# Patient Record
Sex: Male | Born: 1955
Health system: Southern US, Community
[De-identification: ages and names within clinical notes are randomized; demographics above are authoritative.]

## PROBLEM LIST (undated history)

## (undated) DIAGNOSIS — Z9889 Other specified postprocedural states: Secondary | ICD-10-CM

## (undated) DIAGNOSIS — I10 Essential (primary) hypertension: Secondary | ICD-10-CM

## (undated) DIAGNOSIS — E7800 Pure hypercholesterolemia, unspecified: Secondary | ICD-10-CM

## (undated) DIAGNOSIS — F329 Major depressive disorder, single episode, unspecified: Secondary | ICD-10-CM

## (undated) DIAGNOSIS — Z8701 Personal history of pneumonia (recurrent): Secondary | ICD-10-CM

## (undated) DIAGNOSIS — S3993XA Unspecified injury of pelvis, initial encounter: Secondary | ICD-10-CM

## (undated) DIAGNOSIS — Z8781 Personal history of (healed) traumatic fracture: Secondary | ICD-10-CM

## (undated) DIAGNOSIS — F32A Depression, unspecified: Secondary | ICD-10-CM

## (undated) DIAGNOSIS — G4730 Sleep apnea, unspecified: Secondary | ICD-10-CM

## (undated) HISTORY — PX: FRACTURE SURGERY: SHX138

## (undated) HISTORY — DX: Sleep apnea, unspecified: G47.30

## (undated) HISTORY — DX: Major depressive disorder, single episode, unspecified: F32.9

## (undated) HISTORY — DX: Unspecified injury of pelvis, initial encounter: S39.93XA

## (undated) HISTORY — DX: Other specified postprocedural states: Z98.890

## (undated) HISTORY — DX: Pure hypercholesterolemia, unspecified: E78.00

## (undated) HISTORY — DX: Personal history of pneumonia (recurrent): Z87.01

## (undated) HISTORY — DX: Depression, unspecified: F32.A

## (undated) HISTORY — DX: Personal history of (healed) traumatic fracture: Z87.81

## (undated) HISTORY — DX: Essential (primary) hypertension: I10

## (undated) HISTORY — PX: JOINT REPLACEMENT: SHX530

---
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
## 1976-12-07 HISTORY — PX: KNEE SURGERY: SHX244

## 1995-12-08 DIAGNOSIS — S3993XA Unspecified injury of pelvis, initial encounter: Secondary | ICD-10-CM

## 1995-12-08 HISTORY — DX: Unspecified injury of pelvis, initial encounter: S39.93XA

## 1996-12-07 DIAGNOSIS — Z8701 Personal history of pneumonia (recurrent): Secondary | ICD-10-CM

## 1996-12-07 HISTORY — DX: Personal history of pneumonia (recurrent): Z87.01

## 1998-12-07 HISTORY — PX: HAND SURGERY: SHX662

## 2005-12-07 HISTORY — PX: KNEE SURGERY: SHX244

## 2011-12-08 DIAGNOSIS — Z9889 Other specified postprocedural states: Secondary | ICD-10-CM

## 2011-12-08 HISTORY — DX: Other specified postprocedural states: Z98.890

## 2012-05-07 HISTORY — PX: COLONOSCOPY: SHX174

## 2015-10-09 ENCOUNTER — Ambulatory Visit (INDEPENDENT_AMBULATORY_CARE_PROVIDER_SITE_OTHER): Payer: BLUE CROSS/BLUE SHIELD | Admitting: Family Medicine

## 2015-10-09 VITALS — BP 122/78 | HR 79 | Temp 97.9°F | Resp 18 | Ht 78.0 in | Wt 353.0 lb

## 2015-10-09 DIAGNOSIS — S161XXA Strain of muscle, fascia and tendon at neck level, initial encounter: Secondary | ICD-10-CM | POA: Diagnosis not present

## 2015-10-09 DIAGNOSIS — R6889 Other general symptoms and signs: Secondary | ICD-10-CM | POA: Diagnosis not present

## 2015-10-09 DIAGNOSIS — L750 Bromhidrosis: Secondary | ICD-10-CM

## 2015-10-09 NOTE — Progress Notes (Signed)
Subjective:    Patient ID: Jason Ingram, male    DOB: 01-25-1956, 59 y.o.   MRN: [id number] This chart was scribed for Jason Staggers, MD by Jolene Provost, Medical Scribe. This patient was seen in Room 10 and the patient's care was started a 3:11 PM.  Chief Complaint  Patient presents with  . Personal Problem    wife has issues with bv and would like him to be checked her doctor suggested that he be seen    HPI HPI Comments: Jason Ingram is a 59 y.o. male who presents to [facility] complaining of possible STI exposure. He states his wife has been complaining of recurrent bacterial vaginosis with associated increased vaginal smell. She has also told him she has noticed a strong smell coming from his genitals. He uses axe body wash on a washcloth. He states he has been shaving his genitals to try to reduce smell. He also states that his wife has had some vaginal tearing when they have intercourse. He denies past hx of STIs. He denies current penile discharge, penile pain, burning with urination, abdominal pain, rashes or any other new sx.   He also states he has occasional urine leakage on the way to the bathroom. He denies any stool incontinence.   He also states he has been having some improving right neck pain for the last four days. He states he lifted a heavy chair onto his head four days ago and noticed the pain that evening.   There are no active problems to display for this patient.  Past Medical History  Diagnosis Date  . Depression   . Hypertension    Past Surgical History  Procedure Laterality Date  . Fracture surgery     No Known Allergies Prior to Admission medications   Medication Sig Start Date End Date Taking? Authorizing Provider  benzonatate (TESSALON) 200 MG capsule Take 200 mg by mouth daily.   Yes Historical Provider, MD  cloNIDine (CATAPRES) 0.3 MG tablet Take 0.3 mg by mouth daily.   Yes Historical Provider, MD  PARoxetine (PAXIL) 20 MG tablet Take 20 mg by mouth  daily.   Yes Historical Provider, MD  simvastatin (ZOCOR) 40 MG tablet Take 40 mg by mouth daily at 6 PM.   Yes Historical Provider, MD  tamsulosin (FLOMAX) 0.4 MG CAPS capsule Take 0.4 mg by mouth daily.   Yes Historical Provider, MD  testosterone (ANDROGEL) 50 MG/5GM (1%) GEL Place 5 g onto the skin daily.   Yes Historical Provider, MD  triamterene-hydrochlorothiazide (DYAZIDE) 37.5-25 MG capsule Take 1 capsule by mouth daily.   Yes Historical Provider, MD   Social History   Social History  . Marital Status: Married    Spouse Name: N/A  . Number of Children: N/A  . Years of Education: N/A   Occupational History  . Not on file.   Social History Main Topics  . Smoking status: Never Smoker   . Smokeless tobacco: Not on file  . Alcohol Use: 0.6 oz/week    1 Standard drinks or equivalent per week  . Drug Use: No  . Sexual Activity: Not on file   Other Topics Concern  . Not on file   Social History Narrative  . No narrative on file    Review of Systems  Constitutional: Negative for fever and chills.  Genitourinary: Negative for dysuria, urgency, frequency, discharge, penile swelling and penile pain.  Musculoskeletal: Positive for neck pain and neck stiffness.  Objective:   Physical Exam  Constitutional: He is oriented to person, place, and time. He appears well-developed and well-nourished. No distress.  HENT:  Head: Normocephalic and atraumatic.  Eyes: Pupils are equal, round, and reactive to light.  Neck: Neck supple.  Cardiovascular: Normal rate.   Pulmonary/Chest: Effort normal. No respiratory distress.  Genitourinary:  Shaved genitals, no external rash. No hyperpigmentation or rash in genital folds. There was a faint stool stain on underwear.  Musculoskeletal: Normal range of motion.  Neurological: He is alert and oriented to person, place, and time. Coordination normal.  Reflexes 2+ in biceps, triceps and brachioradialis. Strength 5/5 in bilateral hands.  Mild TTP along upper right trapezius. Slightly decreased rotation and lateral flexion in right neck.   Skin: Skin is warm and dry. He is not diaphoretic.  Psychiatric: He has a normal mood and affect. His behavior is normal.  Nursing note and vitals reviewed.   Filed Vitals:   10/09/15 1457  BP: 122/78  Pulse: 79  Temp: 97.9 F (36.6 C)  TempSrc: Oral  Resp: 18  Height: 6\' 6"  (1.981 m)  Weight: 353 lb (160.12 kg)  SpO2: 98%      Assessment & Plan:   Jason Ingram is a 59 y.o. male Strain of neck muscle, initial encounter  - no weakness, improving. No midline bony ttp. XR deferred at present. Sx care discussed, ROM, and handout given.   -rtc precautions   Abnormal body odor  - no concerning findings on exam.  May be a component of sweating in area throughout day and possibly some retained stool on skin.   -trial of change to milder soap such as Dove for Men, can change underwear during the day if needed, and can try flushable wipes to lessen retained stool on skin.   -rtc to look into other causes if persistent, and can discuss with urologist as well.   Meds ordered this encounter  Medications  . testosterone (ANDROGEL) 50 MG/5GM (1%) GEL    Sig: Place 5 g onto the skin daily.  . simvastatin (ZOCOR) 40 MG tablet    Sig: Take 40 mg by mouth daily at 6 PM.  . cloNIDine (CATAPRES) 0.3 MG tablet    Sig: Take 0.3 mg by mouth daily.  . tamsulosin (FLOMAX) 0.4 MG CAPS capsule    Sig: Take 0.4 mg by mouth daily.  Marland Kitchen. triamterene-hydrochlorothiazide (DYAZIDE) 37.5-25 MG capsule    Sig: Take 1 capsule by mouth daily.  . benzonatate (TESSALON) 200 MG capsule    Sig: Take 200 mg by mouth daily.  Marland Kitchen. PARoxetine (PAXIL) 20 MG tablet    Sig: Take 20 mg by mouth daily.   Patient Instructions  I do not see any concerns on your exam today.  You can try to change to [facility] to see if that helps, and use cleanings wipe after bowel movement. If you notice sweating throughout the day, can  try to change underwear once during the day to see if this helps. Return to the clinic or go to the nearest emergency room if any of your symptoms worsen or new symptoms occur.  For your neck - ibuprofen is ok short term, heat and gentle stretches/exercises. Recheck if not improving in next week or two.   Return to the clinic or go to the nearest emergency room if any of your symptoms worsen or new symptoms occur.  Cervical Sprain A cervical sprain is an injury in the neck in which the strong,  fibrous tissues (ligaments) that connect your neck bones stretch or tear. Cervical sprains can range from mild to severe. Severe cervical sprains can cause the neck vertebrae to be unstable. This can lead to damage of the spinal cord and can result in serious nervous system problems. The amount of time it takes for a cervical sprain to get better depends on the cause and extent of the injury. Most cervical sprains heal in 1 to 3 weeks. CAUSES  Severe cervical sprains may be caused by:   Contact sport injuries (such as from football, rugby, wrestling, hockey, auto racing, gymnastics, diving, martial arts, or boxing).   Motor vehicle collisions.   Whiplash injuries. This is an injury from a sudden forward and backward whipping movement of the head and neck.  Falls.  Mild cervical sprains may be caused by:   Being in an awkward position, such as while cradling a telephone between your ear and shoulder.   Sitting in a chair that does not offer proper support.   Working at a poorly Marketing executive station.   Looking up or down for long periods of time.  SYMPTOMS   Pain, soreness, stiffness, or a burning sensation in the front, back, or sides of the neck. This discomfort may develop immediately after the injury or slowly, 24 hours or more after the injury.   Pain or tenderness directly in the middle of the back of the neck.   Shoulder or upper back pain.   Limited ability to move the  neck.   Headache.   Dizziness.   Weakness, numbness, or tingling in the hands or arms.   Muscle spasms.   Difficulty swallowing or chewing.   Tenderness and swelling of the neck.  DIAGNOSIS  Most of the time your health care provider can diagnose a cervical sprain by taking your history and doing a physical exam. Your health care provider will ask about previous neck injuries and any known neck problems, such as arthritis in the neck. X-rays may be taken to find out if there are any other problems, such as with the bones of the neck. Other tests, such as a CT scan or MRI, may also be needed.  TREATMENT  Treatment depends on the severity of the cervical sprain. Mild sprains can be treated with rest, keeping the neck in place (immobilization), and pain medicines. Severe cervical sprains are immediately immobilized. Further treatment is done to help with pain, muscle spasms, and other symptoms and may include:  Medicines, such as pain relievers, numbing medicines, or muscle relaxants.   Physical therapy. This may involve stretching exercises, strengthening exercises, and posture training. Exercises and improved posture can help stabilize the neck, strengthen muscles, and help stop symptoms from returning.  HOME CARE INSTRUCTIONS   Put ice on the injured area.   Put ice in a plastic bag.   Place a towel between your skin and the bag.   Leave the ice on for 15-20 minutes, 3-4 times a day.   If your injury was severe, you may have been given a cervical collar to wear. A cervical collar is a two-piece collar designed to keep your neck from moving while it heals.  Do not remove the collar unless instructed by your health care provider.  If you have long hair, keep it outside of the collar.  Ask your health care provider before making any adjustments to your collar. Minor adjustments may be required over time to improve comfort and reduce pressure on your chin  or on the back  of your head.  Ifyou are allowed to remove the collar for cleaning or bathing, follow your health care provider's instructions on how to do so safely.  Keep your collar clean by wiping it with mild soap and water and drying it completely. If the collar you have been given includes removable pads, remove them every 1-2 days and hand wash them with soap and water. Allow them to air dry. They should be completely dry before you wear them in the collar.  If you are allowed to remove the collar for cleaning and bathing, wash and dry the skin of your neck. Check your skin for irritation or sores. If you see any, tell your health care provider.  Do not drive while wearing the collar.   Only take over-the-counter or prescription medicines for pain, discomfort, or fever as directed by your health care provider.   Keep all follow-up appointments as directed by your health care provider.   Keep all physical therapy appointments as directed by your health care provider.   Make any needed adjustments to your workstation to promote good posture.   Avoid positions and activities that make your symptoms worse.   Warm up and stretch before being active to help prevent problems.  SEEK MEDICAL CARE IF:   Your pain is not controlled with medicine.   You are unable to decrease your pain medicine over time as planned.   Your activity level is not improving as expected.  SEEK IMMEDIATE MEDICAL CARE IF:   You develop any bleeding.  You develop stomach upset.  You have signs of an allergic reaction to your medicine.   Your symptoms get worse.   You develop new, unexplained symptoms.   You have numbness, tingling, weakness, or paralysis in any part of your body.  MAKE SURE YOU:   Understand these instructions.  Will watch your condition.  Will get help right away if you are not doing well or get worse.   This information is not intended to replace advice given to you by your  health care provider. Make sure you discuss any questions you have with your health care provider.   Document Released: 09/20/2007 Document Revised: 11/28/2013 Document Reviewed: 05/31/2013 Elsevier Interactive Patient Education [employer]     I personally performed the services described in this documentation, which was scribed in my presence. The recorded information has been reviewed and considered, and addended by me as needed.   By signing my name below, I, Javier Docker, attest that this documentation has been prepared under the direction and in the presence of Jason Staggers, MD. Electronically Signed: Javier Docker, ER Scribe. 10/09/2015. 3:12 PM.

## 2015-10-09 NOTE — Addendum Note (Signed)
Addended by: Meredith StaggersGREENE, Anvay Tennis R on: 10/09/2015 03:46 PM   Modules accepted: Level of Service

## 2015-10-09 NOTE — Patient Instructions (Signed)
I do not see any concerns on your exam today.  You can try to change to [facility] to see if that helps, and use cleanings wipe after bowel movement. If you notice sweating throughout the day, can try to change underwear once during the day to see if this helps. Return to the clinic or go to the nearest emergency room if any of your symptoms worsen or new symptoms occur.  For your neck - ibuprofen is ok short term, heat and gentle stretches/exercises. Recheck if not improving in next week or two.   Return to the clinic or go to the nearest emergency room if any of your symptoms worsen or new symptoms occur.  Cervical Sprain A cervical sprain is an injury in the neck in which the strong, fibrous tissues (ligaments) that connect your neck bones stretch or tear. Cervical sprains can range from mild to severe. Severe cervical sprains can cause the neck vertebrae to be unstable. This can lead to damage of the spinal cord and can result in serious nervous system problems. The amount of time it takes for a cervical sprain to get better depends on the cause and extent of the injury. Most cervical sprains heal in 1 to 3 weeks. CAUSES  Severe cervical sprains may be caused by:   Contact sport injuries (such as from football, rugby, wrestling, hockey, auto racing, gymnastics, diving, martial arts, or boxing).   Motor vehicle collisions.   Whiplash injuries. This is an injury from a sudden forward and backward whipping movement of the head and neck.  Falls.  Mild cervical sprains may be caused by:   Being in an awkward position, such as while cradling a telephone between your ear and shoulder.   Sitting in a chair that does not offer proper support.   Working at a poorly Marketing executivedesigned computer station.   Looking up or down for long periods of time.  SYMPTOMS   Pain, soreness, stiffness, or a burning sensation in the front, back, or sides of the neck. This discomfort may develop immediately  after the injury or slowly, 24 hours or more after the injury.   Pain or tenderness directly in the middle of the back of the neck.   Shoulder or upper back pain.   Limited ability to move the neck.   Headache.   Dizziness.   Weakness, numbness, or tingling in the hands or arms.   Muscle spasms.   Difficulty swallowing or chewing.   Tenderness and swelling of the neck.  DIAGNOSIS  Most of the time your health care provider can diagnose a cervical sprain by taking your history and doing a physical exam. Your health care provider will ask about previous neck injuries and any known neck problems, such as arthritis in the neck. X-rays may be taken to find out if there are any other problems, such as with the bones of the neck. Other tests, such as a CT scan or MRI, may also be needed.  TREATMENT  Treatment depends on the severity of the cervical sprain. Mild sprains can be treated with rest, keeping the neck in place (immobilization), and pain medicines. Severe cervical sprains are immediately immobilized. Further treatment is done to help with pain, muscle spasms, and other symptoms and may include:  Medicines, such as pain relievers, numbing medicines, or muscle relaxants.   Physical therapy. This may involve stretching exercises, strengthening exercises, and posture training. Exercises and improved posture can help stabilize the neck, strengthen muscles, and help  stop symptoms from returning.  HOME CARE INSTRUCTIONS   Put ice on the injured area.   Put ice in a plastic bag.   Place a towel between your skin and the bag.   Leave the ice on for 15-20 minutes, 3-4 times a day.   If your injury was severe, you may have been given a cervical collar to wear. A cervical collar is a two-piece collar designed to keep your neck from moving while it heals.  Do not remove the collar unless instructed by your health care provider.  If you have long hair, keep it outside of  the collar.  Ask your health care provider before making any adjustments to your collar. Minor adjustments may be required over time to improve comfort and reduce pressure on your chin or on the back of your head.  Ifyou are allowed to remove the collar for cleaning or bathing, follow your health care provider's instructions on how to do so safely.  Keep your collar clean by wiping it with mild soap and water and drying it completely. If the collar you have been given includes removable pads, remove them every 1-2 days and hand wash them with soap and water. Allow them to air dry. They should be completely dry before you wear them in the collar.  If you are allowed to remove the collar for cleaning and bathing, wash and dry the skin of your neck. Check your skin for irritation or sores. If you see any, tell your health care provider.  Do not drive while wearing the collar.   Only take over-the-counter or prescription medicines for pain, discomfort, or fever as directed by your health care provider.   Keep all follow-up appointments as directed by your health care provider.   Keep all physical therapy appointments as directed by your health care provider.   Make any needed adjustments to your workstation to promote good posture.   Avoid positions and activities that make your symptoms worse.   Warm up and stretch before being active to help prevent problems.  SEEK MEDICAL CARE IF:   Your pain is not controlled with medicine.   You are unable to decrease your pain medicine over time as planned.   Your activity level is not improving as expected.  SEEK IMMEDIATE MEDICAL CARE IF:   You develop any bleeding.  You develop stomach upset.  You have signs of an allergic reaction to your medicine.   Your symptoms get worse.   You develop new, unexplained symptoms.   You have numbness, tingling, weakness, or paralysis in any part of your body.  MAKE SURE YOU:    Understand these instructions.  Will watch your condition.  Will get help right away if you are not doing well or get worse.   This information is not intended to replace advice given to you by your health care provider. Make sure you discuss any questions you have with your health care provider.   Document Released: 09/20/2007 Document Revised: 11/28/2013 Document Reviewed: 05/31/2013 Elsevier Interactive Patient Education [employer]

## 2016-01-31 ENCOUNTER — Encounter: Payer: Self-pay | Admitting: Internal Medicine

## 2016-01-31 ENCOUNTER — Ambulatory Visit (INDEPENDENT_AMBULATORY_CARE_PROVIDER_SITE_OTHER): Payer: BLUE CROSS/BLUE SHIELD | Admitting: Internal Medicine

## 2016-01-31 VITALS — BP 142/102 | HR 64 | Temp 97.6°F | Resp 20 | Ht 77.5 in | Wt 354.4 lb

## 2016-01-31 DIAGNOSIS — E291 Testicular hypofunction: Secondary | ICD-10-CM

## 2016-01-31 DIAGNOSIS — M2550 Pain in unspecified joint: Secondary | ICD-10-CM

## 2016-01-31 DIAGNOSIS — F329 Major depressive disorder, single episode, unspecified: Secondary | ICD-10-CM | POA: Diagnosis not present

## 2016-01-31 DIAGNOSIS — I10 Essential (primary) hypertension: Secondary | ICD-10-CM | POA: Diagnosis not present

## 2016-01-31 DIAGNOSIS — E349 Endocrine disorder, unspecified: Secondary | ICD-10-CM

## 2016-01-31 DIAGNOSIS — Z23 Encounter for immunization: Secondary | ICD-10-CM

## 2016-01-31 DIAGNOSIS — F32A Depression, unspecified: Secondary | ICD-10-CM

## 2016-01-31 MED ORDER — CLONIDINE HCL 0.3 MG PO TABS: 0.3000 mg | ORAL_TABLET | Freq: Every day | ORAL | Status: DC

## 2016-01-31 MED ORDER — ZOSTER VACCINE LIVE 19400 UNT/0.65ML ~~LOC~~ SOLR: 0.6500 mL | Freq: Once | SUBCUTANEOUS | Status: DC

## 2016-01-31 MED ORDER — TRIAMTERENE-HCTZ 37.5-25 MG PO CAPS: 1.0000 | ORAL_CAPSULE | Freq: Every day | ORAL | Status: DC

## 2016-01-31 NOTE — Patient Instructions (Addendum)
Will call with urology referral appt  Get old records  Recommend call to set up psychiatry appt. List of providers given  Follow up with pulmonary as scheduled. Call to be placed on cancellation list  Follow up in 3 mos for routine visit

## 2016-01-31 NOTE — Progress Notes (Signed)
Patient ID: Jason Ingram, male   DOB: 11-Oct-1956, 60 y.o.   MRN: [id number]    Location:    PAM   Place of Service:   OFFICE   Advanced Directive information Does patient have an advance directive?: No, Would patient like information on creating an advanced directive?: Yes - Educational materials given  Chief Complaint  Patient presents with  . Establish Care    New Patient Establish Care  . Medical Management of Chronic Issues  . OTHER    Wife in room with patient  . Immunizations    would like script for Zoster and to get Pnuemonia shot     HPI:  60 yo male seen today as a new pt. He married last yr and relocated to [place] from [place]. Wife c/a his breathing and states he does not take a deep breath but exhales forcefully. No hx asthma but has sleep apnea. He has not seen a sleep specialist in > 31yr. He has an appt with pulmonary in April. He would like a portable machine also as he travels a lot. He has had PFTs in the last year he believes  He was seen by UC and told bradycardia and ECG changes. He refused ED vsit. He has an appt with cardio to eval.  Obesity - currently taking phentermine.   Chronic knee pain - he was told he has bone-on-bone. He would like to lose weight  Multiple joint pain - right shoulder, right ankle feels sore  Depression - takes 1/2 tab paxil due to c/a not being able to lose weight. He has taken med since 1990s. He does have anger outbursts. He would like to see psychiatrist.   Testosterone deficiency - uses androgel. He c/o ED sx's.  Followed by urology in [place] but he would like to transfer care to [facility] provider  Currently employed as an Art gallery manager, bldg bunkers for [employer]  Past Medical History  Diagnosis Date  . Depression   . Hypertension   . Sleep apnea   . High cholesterol   . History of pneumonia 1998  . History of broken nose   . Injury of pelvis 1997  . Hx of colonoscopy 2013    Past  Surgical History  Procedure Laterality Date  . Fracture surgery    . Knee surgery  12/07/1976    [facility]  . Hand surgery  2000    Thumb Repair  . Skin graft  1962    [facility]   . Knee surgery  2007    Patient Care Team: Kirt Boys, DO as PCP - General (Internal Medicine) Coralyn Helling, MD as Consulting Physician (Pulmonary Disease)  Social History   Social History  . Marital Status: Married    Spouse Name: N/A  . Number of Children: N/A  . Years of Education: N/A   Occupational History  . Not on file.   Social History Main Topics  . Smoking status: Never Smoker   . Smokeless tobacco: Never Used  . Alcohol Use: 0.6 oz/week    1 Standard drinks or equivalent per week     Comment: 2 drinks weekly  . Drug Use: No  . Sexual Activity: Not on file   Other Topics Concern  . Not on file   Social History Narrative   Diet: N/A   Caffeine: Yes    Married: Yes, 2016   House: House, 2 stories    Pets: 2 Dogs   Current/Past profession: Building services engineer  Exercise: Yes, daily   Living Will: No   DNR: No   POA/HPOA: No         reports that he has never smoked. He has never used smokeless tobacco. He reports that he drinks about 0.6 oz of alcohol per week. He reports that he does not use illicit drugs.  Family History  Problem Relation Age of Onset  . Bipolar disorder Son    Family Status  Relation Status Death Age  . Mother Deceased   . Father Deceased   . Sister Alive   . Daughter Alive   . Son Alive     Immunization History  Administered Date(s) Administered  . Influenza-Unspecified 11/07/2015  . Pneumococcal Conjugate-13 01/31/2016    No Known Allergies  Medications: Patient's Medications  New Prescriptions   No medications on file  Previous Medications   IBUPROFEN-FAMOTIDINE (DUEXIS) 800-26.6 MG TABS    Take by mouth. Take 1 tablet by mouth every 8 hours as needed   MULTIPLE VITAMINS-MINERALS (CENTRUM SILVER ADULT 50+) TABS    Take by  mouth daily.   PAROXETINE (PAXIL) 20 MG TABLET    Take 20 mg by mouth daily.   PHENTERMINE 37.5 MG CAPSULE    Take 37.5 mg by mouth every morning.   SIMVASTATIN (ZOCOR) 40 MG TABLET    Take 40 mg by mouth at bedtime. For cholesterol   TAMSULOSIN (FLOMAX) 0.4 MG CAPS CAPSULE    Take 0.4 mg by mouth daily.   TESTOSTERONE (ANDROGEL) 20.25 MG/1.25GM (1.62%) GEL    Place onto the skin. Use once daily   ZOSTER VACCINE LIVE, PF, (ZOSTAVAX) 69629 UNT/0.65ML INJECTION    Inject 0.65 mLs into the skin once.  Modified Medications   Modified Medication Previous Medication   CLONIDINE (CATAPRES) 0.3 MG TABLET cloNIDine (CATAPRES) 0.3 MG tablet      Take 1 tablet (0.3 mg total) by mouth daily.    Take 0.3 mg by mouth daily.   TRIAMTERENE-HYDROCHLOROTHIAZIDE (DYAZIDE) 37.5-25 MG CAPSULE triamterene-hydrochlorothiazide (DYAZIDE) 37.5-25 MG capsule      Take 1 each (1 capsule total) by mouth daily.    Take 1 capsule by mouth daily.  Discontinued Medications   No medications on file    Review of Systems  Constitutional: Positive for fatigue.  Respiratory: Positive for apnea and wheezing.   Musculoskeletal: Positive for arthralgias and gait problem.  Psychiatric/Behavioral: Positive for sleep disturbance and dysphoric mood.  All other systems reviewed and are negative.   Filed Vitals:   01/31/16 1054  BP: 142/102  Pulse: 64  Temp: 97.6 F (36.4 C)  TempSrc: Oral  Resp: 20  Height: 6' 5.5" (1.969 m)  Weight: 354 lb 6.4 oz (160.755 kg)  SpO2: 96%   Body mass index is 41.46 kg/(m^2).  Physical Exam  Constitutional: He is oriented to person, place, and time. He appears well-developed and well-nourished.  HENT:  Mouth/Throat: Oropharynx is clear and moist.  Eyes: Pupils are equal, round, and reactive to light. No scleral icterus.  Neck: Neck supple. Carotid bruit is not present. No thyromegaly present.  Cardiovascular: Normal rate, regular rhythm, normal heart sounds and intact distal pulses.   Exam reveals no gallop and no friction rub.   No murmur heard. no distal LE swelling. No calf TTP  Pulmonary/Chest: Effort normal and breath sounds normal. He has no wheezes. He has no rales. He exhibits no tenderness.  Abdominal: Soft. Bowel sounds are normal. He exhibits no distension, no abdominal bruit, no pulsatile midline mass  and no mass. There is no tenderness. There is no rebound and no guarding.  Lymphadenopathy:    He has no cervical adenopathy.  Neurological: He is alert and oriented to person, place, and time. He has normal reflexes.  Skin: Skin is warm and dry. No rash noted.  Psychiatric: He has a normal mood and affect. His behavior is normal. Judgment and thought content normal.     Labs reviewed: No results found for any previous visit.  No results found.   Assessment/Plan   ICD-9-CM ICD-10-CM   1. Essential hypertension- suboptimally controlled 401.9 I10   2. Morbid obesity due to excess calories (HCC) 278.01 E66.01   3. Depression 311 F32.9   4. Testosterone deficiency 257.2 E29.1 Ambulatory referral to Urology  5. Pain, joint, multiple sites 719.49 M25.50    Will call with urology referral appt  Recommend stop phentermine as it may increase BP. He is not interested in seeing bariatric specialist at this time  Get old records  Recommend call to set up psychiatry appt. List of providers given  Follow up with pulmonary as scheduled. Call to be placed on cancellation list  Follow up in 3 mos for routine visit  Aubrianne Molyneux S. Ancil Linsey  [facility] [address] [place], [place] [address] [phone] Cell (Monday-Friday 8 AM - 5 PM) [phone] After 5 PM and follow prompts

## 2016-03-25 ENCOUNTER — Institutional Professional Consult (permissible substitution): Payer: Self-pay | Admitting: Pulmonary Disease

## 2016-05-01 ENCOUNTER — Ambulatory Visit: Payer: BLUE CROSS/BLUE SHIELD | Admitting: Internal Medicine

## 2016-05-08 ENCOUNTER — Ambulatory Visit (INDEPENDENT_AMBULATORY_CARE_PROVIDER_SITE_OTHER): Payer: BLUE CROSS/BLUE SHIELD | Admitting: Internal Medicine

## 2016-05-08 ENCOUNTER — Encounter: Payer: Self-pay | Admitting: Internal Medicine

## 2016-05-08 VITALS — BP 140/90 | HR 73 | Temp 97.8°F | Ht 78.0 in | Wt 338.6 lb

## 2016-05-08 DIAGNOSIS — M25569 Pain in unspecified knee: Secondary | ICD-10-CM | POA: Diagnosis not present

## 2016-05-08 DIAGNOSIS — M2550 Pain in unspecified joint: Secondary | ICD-10-CM | POA: Diagnosis not present

## 2016-05-08 DIAGNOSIS — F329 Major depressive disorder, single episode, unspecified: Secondary | ICD-10-CM

## 2016-05-08 DIAGNOSIS — I10 Essential (primary) hypertension: Secondary | ICD-10-CM | POA: Diagnosis not present

## 2016-05-08 DIAGNOSIS — F32A Depression, unspecified: Secondary | ICD-10-CM

## 2016-05-08 MED ORDER — IBUPROFEN-FAMOTIDINE 800-26.6 MG PO TABS: 1.0000 | ORAL_TABLET | Freq: Three times a day (TID) | ORAL | Status: DC | PRN

## 2016-05-08 MED ORDER — PHENTERMINE HCL 37.5 MG PO CAPS: 37.5000 mg | ORAL_CAPSULE | ORAL | Status: DC

## 2016-05-08 NOTE — Patient Instructions (Signed)
Continue current medications as ordered  Follow up with specialists as scheduled  Will call with lab results  Follow up in 3 mos for routine visit 

## 2016-05-08 NOTE — Progress Notes (Signed)
Patient ID: Teagon Kron, male   DOB: Dec 15, 1955, 60 y.o.   MRN: [id number]    Location:  PAM Place of Service: OFFICE  Chief Complaint  Patient presents with  . Medical Management of Chronic Issues    3 month follow up    HPI:  60 yo male seen today for f/u. He has been traveling a lot across the country and plans to go to [place] in next few weeks  Obesity - currently taking phentermine. He would like to go to a diet center in the [place] area. Weight down 16 lbs since last OV  Chronic knee pain - he was told he has bone-on-bone. He would like to lose weight before considering sx. He takes ibuprofen-famotidine prn. Needs RF  Multiple joint pain - right shoulder, right ankle feels sore, knee pain  Depression - takes 1/2 tab paxil due to c/a not being able to lose weight. He has taken med since 1990s. He does have anger outbursts. He has an appt with psych to discuss switching med to one that causes less weight gain  Testosterone deficiency - uses androgel. He c/o ED sx's.  Followed by urology in [place]. 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 He uses CPAP qhs   Past Medical History  Diagnosis Date  . Depression   . Hypertension   . Sleep apnea   . High cholesterol   . History of pneumonia 1998  . History of broken nose   . Injury of pelvis 1997  . Hx of colonoscopy 2013    Past Surgical History  Procedure Laterality Date  . Fracture surgery    . Knee surgery  12/07/1976    [facility]  . Hand surgery  2000    Thumb Repair  . Skin graft  1962    [facility]   . Knee surgery  2007  . Colonoscopy  05/2012    Dr. Carlisle Cater    Patient Care Team: Kirt Boys, DO as PCP - General (Internal Medicine) Coralyn Helling, MD as Consulting Physician (Pulmonary Disease)  Social History   Social History  . Marital Status: Married      Spouse Name: N/A  . Number of Children: N/A  . Years of Education: N/A   Occupational History  . Not on file.   Social History Main Topics  . Smoking status: Never Smoker   . Smokeless tobacco: Never Used  . Alcohol Use: 0.6 oz/week    1 Standard drinks or equivalent per week     Comment: 2 drinks weekly  . Drug Use: No  . Sexual Activity: Not on file   Other Topics Concern  . Not on file   Social History Narrative   Diet: N/A   Caffeine: Yes    Married: Yes, 2016   House: House, 2 stories    Pets: 2 Dogs   Current/Past profession: Building services engineer    Exercise: Yes, daily   Living Will: No   DNR: No   POA/HPOA: No         reports that he has never smoked. He has never used smokeless tobacco. He reports that he drinks about 0.6 oz of alcohol per week. He reports that he does not use illicit drugs.  Family History  Problem Relation Age of Onset  . Bipolar disorder Son  Family Status  Relation Status Death Age  . Mother Deceased   . Father Deceased   . Sister Alive   . Daughter Alive   . Son Alive      No Known Allergies  Medications: Patient's Medications  New Prescriptions   No medications on file  Previous Medications   CLONIDINE (CATAPRES) 0.3 MG TABLET    Take 1 tablet (0.3 mg total) by mouth daily.   IBUPROFEN-FAMOTIDINE (DUEXIS) 800-26.6 MG TABS    Take by mouth. Take 1 tablet by mouth every 8 hours as needed   MULTIPLE VITAMINS-MINERALS (CENTRUM SILVER ADULT 50+) TABS    Take by mouth daily.   PAROXETINE (PAXIL) 20 MG TABLET    Take 20 mg by mouth daily.   PHENTERMINE 37.5 MG CAPSULE    Take 37.5 mg by mouth every morning.   SIMVASTATIN (ZOCOR) 40 MG TABLET    Take 40 mg by mouth at bedtime. For cholesterol   TAMSULOSIN (FLOMAX) 0.4 MG CAPS CAPSULE    Take 0.4 mg by mouth daily.   TESTOSTERONE (ANDROGEL) 20.25 MG/1.25GM (1.62%) GEL    Place onto the skin. Use once daily   TRIAMTERENE-HYDROCHLOROTHIAZIDE (DYAZIDE) 37.5-25 MG CAPSULE     Take 1 each (1 capsule total) by mouth daily.  Modified Medications   No medications on file  Discontinued Medications   ZOSTER VACCINE LIVE, PF, (ZOSTAVAX) 1610919400 UNT/0.65ML INJECTION    Inject 19,400 Units into the skin once.    Review of Systems  Constitutional: Positive for fatigue.  Respiratory: Positive for apnea and wheezing.   Musculoskeletal: Positive for arthralgias and gait problem.  Psychiatric/Behavioral: Positive for sleep disturbance and dysphoric mood.  All other systems reviewed and are negative.   Filed Vitals:   05/08/16 1526  BP: 140/90  Pulse: 73  Temp: 97.8 F (36.6 C)  TempSrc: Oral  Height: 6\' 6"  (1.981 m)  Weight: 338 lb 9.6 oz (153.588 kg)  SpO2: 97%   Body mass index is 39.14 kg/(m^2).  Physical Exam  Constitutional: He is oriented to person, place, and time. He appears well-developed and well-nourished.  HENT:  Mouth/Throat: Oropharynx is clear and moist.  Eyes: Pupils are equal, round, and reactive to light. No scleral icterus.  Neck: Neck supple. Carotid bruit is not present. No thyromegaly present.  Cardiovascular: Normal rate, regular rhythm, normal heart sounds and intact distal pulses.  Exam reveals no gallop and no friction rub.   No murmur heard. no distal LE swelling. No calf TTP  Pulmonary/Chest: Effort normal and breath sounds normal. He has no wheezes. He has no rales. He exhibits no tenderness.  Abdominal: Soft. Bowel sounds are normal. He exhibits no distension, no abdominal bruit, no pulsatile midline mass and no mass. There is no tenderness. There is no rebound and no guarding.  Musculoskeletal: He exhibits edema and tenderness.  Lymphadenopathy:    He has no cervical adenopathy.  Neurological: He is alert and oriented to person, place, and time. He has normal reflexes.  Skin: Skin is warm and dry. No rash noted.  Psychiatric: He has a normal mood and affect. His behavior is normal. Judgment and thought content normal.      Labs reviewed: No results found for any previous visit.  No results found.   Assessment/Plan   ICD-9-CM ICD-10-CM   1. Knee pain, unspecified laterality 719.46 M25.569 Ibuprofen-Famotidine (DUEXIS) 800-26.6 MG TABS  2. Morbid obesity due to excess calories (HCC) 278.01 E66.01 phentermine 37.5 MG capsule  3. Essential hypertension  401.9 I10 CMP  4. Depression 311 F32.9   5. Pain, joint, multiple sites 719.49 M25.50 Ibuprofen-Famotidine (DUEXIS) 800-26.6 MG TABS   Continue current medications as ordered  Follow up with specialists as scheduled  Will call with lab results  Cont diet and exercise routine  Follow up in 3 mos for routine visit   Bobie Caris S. Ancil Linsey  [facility] [address] [place], [place] [address] [phone] Cell (Monday-Friday 8 AM - 5 PM) [phone] After 5 PM and follow prompts

## 2016-05-09 LAB — COMPREHENSIVE METABOLIC PANEL
A/G RATIO: 1.7 (ref 1.2–2.2)
ALT: 19 IU/L (ref 0–44)
AST: 18 IU/L (ref 0–40)
Albumin: 4.4 g/dL (ref 3.6–4.8)
Alkaline Phosphatase: 65 IU/L (ref 39–117)
BUN/Creatinine Ratio: 19 (ref 10–24)
BUN: 17 mg/dL (ref 8–27)
Bilirubin Total: 0.4 mg/dL (ref 0.0–1.2)
CHLORIDE: 98 mmol/L (ref 96–106)
CO2: 27 mmol/L (ref 18–29)
CREATININE: 0.88 mg/dL (ref 0.76–1.27)
Calcium: 9.7 mg/dL (ref 8.6–10.2)
GFR calc non Af Amer: 93 mL/min/{1.73_m2} (ref >59)
GFR, EST AFRICAN AMERICAN: 107 mL/min/{1.73_m2} (ref >59)
GLOBULIN, TOTAL: 2.6 g/dL (ref 1.5–4.5)
Glucose: 90 mg/dL (ref 65–99)
Potassium: 4.4 mmol/L (ref 3.5–5.2)
Sodium: 141 mmol/L (ref 134–144)
Total Protein: 7 g/dL (ref 6.0–8.5)

## 2016-06-01 DIAGNOSIS — F339 Major depressive disorder, recurrent, unspecified: Secondary | ICD-10-CM | POA: Diagnosis not present

## 2016-06-24 ENCOUNTER — Encounter: Payer: Self-pay | Admitting: Pulmonary Disease

## 2016-06-24 ENCOUNTER — Encounter (INDEPENDENT_AMBULATORY_CARE_PROVIDER_SITE_OTHER): Payer: Self-pay

## 2016-06-24 ENCOUNTER — Ambulatory Visit (INDEPENDENT_AMBULATORY_CARE_PROVIDER_SITE_OTHER): Payer: BLUE CROSS/BLUE SHIELD | Admitting: Pulmonary Disease

## 2016-06-24 ENCOUNTER — Other Ambulatory Visit (INDEPENDENT_AMBULATORY_CARE_PROVIDER_SITE_OTHER): Payer: BLUE CROSS/BLUE SHIELD

## 2016-06-24 ENCOUNTER — Ambulatory Visit (INDEPENDENT_AMBULATORY_CARE_PROVIDER_SITE_OTHER)
Admission: RE | Admit: 2016-06-24 | Discharge: 2016-06-24 | Disposition: A | Discharge status: Home / Self Care | Payer: BLUE CROSS/BLUE SHIELD | Source: Ambulatory Visit | Attending: Pulmonary Disease | Admitting: Pulmonary Disease

## 2016-06-24 VITALS — BP 138/86 | HR 60 | Ht 78.0 in | Wt 334.3 lb

## 2016-06-24 DIAGNOSIS — G4733 Obstructive sleep apnea (adult) (pediatric): Secondary | ICD-10-CM

## 2016-06-24 DIAGNOSIS — R0600 Dyspnea, unspecified: Secondary | ICD-10-CM

## 2016-06-24 LAB — COMPREHENSIVE METABOLIC PANEL WITH GFR
ALT: 13 U/L (ref 0–53)
AST: 12 U/L (ref 0–37)
Albumin: 4.1 g/dL (ref 3.5–5.2)
Alkaline Phosphatase: 55 U/L (ref 39–117)
BUN: 24 mg/dL — ABNORMAL HIGH (ref 6–23)
CO2: 33 meq/L — ABNORMAL HIGH (ref 19–32)
Calcium: 9.8 mg/dL (ref 8.4–10.5)
Chloride: 102 meq/L (ref 96–112)
Creatinine, Ser: 1.15 mg/dL (ref 0.40–1.50)
GFR: 68.67 mL/min
Glucose, Bld: 92 mg/dL (ref 70–99)
Potassium: 4.4 meq/L (ref 3.5–5.1)
Sodium: 140 meq/L (ref 135–145)
Total Bilirubin: 0.5 mg/dL (ref 0.2–1.2)
Total Protein: 7.2 g/dL (ref 6.0–8.3)

## 2016-06-24 LAB — CBC
HCT: 46.2 % (ref 39.0–52.0)
Hemoglobin: 15.1 g/dL (ref 13.0–17.0)
MCHC: 32.7 g/dL (ref 30.0–36.0)
MCV: 86.5 fl (ref 78.0–100.0)
Platelets: 272 K/uL (ref 150.0–400.0)
RBC: 5.34 Mil/uL (ref 4.22–5.81)
RDW: 14.5 % (ref 11.5–15.5)
WBC: 6.7 K/uL (ref 4.0–10.5)

## 2016-06-24 LAB — BRAIN NATRIURETIC PEPTIDE: Pro B Natriuretic peptide (BNP): 37 pg/mL (ref 0.0–100.0)

## 2016-06-24 NOTE — Progress Notes (Signed)
   Subjective:    Patient ID: Jason Ingram, male    DOB: Nov 16, 1956, 60 y.o.   MRN: [id number]  HPI    Review of Systems  Constitutional: Negative for fever and unexpected weight change.  HENT: Negative for congestion, dental problem, ear pain, nosebleeds, postnasal drip, rhinorrhea, sinus pressure, sneezing, sore throat and trouble swallowing.   Eyes: Negative for redness and itching.  Respiratory: Positive for shortness of breath. Negative for cough, chest tightness and wheezing.   Cardiovascular: Negative for palpitations and leg swelling.  Gastrointestinal: Negative for nausea and vomiting.  Genitourinary: Negative for dysuria.  Musculoskeletal: Negative for joint swelling.  Skin: Negative for rash.  Neurological: Negative for headaches.  Hematological: Does not bruise/bleed easily.  Psychiatric/Behavioral: Positive for dysphoric mood. The patient is not nervous/anxious.        Objective:   Physical Exam        Assessment & Plan:

## 2016-06-24 NOTE — Progress Notes (Signed)
Past Surgical History He  has past surgical history that includes Fracture surgery; Knee surgery (12/07/1976); Hand surgery (2000); Skin graft (1962); Knee surgery (2007); and Colonoscopy (05/2012).  No Known Allergies  Family History His family history includes Bipolar disorder in his son; Heart disease in his other; Lymphoma in his mother.  Social History He  reports that he has never smoked. He has never used smokeless tobacco. He reports that he drinks about 0.6 oz of alcohol per week. He reports that he does not use illicit drugs.  Review of systems Constitutional: Negative for fever and unexpected weight change.  HENT: Negative for congestion, dental problem, ear pain, nosebleeds, postnasal drip, rhinorrhea, sinus pressure, sneezing, sore throat and trouble swallowing.   Eyes: Negative for redness and itching.  Respiratory: Positive for shortness of breath. Negative for cough, chest tightness and wheezing.   Cardiovascular: Negative for palpitations and leg swelling.  Gastrointestinal: Negative for nausea and vomiting.  Genitourinary: Negative for dysuria.  Musculoskeletal: Negative for joint swelling.  Skin: Negative for rash.  Neurological: Negative for headaches.  Hematological: Does not bruise/bleed easily.  Psychiatric/Behavioral: Positive for dysphoric mood. The patient is not nervous/anxious.     Current Outpatient Prescriptions on File Prior to Visit  Medication Sig  . cloNIDine (CATAPRES) 0.3 MG tablet Take 1 tablet (0.3 mg total) by mouth daily.  . Ibuprofen-Famotidine (DUEXIS) 800-26.6 MG TABS Take 1 tablet by mouth every 8 (eight) hours as needed. Take 1 tablet by mouth every 8 hours as needed  . Multiple Vitamins-Minerals (CENTRUM SILVER ADULT 50+) TABS Take by mouth daily.  Marland Kitchen PARoxetine (PAXIL) 20 MG tablet Take 20 mg by mouth daily.  . phentermine 37.5 MG capsule Take 1 capsule (37.5 mg total) by mouth every morning.  . simvastatin (ZOCOR) 40 MG tablet Take 40 mg by  mouth at bedtime. For cholesterol  . tamsulosin (FLOMAX) 0.4 MG CAPS capsule Take 0.4 mg by mouth daily.  . Testosterone (ANDROGEL) 20.25 MG/1.25GM (1.62%) GEL Place onto the skin. Use once daily  . triamterene-hydrochlorothiazide (DYAZIDE) 37.5-25 MG capsule Take 1 each (1 capsule total) by mouth daily.   No current facility-administered medications on file prior to visit.    Chief Complaint  Patient presents with  . SLEEP CONSULT    Self Referral. Sleep study in [place] 2013. Current CPAP- pressure 10. Pt would like to swtich DME companies to someone in [place]. Current DME: [facility]    Tests: PSG 2005 >> AHI 50 CPAP 05/02/16 to 05/31/16 >> used on 30 of 30 nights with average 10 hrs 15 min.  Average AHI 1.7 with CPAP 10 cm H2O.  Past medical history He  has a past medical history of Depression; Hypertension; Sleep apnea; High cholesterol; History of pneumonia (1998); History of broken nose; Injury of pelvis (1997); and colonoscopy (2013).  Vital signs BP 138/86 mmHg  Pulse 60  Ht  (1.981 m)  Wt 334 lb 4.8 oz (151.637 kg)  BMI 38.64 kg/m2  SpO2 96%  History of Present Illness Jason Ingram is a 60 y.o. male for evaluation of sleep problems.  He was originally dx with OSA in 2005 with sleep study showing severe sleep apnea.  He lost about 60 lbs, and sleep apnea improved.  He regained weight and had another sleep study in [place] >> this showed sleep apnea and he was continued on CPAP.  He has been on CPAP 10 cm H2O.  He has nasal mask.  He moved to [place], and needs  new DME in [place].  He feels CPAP works well and helps his sleep.  He goes to sleep at 10 pm.  He falls asleep after 1 hour.  He wakes up a few times to use the bathroom.  He gets out of bed at 8 am.  He feels rested in the morning.  He denies morning headache.  He does not use anything to help him fall sleep or stay awake.  He denies sleep walking, sleep talking, bruxism, or nightmares.  There is  no history of restless legs.  He denies sleep hallucinations, sleep paralysis, or cataplexy.  The Epworth score is 11 out of 24.  His wife has been concerned about his breathing while awake and asleep.  She feels he is a shallow breather.  He will take big gasps of air in, but then not breath much air out.  He is not aware of this.  He denies chest pain, cough, sputum.  She hears him wheeze when he gets winded, but this gets better after he rests.  He denies leg swelling.  He never smoked cigarettes.  There is no history of asthma, or thrombo-embolic disease.  His wife feels that his breathing was better when he had lost weight.  Physical Exam:  General - No distress ENT - No sinus tenderness, no oral exudate, no LAN, no thyromegaly, TM clear, pupils equal/reactive, MP 3 Cardiac - s1s2 regular, no murmur, pulses symmetric Chest - No wheeze/rales/dullness, good air entry, normal respiratory excursion Back - No focal tenderness Abd - Soft, non-tender, no organomegaly, + bowel sounds Ext - No edema Neuro - Normal strength, cranial nerves intact Skin - No rashes Psych - Normal mood, and behavior  Discussion: He has hx of obstructive sleep apnea, and has done well with CPAP.  He needs new DME set up locally.    His wife has been concerned about his breathing, and shallow breathing both during the day and at night.  Assessment/plan:  Obstructive sleep apnea. - continue CPAP 10 cm H2O - will get copy of his previously sleep study - will arrange [place] new [place] in [place] >> explained he might need repeat sleep study for insurance purposes (could be done with home sleep study) - he travels extensively >> he would like to arrange for mini CPAP once he gets new DME established  Dyspnea. - labs, CXR, PFT - depending on results will determine if additional evaluation is needed  Obesity. - encouraged him to continue with weight loss efforts   Patient Instructions  Chest xray, lab tests  today Will arrange for pulmonary function test Will arrange for overnight oxygen test with CPAP Will arrange for new medical supply company  Follow up in 6 months     Coralyn HellingVineet Noris Kulinski, M.D. Pager [phone] 06/24/2016, 11:15 AM

## 2016-06-24 NOTE — Patient Instructions (Addendum)
Chest xray, lab tests today Will arrange for pulmonary function test Will arrange for overnight oxygen test with CPAP Will arrange for new medical supply company  Follow up in 6 months

## 2016-06-25 ENCOUNTER — Encounter: Payer: Self-pay | Admitting: Pulmonary Disease

## 2016-06-25 ENCOUNTER — Telehealth: Payer: Self-pay | Admitting: Pulmonary Disease

## 2016-06-25 DIAGNOSIS — G4733 Obstructive sleep apnea (adult) (pediatric): Secondary | ICD-10-CM | POA: Insufficient documentation

## 2016-06-25 NOTE — Telephone Encounter (Signed)
Dg Chest 2 View  06/24/2016  CLINICAL DATA:  Dyspnea. EXAM: CHEST  2 VIEW COMPARISON:  None FINDINGS: The heart size and mediastinal contours are within normal limits. Both lungs are clear. The visualized skeletal structures are unremarkable. IMPRESSION: No active cardiopulmonary disease. Electronically Signed   By: Elige KoHetal  Patel   On: 06/24/2016 13:47    CMP Latest Ref Rng 06/24/2016 05/08/2016  Glucose 70 - 99 mg/dL 92 90  BUN 6 - 23 mg/dL 16(X24(H) 17  Creatinine 0.960.40 - 1.50 mg/dL 0.451.15 4.090.88  Sodium 811135 - 145 mEq/L 140 141  Potassium 3.5 - 5.1 mEq/L 4.4 4.4  Chloride 96 - 112 mEq/L 102 98  CO2 19 - 32 mEq/L 33(H) 27  Calcium 8.4 - 10.5 mg/dL 9.8 9.7  Total Protein 6.0 - 8.3 g/dL 7.2 7.0  Total Bilirubin 0.2 - 1.2 mg/dL 0.5 0.4  Alkaline Phos 39 - 117 U/L 55 65  AST 0 - 37 U/L 12 18  ALT 0 - 53 U/L 13 19     CBC Latest Ref Rng 06/24/2016  WBC 4.0 - 10.5 K/uL 6.7  Hemoglobin 13.0 - 17.0 g/dL 91.415.1  Hematocrit 78.239.0 - 52.0 % 46.2  Platelets 150.0 - 400.0 K/uL 272.0    ProBNP (last 3 results)  Recent Labs  06/24/16 1135  PROBNP 37.0    Will have my nurse inform pt that chest xray and lab tests were normal.  Will call back after review of his breathing test.

## 2016-06-25 NOTE — Telephone Encounter (Signed)
Results have been explained to patient, pt expressed understanding. Nothing further needed.  

## 2016-06-30 ENCOUNTER — Other Ambulatory Visit: Payer: Self-pay

## 2016-06-30 MED ORDER — SIMVASTATIN 40 MG PO TABS: 40.0000 mg | ORAL_TABLET | Freq: Every day | ORAL | 1 refills | Status: DC

## 2016-06-30 MED ORDER — PAROXETINE HCL 20 MG PO TABS: 20.0000 mg | ORAL_TABLET | Freq: Every day | ORAL | 1 refills | Status: DC

## 2016-07-10 DIAGNOSIS — M1711 Unilateral primary osteoarthritis, right knee: Secondary | ICD-10-CM | POA: Diagnosis not present

## 2016-07-10 DIAGNOSIS — M1712 Unilateral primary osteoarthritis, left knee: Secondary | ICD-10-CM | POA: Diagnosis not present

## 2016-07-10 DIAGNOSIS — M170 Bilateral primary osteoarthritis of knee: Secondary | ICD-10-CM | POA: Diagnosis not present

## 2016-07-17 DIAGNOSIS — Z029 Encounter for administrative examinations, unspecified: Secondary | ICD-10-CM

## 2016-07-21 DIAGNOSIS — G4733 Obstructive sleep apnea (adult) (pediatric): Secondary | ICD-10-CM | POA: Diagnosis not present

## 2016-07-24 ENCOUNTER — Encounter: Payer: Self-pay | Admitting: Pulmonary Disease

## 2016-07-25 ENCOUNTER — Other Ambulatory Visit: Payer: Self-pay | Admitting: Internal Medicine

## 2016-08-06 DIAGNOSIS — Z125 Encounter for screening for malignant neoplasm of prostate: Secondary | ICD-10-CM | POA: Diagnosis not present

## 2016-08-06 DIAGNOSIS — E291 Testicular hypofunction: Secondary | ICD-10-CM | POA: Diagnosis not present

## 2016-08-06 DIAGNOSIS — R350 Frequency of micturition: Secondary | ICD-10-CM | POA: Diagnosis not present

## 2016-08-11 DIAGNOSIS — M79671 Pain in right foot: Secondary | ICD-10-CM | POA: Diagnosis not present

## 2016-08-11 DIAGNOSIS — M722 Plantar fascial fibromatosis: Secondary | ICD-10-CM | POA: Diagnosis not present

## 2016-08-11 DIAGNOSIS — M7731 Calcaneal spur, right foot: Secondary | ICD-10-CM | POA: Diagnosis not present

## 2016-08-12 ENCOUNTER — Encounter: Payer: Self-pay | Admitting: Internal Medicine

## 2016-08-12 ENCOUNTER — Ambulatory Visit (INDEPENDENT_AMBULATORY_CARE_PROVIDER_SITE_OTHER): Payer: BLUE CROSS/BLUE SHIELD | Admitting: Internal Medicine

## 2016-08-12 ENCOUNTER — Ambulatory Visit: Payer: BLUE CROSS/BLUE SHIELD | Admitting: Internal Medicine

## 2016-08-12 VITALS — BP 118/80 | HR 60 | Temp 98.0°F | Ht 78.0 in | Wt 328.2 lb

## 2016-08-12 DIAGNOSIS — I10 Essential (primary) hypertension: Secondary | ICD-10-CM | POA: Diagnosis not present

## 2016-08-12 DIAGNOSIS — M25569 Pain in unspecified knee: Secondary | ICD-10-CM | POA: Insufficient documentation

## 2016-08-12 DIAGNOSIS — F329 Major depressive disorder, single episode, unspecified: Secondary | ICD-10-CM | POA: Diagnosis not present

## 2016-08-12 DIAGNOSIS — M722 Plantar fascial fibromatosis: Secondary | ICD-10-CM | POA: Diagnosis not present

## 2016-08-12 DIAGNOSIS — E349 Endocrine disorder, unspecified: Secondary | ICD-10-CM | POA: Insufficient documentation

## 2016-08-12 DIAGNOSIS — F32A Depression, unspecified: Secondary | ICD-10-CM | POA: Insufficient documentation

## 2016-08-12 DIAGNOSIS — E291 Testicular hypofunction: Secondary | ICD-10-CM | POA: Diagnosis not present

## 2016-08-12 NOTE — Patient Instructions (Signed)
Continue current medications as ordered  Follow up with sleep specialist. May need repeat sleep study to assess efficacy of current sleep settings  Follow up with Ortho as scheduled  Continue PT as ordered  Follow up in 3 mos for routine visit

## 2016-08-12 NOTE — Progress Notes (Signed)
Patient ID: Jason Ingram, male   DOB: 28-Dec-1955, 60 y.o.   MRN: [id number]    Location:  [facility] Place of Service: OFFICE  Chief Complaint  Patient presents with  . Medical Management of Chronic Issues    3 month follow up    HPI:  60 yo male seen today for f/u. He developed right plantar fasciitis earlier this week and was seen by [facility] yesterday. He had right heel injection and rx topical analgesic. He now is wearing camwalker and started PT today. He is leaving for [place] later this weekend  Obesity - currently off phentermine. He is on strict diet and continues to lose weight. Weight down 6 lbs since last OV and probably more but he was weighed with camwalker. Wife present today  Chronic knee pain - he was told he has bone-on-bone. He would like to lose weight before considering sx. He takes ibuprofen-famotidine prn. Needs RF  Multiple joint pain - right shoulder, right ankle feels sore, knee pain  Depression - takes 1/2 tab paxil due to c/a not being able to lose weight. He has taken med since 1990s. He does have anger outbursts. He has an appt with psych to discuss switching med to one that causes less weight gain  Testosterone deficiency - uses androgel. He c/o ED sx's.  Followed by urology in [place]. [facility] July [facility]  Currently employed as an Art gallery manager, bldg bunkers for [employer]  Snoring/hx OSA - followed by sleep specialist. He uses CPAP qhs   Past Medical History:  Diagnosis Date  . Depression   . High cholesterol   . History of broken nose   . History of pneumonia 1998  . Hx of colonoscopy 2013  . Hypertension   . Injury of pelvis 1997  . Sleep apnea     Past Surgical History:  Procedure Laterality Date  . COLONOSCOPY  05/2012   Dr. Carlisle Cater  . FRACTURE SURGERY    . HAND SURGERY  2000   [facility]  . KNEE SURGERY  12/07/1976   [facility]  . KNEE SURGERY  2007  . SKIN GRAFT  1962   [facility]      Patient Care Team: Kirt Boys, DO as PCP - General (Internal Medicine) Coralyn Helling, MD as Consulting Physician (Pulmonary Disease)  Social History   Social History  . Marital status: Married    Spouse name: N/A  . Number of children: N/A  . Years of education: N/A   Occupational History  . Engineer    Social History Main Topics  . Smoking status: Never Smoker  . Smokeless tobacco: Never Used  . Alcohol use 0.6 oz/week    1 Standard drinks or equivalent per week     Comment: 4 drinks weekly  . Drug use: No  . Sexual activity: Not on file   Other Topics Concern  . Not on file   Social History Narrative   Diet: N/A   Caffeine: Yes    Married: Yes, 2016   House: House, 2 stories    Pets: 2 Dogs   Current/Past profession: Building services engineer    Exercise: Yes, daily   Living Will: No   DNR: No   POA/HPOA: No         reports that he has never smoked. He has never used smokeless tobacco. He reports that he drinks about 0.6 oz of alcohol per week . He reports that he  does not use drugs.  Family History  Problem Relation Age of Onset  . Lymphoma Mother   . Bipolar disorder Son   . Heart disease Other     multiple   Family Status  Relation Status  . Mother Deceased  . Father Deceased  . Sister Alive  . Daughter Alive  . Son Alive  . Son   . Other      No Known Allergies  Medications: Patient's Medications  New Prescriptions   No medications on file  Previous Medications   CLONIDINE (CATAPRES) 0.3 MG TABLET    TAKE 1 TABLET BY MOUTH DAILY   FINASTERIDE (PROSCAR) 5 MG TABLET    Take 5 mg by mouth daily.   IBUPROFEN-FAMOTIDINE (DUEXIS) 800-26.6 MG TABS    Take 1 tablet by mouth every 8 (eight) hours as needed. Take 1 tablet by mouth every 8 hours as needed   MULTIPLE VITAMINS-MINERALS (CENTRUM SILVER ADULT 50+) TABS    Take by mouth daily.   PAROXETINE (PAXIL) 20 MG TABLET    Take 1 tablet (20 mg total) by mouth daily.   SIMVASTATIN (ZOCOR) 40  MG TABLET    Take 1 tablet (40 mg total) by mouth at bedtime. For cholesterol   TRIAMTERENE-HYDROCHLOROTHIAZIDE (DYAZIDE) 37.5-25 MG CAPSULE    Take 1 each (1 capsule total) by mouth daily.  Modified Medications   No medications on file  Discontinued Medications   PHENTERMINE 37.5 MG CAPSULE    Take 1 capsule (37.5 mg total) by mouth every morning.   TAMSULOSIN (FLOMAX) 0.4 MG CAPS CAPSULE    Take 0.4 mg by mouth daily.   TESTOSTERONE (ANDROGEL) 20.25 MG/1.25GM (1.62%) GEL    Place onto the skin. Use once daily    Review of Systems  Constitutional: Positive for fatigue.  Musculoskeletal: Positive for arthralgias and gait problem.  Psychiatric/Behavioral: Positive for dysphoric mood and sleep disturbance.  All other systems reviewed and are negative.   Vitals:   08/12/16 1540  BP: 118/80  Pulse: 60  Temp: 98 F (36.7 C)  TempSrc: Oral  SpO2: 95%  Weight: (!) 328 lb 3.2 oz (148.9 kg)  Height: 6\' 6"  (1.981 m)   Body mass index is 37.93 kg/m.  Physical Exam  Constitutional: He is oriented to person, place, and time. He appears well-developed and well-nourished.  HENT:  Mouth/Throat: Oropharynx is clear and moist.  Eyes: Pupils are equal, round, and reactive to light. No scleral icterus.  Neck: Neck supple. Carotid bruit is not present. No thyromegaly present.  Cardiovascular: Normal rate, regular rhythm, normal heart sounds and intact distal pulses.  Exam reveals no gallop and no friction rub.   No murmur heard. no distal LE swelling. No calf TTP  Pulmonary/Chest: Effort normal and breath sounds normal. He has no wheezes. He has no rales. He exhibits no tenderness.  Abdominal: Soft. Bowel sounds are normal. He exhibits no distension, no abdominal bruit, no pulsatile midline mass and no mass. There is no tenderness. There is no rebound and no guarding.  Musculoskeletal: He exhibits edema and tenderness.  Right camwalker intact  Lymphadenopathy:    He has no cervical  adenopathy.  Neurological: He is alert and oriented to person, place, and time. He has normal reflexes.  Skin: Skin is warm and dry. No rash noted.  Right anterior leg abrasion  Psychiatric: He has a normal mood and affect. His behavior is normal. Judgment and thought content normal.     Labs reviewed: Appointment on 06/24/2016  Component Date Value Ref Range Status  . Sodium 06/24/2016 140  135 - 145 mEq/L Final  . Potassium 06/24/2016 4.4  3.5 - 5.1 mEq/L Final  . Chloride 06/24/2016 102  96 - 112 mEq/L Final  . CO2 06/24/2016 33* 19 - 32 mEq/L Final  . Glucose, Bld 06/24/2016 92  70 - 99 mg/dL Final  . BUN 08/65/784607/19/2017 24* 6 - 23 mg/dL Final  . Creatinine, Ser 06/24/2016 1.15  0.40 - 1.50 mg/dL Final  . Total Bilirubin 06/24/2016 0.5  0.2 - 1.2 mg/dL Final  . Alkaline Phosphatase 06/24/2016 55  39 - 117 U/L Final  . AST 06/24/2016 12  0 - 37 U/L Final  . ALT 06/24/2016 13  0 - 53 U/L Final  . Total Protein 06/24/2016 7.2  6.0 - 8.3 g/dL Final  . Albumin 96/29/528407/19/2017 4.1  3.5 - 5.2 g/dL Final  . Calcium 13/24/401007/19/2017 9.8  8.4 - 10.5 mg/dL Final  . GFR 27/25/366407/19/2017 68.67  >60.00 mL/min Final  . Pro B Natriuretic peptide (BNP) 06/24/2016 37.0  0.0 - 100.0 pg/mL Final  . WBC 06/24/2016 6.7  4.0 - 10.5 K/uL Final  . RBC 06/24/2016 5.34  4.22 - 5.81 Mil/uL Final  . Platelets 06/24/2016 272.0  150.0 - 400.0 K/uL Final  . Hemoglobin 06/24/2016 15.1  13.0 - 17.0 g/dL Final  . HCT 40/34/742507/19/2017 46.2  39.0 - 52.0 % Final  . MCV 06/24/2016 86.5  78.0 - 100.0 fl Final  . MCHC 06/24/2016 32.7  30.0 - 36.0 g/dL Final  . RDW 95/63/875607/19/2017 14.5  11.5 - 15.5 % Final    No results found.   Assessment/Plan   ICD-9-CM ICD-10-CM   1. Plantar fasciitis of right foot 728.71 M72.2   2. Morbid obesity due to excess calories (HCC) 278.01 E66.01   3. Essential hypertension 401.9 I10   4. Depression 311 F32.9   5. Knee pain, unspecified laterality 719.46 M25.569   6. Testosterone deficiency 257.2 E29.1   7.       Snoring/excessive daytime sleepiness likely due to uncontrolled sleep apnea  Wear long sock on right to reduce abrasions to leg and prevent pressure sore from forming  Continue current medications as ordered  Follow up with sleep specialist. May need repeat sleep study to assess efficacy of current sleep settings  Follow up with Ortho as scheduled  Continue PT as ordered  Follow up in 3 mos for routine visit  Carilyn Woolston S. Ancil Linseyarter, D. O., F. A. C. O. I.  [facility] [address] [place], [place] [address] [phone] Cell (Monday-Friday 8 AM - 5 PM) [phone] After 5 PM and follow prompts

## 2016-08-14 DIAGNOSIS — M722 Plantar fascial fibromatosis: Secondary | ICD-10-CM | POA: Diagnosis not present

## 2016-08-14 DIAGNOSIS — E291 Testicular hypofunction: Secondary | ICD-10-CM | POA: Diagnosis not present

## 2016-08-14 DIAGNOSIS — Z125 Encounter for screening for malignant neoplasm of prostate: Secondary | ICD-10-CM | POA: Diagnosis not present

## 2016-08-31 ENCOUNTER — Encounter: Payer: Self-pay | Admitting: Pulmonary Disease

## 2016-08-31 DIAGNOSIS — R0600 Dyspnea, unspecified: Secondary | ICD-10-CM | POA: Diagnosis not present

## 2016-09-02 ENCOUNTER — Telehealth: Payer: Self-pay | Admitting: Pulmonary Disease

## 2016-09-02 ENCOUNTER — Ambulatory Visit: Payer: BLUE CROSS/BLUE SHIELD | Admitting: Internal Medicine

## 2016-09-02 DIAGNOSIS — Z125 Encounter for screening for malignant neoplasm of prostate: Secondary | ICD-10-CM | POA: Diagnosis not present

## 2016-09-02 NOTE — Telephone Encounter (Signed)
Pt called in. Pt reports he took the finger probe off his finger 3 times last night to use the restroom. Each time he put it back on, he placed the finger probe on a different finger. He just wanted to let Dr. Craige CottaSood know in case his readings are inaccurate.  FYI to Dr. Craige CottaSood.

## 2016-09-02 NOTE — Telephone Encounter (Signed)
I spoke with patient about results and he verbalized understanding and had no questions 

## 2016-09-02 NOTE — Telephone Encounter (Signed)
ONO with CPAP 08/31/16 >> test time 7 hrs 39 min.  Baseline SpO2 92.8%, low SpO2 89%.    Will have my nurse inform pt that ONO looked good.  He does not need supplemental oxygen at night.  He needs to continue CPAP at night.  Using different fingers during the night while doing ONO will not have impact on test results.

## 2016-09-08 DIAGNOSIS — F339 Major depressive disorder, recurrent, unspecified: Secondary | ICD-10-CM | POA: Diagnosis not present

## 2016-09-10 DIAGNOSIS — M170 Bilateral primary osteoarthritis of knee: Secondary | ICD-10-CM | POA: Diagnosis not present

## 2016-09-10 DIAGNOSIS — M1712 Unilateral primary osteoarthritis, left knee: Secondary | ICD-10-CM | POA: Diagnosis not present

## 2016-09-12 ENCOUNTER — Other Ambulatory Visit: Payer: Self-pay | Admitting: Internal Medicine

## 2016-09-22 DIAGNOSIS — E291 Testicular hypofunction: Secondary | ICD-10-CM | POA: Diagnosis not present

## 2016-09-22 DIAGNOSIS — Z125 Encounter for screening for malignant neoplasm of prostate: Secondary | ICD-10-CM | POA: Diagnosis not present

## 2016-09-22 DIAGNOSIS — R350 Frequency of micturition: Secondary | ICD-10-CM | POA: Diagnosis not present

## 2016-09-25 DIAGNOSIS — Z23 Encounter for immunization: Secondary | ICD-10-CM | POA: Diagnosis not present

## 2016-10-08 ENCOUNTER — Ambulatory Visit: Payer: Self-pay | Admitting: Orthopedic Surgery

## 2016-10-17 ENCOUNTER — Other Ambulatory Visit: Payer: Self-pay | Admitting: Internal Medicine

## 2016-11-11 ENCOUNTER — Ambulatory Visit (INDEPENDENT_AMBULATORY_CARE_PROVIDER_SITE_OTHER): Payer: BLUE CROSS/BLUE SHIELD | Admitting: Internal Medicine

## 2016-11-11 ENCOUNTER — Encounter: Payer: Self-pay | Admitting: Internal Medicine

## 2016-11-11 DIAGNOSIS — M2550 Pain in unspecified joint: Secondary | ICD-10-CM | POA: Diagnosis not present

## 2016-11-11 DIAGNOSIS — F329 Major depressive disorder, single episode, unspecified: Secondary | ICD-10-CM | POA: Diagnosis not present

## 2016-11-11 DIAGNOSIS — I10 Essential (primary) hypertension: Secondary | ICD-10-CM | POA: Diagnosis not present

## 2016-11-11 DIAGNOSIS — M1711 Unilateral primary osteoarthritis, right knee: Secondary | ICD-10-CM

## 2016-11-11 DIAGNOSIS — F32A Depression, unspecified: Secondary | ICD-10-CM

## 2016-11-11 NOTE — Progress Notes (Signed)
Patient ID: Jason Ingram, male   DOB: 1956-11-09, 60 y.o.   MRN: [id number]    Location:  P[facility] Place of Service: OFFICE  Chief Complaint  Patient presents with  . Medical Management of Chronic Issues    3 months routine visit    HPI:  60 yo male seen today for f/u. He is scheduled for right TKR 12/28/16 with [facility] Dr Lequita Halt. He sold his house in [place] before Thanksgiving. No other concerns  He has right heel pain and was tx with PT.  Obesity - currently off phentermine. He is off his strict diet. Weight up 3 lbs since last OV.   Chronic knee pain - he was told he has bone-on-bone. He would like to lose weight before considering sx. He takes ibuprofen-famotidine prn.  Multiple joint pain - right shoulder, right ankle feels sore, knee pain  Depression - takes 10 mg paxil due to c/a not being able to lose weight. He has taken med since 1990s. He does have anger outbursts. Followed by psych.   Testosterone deficiency - uses androgel. He c/o ED sx's.  Followed by urology in [place]. 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 He uses CPAP qhs. He will have sleep study in Feb 2018    Past Medical History:  Diagnosis Date  . Depression   . High cholesterol   . History of broken nose   . History of pneumonia 1998  . Hx of colonoscopy 2013  . Hypertension   . Injury of pelvis 1997  . Sleep apnea     Past Surgical History:  Procedure Laterality Date  . COLONOSCOPY  05/2012   Dr. Carlisle Cater  . FRACTURE SURGERY    . HAND SURGERY  2000   [facility]  . KNEE SURGERY  12/07/1976   [facility]  . KNEE SURGERY  2007  . SKIN GRAFT  1962   [facility]     Patient Care Team: Kirt Boys, DO as PCP - General (Internal Medicine) Coralyn Helling, MD as Consulting Physician (Pulmonary Disease)  Social History   Social History  .  Marital status: Married    Spouse name: N/A  . Number of children: N/A  . Years of education: N/A   Occupational History  . Engineer    Social History Main Topics  . Smoking status: Never Smoker  . Smokeless tobacco: Never Used  . Alcohol use 0.6 oz/week    1 Standard drinks or equivalent per week     Comment: 4 drinks weekly  . Drug use: No  . Sexual activity: Not on file   Other Topics Concern  . Not on file   Social History Narrative   Diet: N/A   Caffeine: Yes    Married: Yes, 2016   House: House, 2 stories    Pets: 2 Dogs   Current/Past profession: Building services engineer    Exercise: Yes, daily   Living Will: No   DNR: No   POA/HPOA: No         reports that he has never smoked. He has never used smokeless tobacco. He reports that he drinks about 0.6 oz of alcohol per week . He reports that he does not use drugs.  Family History  Problem Relation Age of Onset  . Lymphoma Mother   . Bipolar disorder Son   . Heart disease  Other     multiple   Family Status  Relation Status  . Mother Deceased  . Father Deceased  . Sister Alive  . Daughter Alive  . Son Alive  . Son   . Other      No Known Allergies  Medications: Patient's Medications  New Prescriptions   No medications on file  Previous Medications   CLONIDINE (CATAPRES) 0.3 MG TABLET    TAKE 1 TABLET BY MOUTH DAILY   FINASTERIDE (PROSCAR) 5 MG TABLET    Take 5 mg by mouth daily.   IBUPROFEN-FAMOTIDINE (DUEXIS) 800-26.6 MG TABS    Take 1 tablet by mouth every 8 (eight) hours as needed. Take 1 tablet by mouth every 8 hours as needed   MULTIPLE VITAMINS-MINERALS (CENTRUM SILVER ADULT 50+) TABS    Take by mouth daily.   PAROXETINE (PAXIL) 20 MG TABLET    Take 1 tablet (20 mg total) by mouth daily.   SIMVASTATIN (ZOCOR) 40 MG TABLET    TAKE 1 TABLET(40 MG) BY MOUTH AT BEDTIME FOR CHOLESTEROL   TRIAMTERENE-HYDROCHLOROTHIAZIDE (DYAZIDE) 37.5-25 MG CAPSULE    TAKE 1 CAPSULE BY MOUTH DAILY  Modified  Medications   No medications on file  Discontinued Medications   No medications on file    Review of Systems  Vitals:   11/11/16 1137  BP: 118/80  Pulse: (!) 56  Temp: 97.7 F (36.5 C)  TempSrc: Oral  SpO2: 96%  Weight: (!) 331 lb 6.4 oz (150.3 kg)  Height: 6\' 6"  (1.981 m)   Body mass index is 38.3 kg/m.  Physical Exam  Constitutional: He is oriented to person, place, and time. He appears well-developed and well-nourished.  HENT:  Mouth/Throat: Oropharynx is clear and moist.  Eyes: Pupils are equal, round, and reactive to light. No scleral icterus.  Neck: Neck supple. Carotid bruit is not present. No thyromegaly present.  Cardiovascular: Normal rate, regular rhythm, normal heart sounds and intact distal pulses.  Exam reveals no gallop and no friction rub.   No murmur heard. no distal LE swelling. No calf TTP  Pulmonary/Chest: Effort normal and breath sounds normal. He has no wheezes. He has no rales. He exhibits no tenderness.  Abdominal: Soft. Bowel sounds are normal. He exhibits no distension, no abdominal bruit, no pulsatile midline mass and no mass. There is no hepatomegaly. There is no tenderness. There is no rebound and no guarding.  Morbidly obese  Musculoskeletal: He exhibits edema (R>L knee swelling) and tenderness (left medial knee).  Lymphadenopathy:    He has no cervical adenopathy.  Neurological: He is alert and oriented to person, place, and time. He has normal reflexes.  Skin: Skin is warm and dry. No rash noted.  Multiple cherry nevi  Psychiatric: He has a normal mood and affect. His behavior is normal. Judgment and thought content normal.     Labs reviewed: No visits with results within 3 Month(s) from this visit.  Latest known visit with results is:  Appointment on 06/24/2016  Component Date Value Ref Range Status  . Sodium 06/24/2016 140  135 - 145 mEq/L Final  . Potassium 06/24/2016 4.4  3.5 - 5.1 mEq/L Final  . Chloride 06/24/2016 102  96 - 112  mEq/L Final  . CO2 06/24/2016 33* 19 - 32 mEq/L Final  . Glucose, Bld 06/24/2016 92  70 - 99 mg/dL Final  . BUN 16/10/960407/19/2017 24* 6 - 23 mg/dL Final  . Creatinine, Ser 06/24/2016 1.15  0.40 - 1.50 mg/dL Final  .  Total Bilirubin 06/24/2016 0.5  0.2 - 1.2 mg/dL Final  . Alkaline Phosphatase 06/24/2016 55  39 - 117 U/L Final  . AST 06/24/2016 12  0 - 37 U/L Final  . ALT 06/24/2016 13  0 - 53 U/L Final  . Total Protein 06/24/2016 7.2  6.0 - 8.3 g/dL Final  . Albumin 21/30/865707/19/2017 4.1  3.5 - 5.2 g/dL Final  . Calcium 84/69/629507/19/2017 9.8  8.4 - 10.5 mg/dL Final  . GFR 28/41/324407/19/2017 68.67  >60.00 mL/min Final  . Pro B Natriuretic peptide (BNP) 06/24/2016 37.0  0.0 - 100.0 pg/mL Final  . WBC 06/24/2016 6.7  4.0 - 10.5 K/uL Final  . RBC 06/24/2016 5.34  4.22 - 5.81 Mil/uL Final  . Platelets 06/24/2016 272.0  150.0 - 400.0 K/uL Final  . Hemoglobin 06/24/2016 15.1  13.0 - 17.0 g/dL Final  . HCT 01/02/725307/19/2017 46.2  39.0 - 52.0 % Final  . MCV 06/24/2016 86.5  78.0 - 100.0 fl Final  . MCHC 06/24/2016 32.7  30.0 - 36.0 g/dL Final  . RDW 66/44/034707/19/2017 14.5  11.5 - 15.5 % Final    No results found.   Assessment/Plan   ICD-9-CM ICD-10-CM   1. Morbid obesity due to excess calories (HCC) 278.01 E66.01   2. Pain, joint, multiple sites 719.49 M25.50   3. Essential hypertension 401.9 I10   4. Depression, unspecified depression type 311 F32.9   5. Primary osteoarthritis of right knee 715.16 M17.11    Continue current medications as ordered  Follow up with ortho as scheduled. He is medically cleared for surgery  Follow up in 4 mos for routine visit   Jamis Kryder S. Ancil Linseyarter, D. O., F. A. C. O. I.  [facility] [address] [place], [place] [address] [phone] Cell (Monday-Friday 8 AM - 5 PM) [phone] After 5 PM and follow prompts

## 2016-11-11 NOTE — Patient Instructions (Signed)
Continue current medications as ordered  Follow up with ortho as scheduled  Follow up in 4 mos for routine visit

## 2016-12-09 DIAGNOSIS — F339 Major depressive disorder, recurrent, unspecified: Secondary | ICD-10-CM | POA: Diagnosis not present

## 2016-12-22 ENCOUNTER — Encounter (HOSPITAL_COMMUNITY): Payer: BLUE CROSS/BLUE SHIELD

## 2016-12-26 ENCOUNTER — Other Ambulatory Visit: Payer: Self-pay | Admitting: Internal Medicine

## 2016-12-28 ENCOUNTER — Inpatient Hospital Stay: Admit: 2016-12-28 | Payer: BLUE CROSS/BLUE SHIELD | Admitting: Orthopedic Surgery

## 2016-12-28 SURGERY — ARTHROPLASTY, KNEE, TOTAL
Anesthesia: Choice | Site: Knee | Laterality: Left

## 2016-12-28 NOTE — Telephone Encounter (Signed)
Spoke with patient and confirmed that he is no longer taking this medication

## 2017-01-26 ENCOUNTER — Ambulatory Visit (INDEPENDENT_AMBULATORY_CARE_PROVIDER_SITE_OTHER): Payer: BLUE CROSS/BLUE SHIELD | Admitting: Pulmonary Disease

## 2017-01-26 ENCOUNTER — Encounter: Payer: Self-pay | Admitting: Pulmonary Disease

## 2017-01-26 VITALS — BP 128/80 | HR 69 | Ht 76.0 in | Wt 321.0 lb

## 2017-01-26 DIAGNOSIS — Z9989 Dependence on other enabling machines and devices: Secondary | ICD-10-CM

## 2017-01-26 DIAGNOSIS — G4733 Obstructive sleep apnea (adult) (pediatric): Secondary | ICD-10-CM

## 2017-01-26 DIAGNOSIS — R0600 Dyspnea, unspecified: Secondary | ICD-10-CM

## 2017-01-26 DIAGNOSIS — R0609 Other forms of dyspnea: Secondary | ICD-10-CM | POA: Diagnosis not present

## 2017-01-26 DIAGNOSIS — E669 Obesity, unspecified: Secondary | ICD-10-CM

## 2017-01-26 LAB — PULMONARY FUNCTION TEST
DL/VA % pred: 81 %
DL/VA: 4.02 ml/min/mmHg/L
DLCO COR % PRED: 83 %
DLCO COR: 33.66 ml/min/mmHg
DLCO UNC % PRED: 85 %
DLCO UNC: 34.74 ml/min/mmHg
FEF 25-75 POST: 3.26 L/s
FEF 25-75 PRE: 3.69 L/s
FEF2575-%Change-Post: -11 %
FEF2575-%PRED-PRE: 106 %
FEF2575-%Pred-Post: 94 %
FEV1-%Change-Post: -3 %
FEV1-%PRED-POST: 98 %
FEV1-%PRED-PRE: 101 %
FEV1-Post: 4.28 L
FEV1-Pre: 4.43 L
FEV1FVC-%CHANGE-POST: 0 %
FEV1FVC-%Pred-Pre: 102 %
FEV6-%CHANGE-POST: -2 %
FEV6-%PRED-PRE: 103 %
FEV6-%Pred-Post: 101 %
FEV6-POST: 5.6 L
FEV6-Pre: 5.73 L
FEV6FVC-%Change-Post: 0 %
FEV6FVC-%PRED-POST: 104 %
FEV6FVC-%Pred-Pre: 103 %
FVC-%Change-Post: -2 %
FVC-%PRED-PRE: 100 %
FVC-%Pred-Post: 97 %
FVC-PRE: 5.78 L
FVC-Post: 5.62 L
POST FEV6/FVC RATIO: 100 %
PRE FEV1/FVC RATIO: 77 %
Post FEV1/FVC ratio: 76 %
Pre FEV6/FVC Ratio: 99 %
RV % pred: 94 %
RV: 2.47 L
TLC % PRED: 99 %
TLC: 8.13 L

## 2017-01-26 NOTE — Progress Notes (Signed)
Current Outpatient Prescriptions on File Prior to Visit  Medication Sig  . cloNIDine (CATAPRES) 0.3 MG tablet TAKE 1 TABLET BY MOUTH DAILY  . finasteride (PROSCAR) 5 MG tablet Take 5 mg by mouth daily.  . Ibuprofen-Famotidine (DUEXIS) 800-26.6 MG TABS Take 1 tablet by mouth every 8 (eight) hours as needed. Take 1 tablet by mouth every 8 hours as needed  . Multiple Vitamins-Minerals (CENTRUM SILVER ADULT 50+) TABS Take by mouth daily.  Marland Kitchen. PARoxetine (PAXIL) 20 MG tablet Take 1 tablet (20 mg total) by mouth daily.  . simvastatin (ZOCOR) 40 MG tablet TAKE 1 TABLET(40 MG) BY MOUTH AT BEDTIME FOR CHOLESTEROL  . triamterene-hydrochlorothiazide (DYAZIDE) 37.5-25 MG capsule TAKE 1 CAPSULE BY MOUTH DAILY   No current facility-administered medications on file prior to visit.      Chief Complaint  Patient presents with  . Follow-up    PFT results    Pulmonary tests PFT 01/26/17 >> FEV1 4.43 (101%), FEV1% 77, TLC 8.13 (99%), DLCO 85%, no BD  Sleep tests PSG 2005 >> AHI 50 CPAP 05/02/16 to 05/31/16 >> used on 30 of 30 nights with average 10 hrs 15 min.  Average AHI 1.7 with CPAP 10 cm H2O. ONO with CPAP 08/31/16 >> test time 7 hrs 39 min.  Baseline SpO2 92.8%, low SpO2 89%.    Past medical history Depression, HLD, HTN, PNA  Past surgical history, Family history, Social history, Allergies all reviewed  Vital Signs BP 128/80 (BP Location: Right Arm, Cuff Size: Large)   Pulse 69   Ht 6\' 4"  (1.93 m)   Wt (!) 321 lb (145.6 kg)   SpO2 96%   BMI 39.07 kg/m   History of Present Illness Jason Ingram is a 61 y.o. male with obstructive sleep apnea and dyspnea.  Since his last visit he had CXR and ONO.  Both okay.  He had PFT today >> normal.   He has started exercising more and has lost almost 15 lbs since last visit.  His breathing has improved.  He denies cough, wheeze, chest pain, fever, swelling, or skin rash.  He gets cramps in his legs at times when he exercises.  He uses his CPAP  nightly.  This helps.  He is not consistent about cleaning it.  He is not sure it is working the way it should anymore.  It is more than 62 yrs old.      Physical Exam  General - No distress Eyes - wears glasses ENT - No sinus tenderness, no oral exudate, no LAN Cardiac - s1s2 regular, no murmur Chest - No wheeze/rales/dullness Back - No focal tenderness Abd - Soft, non-tender Ext - No edema Neuro - Normal strength Skin - No rashes Psych - normal mood, and behavior   Assessment/Plan  Obstructive sleep apnea. - he is compliant with therapy and reports benefit from CPAP - will arrange for new CPAP machine since his is more than 62 yrs old - will arrange for [facility] Mini at 10 cm H2O >> he travels frequently with work - explained he might need repeat sleep study for insurance coverage of new machine >> could be home sleep study  Dyspnea. - pulmonary evaluation unrevealing - improved with weight loss and increased exercise regimen >> most likely obesity and deconditioning  Obesity. - encouraged him to continue his exercise and diet regimen   Patient Instructions  Will try to arrange for new CPAP machine  Follow up in 4 months    Aviel Davalos,  MD [facility] Pager:  [phone] 01/26/2017, 2:55 PM

## 2017-01-26 NOTE — Patient Instructions (Signed)
Will try to arrange for new CPAP machine  Follow up in 4 months 

## 2017-01-27 ENCOUNTER — Other Ambulatory Visit: Payer: Self-pay | Admitting: Internal Medicine

## 2017-01-27 DIAGNOSIS — M2550 Pain in unspecified joint: Secondary | ICD-10-CM

## 2017-01-27 DIAGNOSIS — M25569 Pain in unspecified knee: Secondary | ICD-10-CM

## 2017-02-09 ENCOUNTER — Telehealth: Payer: Self-pay | Admitting: Pulmonary Disease

## 2017-02-09 DIAGNOSIS — G4733 Obstructive sleep apnea (adult) (pediatric): Secondary | ICD-10-CM

## 2017-02-09 NOTE — Telephone Encounter (Signed)
Spoke with pt. He would like to have an order [facility] for a new CPAP machine. This has been taken care of. Nothing further was needed.

## 2017-02-10 ENCOUNTER — Ambulatory Visit: Payer: BLUE CROSS/BLUE SHIELD | Admitting: Internal Medicine

## 2017-02-23 DIAGNOSIS — H35033 Hypertensive retinopathy, bilateral: Secondary | ICD-10-CM | POA: Diagnosis not present

## 2017-03-02 DIAGNOSIS — J069 Acute upper respiratory infection, unspecified: Secondary | ICD-10-CM | POA: Diagnosis not present

## 2017-03-02 DIAGNOSIS — I10 Essential (primary) hypertension: Secondary | ICD-10-CM | POA: Diagnosis not present

## 2017-03-03 DIAGNOSIS — G4733 Obstructive sleep apnea (adult) (pediatric): Secondary | ICD-10-CM | POA: Diagnosis not present

## 2017-03-03 DIAGNOSIS — F339 Major depressive disorder, recurrent, unspecified: Secondary | ICD-10-CM | POA: Diagnosis not present

## 2017-04-03 DIAGNOSIS — G4733 Obstructive sleep apnea (adult) (pediatric): Secondary | ICD-10-CM | POA: Diagnosis not present

## 2017-04-20 DIAGNOSIS — F4325 Adjustment disorder with mixed disturbance of emotions and conduct: Secondary | ICD-10-CM | POA: Diagnosis not present

## 2017-04-26 ENCOUNTER — Other Ambulatory Visit: Payer: Self-pay | Admitting: Internal Medicine

## 2017-05-03 DIAGNOSIS — G4733 Obstructive sleep apnea (adult) (pediatric): Secondary | ICD-10-CM | POA: Diagnosis not present

## 2017-05-04 DIAGNOSIS — F4325 Adjustment disorder with mixed disturbance of emotions and conduct: Secondary | ICD-10-CM | POA: Diagnosis not present

## 2017-05-11 DIAGNOSIS — F4325 Adjustment disorder with mixed disturbance of emotions and conduct: Secondary | ICD-10-CM | POA: Diagnosis not present

## 2017-05-18 DIAGNOSIS — F4325 Adjustment disorder with mixed disturbance of emotions and conduct: Secondary | ICD-10-CM | POA: Diagnosis not present

## 2017-05-20 DIAGNOSIS — M170 Bilateral primary osteoarthritis of knee: Secondary | ICD-10-CM | POA: Diagnosis not present

## 2017-05-25 DIAGNOSIS — J0100 Acute maxillary sinusitis, unspecified: Secondary | ICD-10-CM | POA: Diagnosis not present

## 2017-05-25 DIAGNOSIS — R05 Cough: Secondary | ICD-10-CM | POA: Diagnosis not present

## 2017-06-01 ENCOUNTER — Ambulatory Visit: Payer: BLUE CROSS/BLUE SHIELD | Admitting: Pulmonary Disease

## 2017-06-02 DIAGNOSIS — F4325 Adjustment disorder with mixed disturbance of emotions and conduct: Secondary | ICD-10-CM | POA: Diagnosis not present

## 2017-06-03 DIAGNOSIS — G4733 Obstructive sleep apnea (adult) (pediatric): Secondary | ICD-10-CM | POA: Diagnosis not present

## 2017-06-08 DIAGNOSIS — F4325 Adjustment disorder with mixed disturbance of emotions and conduct: Secondary | ICD-10-CM | POA: Diagnosis not present

## 2017-06-12 DIAGNOSIS — J069 Acute upper respiratory infection, unspecified: Secondary | ICD-10-CM | POA: Diagnosis not present

## 2017-06-12 DIAGNOSIS — H6693 Otitis media, unspecified, bilateral: Secondary | ICD-10-CM | POA: Diagnosis not present

## 2017-07-03 DIAGNOSIS — G4733 Obstructive sleep apnea (adult) (pediatric): Secondary | ICD-10-CM | POA: Diagnosis not present

## 2017-07-03 DIAGNOSIS — D751 Secondary polycythemia: Secondary | ICD-10-CM | POA: Diagnosis not present

## 2017-07-06 DIAGNOSIS — F4325 Adjustment disorder with mixed disturbance of emotions and conduct: Secondary | ICD-10-CM | POA: Diagnosis not present

## 2017-07-10 DIAGNOSIS — F4325 Adjustment disorder with mixed disturbance of emotions and conduct: Secondary | ICD-10-CM | POA: Diagnosis not present

## 2017-07-11 ENCOUNTER — Other Ambulatory Visit: Payer: Self-pay | Admitting: Internal Medicine

## 2017-07-13 DIAGNOSIS — F4325 Adjustment disorder with mixed disturbance of emotions and conduct: Secondary | ICD-10-CM | POA: Diagnosis not present

## 2017-07-20 DIAGNOSIS — F4325 Adjustment disorder with mixed disturbance of emotions and conduct: Secondary | ICD-10-CM | POA: Diagnosis not present

## 2017-07-27 DIAGNOSIS — F4325 Adjustment disorder with mixed disturbance of emotions and conduct: Secondary | ICD-10-CM | POA: Diagnosis not present

## 2017-08-03 DIAGNOSIS — F332 Major depressive disorder, recurrent severe without psychotic features: Secondary | ICD-10-CM | POA: Diagnosis not present

## 2017-08-03 DIAGNOSIS — G4733 Obstructive sleep apnea (adult) (pediatric): Secondary | ICD-10-CM | POA: Diagnosis not present

## 2017-08-04 DIAGNOSIS — G4700 Insomnia, unspecified: Secondary | ICD-10-CM | POA: Diagnosis not present

## 2017-08-07 DIAGNOSIS — F4325 Adjustment disorder with mixed disturbance of emotions and conduct: Secondary | ICD-10-CM | POA: Diagnosis not present

## 2017-08-10 DIAGNOSIS — F4325 Adjustment disorder with mixed disturbance of emotions and conduct: Secondary | ICD-10-CM | POA: Diagnosis not present

## 2017-08-16 ENCOUNTER — Telehealth: Payer: Self-pay | Admitting: *Deleted

## 2017-08-16 DIAGNOSIS — M25562 Pain in left knee: Principal | ICD-10-CM

## 2017-08-16 DIAGNOSIS — F331 Major depressive disorder, recurrent, moderate: Secondary | ICD-10-CM | POA: Diagnosis not present

## 2017-08-16 DIAGNOSIS — M25561 Pain in right knee: Secondary | ICD-10-CM

## 2017-08-16 DIAGNOSIS — M2550 Pain in unspecified joint: Secondary | ICD-10-CM

## 2017-08-16 MED ORDER — SIMVASTATIN 40 MG PO TABS: ORAL_TABLET | ORAL | 0 refills | Status: DC

## 2017-08-16 MED ORDER — CLONIDINE HCL 0.3 MG PO TABS
0.3000 mg | ORAL_TABLET | Freq: Every day | ORAL | 1 refills | Status: DC
Start: 2017-08-16 — End: 2017-10-14

## 2017-08-16 MED ORDER — IBUPROFEN-FAMOTIDINE 800-26.6 MG PO TABS: 1.0000 | ORAL_TABLET | Freq: Three times a day (TID) | ORAL | 1 refills | Status: DC | PRN

## 2017-08-16 MED ORDER — FINASTERIDE 5 MG PO TABS: 5.0000 mg | ORAL_TABLET | Freq: Every day | ORAL | 1 refills | Status: AC

## 2017-08-16 MED ORDER — TRIAMTERENE-HCTZ 37.5-25 MG PO CAPS: 1.0000 | ORAL_CAPSULE | Freq: Every day | ORAL | 1 refills | Status: DC

## 2017-08-16 MED ORDER — PAROXETINE HCL 20 MG PO TABS: 20.0000 mg | ORAL_TABLET | Freq: Every day | ORAL | 1 refills | Status: DC

## 2017-08-16 NOTE — Telephone Encounter (Signed)
Rx's sent to pharmacy.  

## 2017-08-16 NOTE — Telephone Encounter (Signed)
Patient called and stated that [place] to [place]. Stated that he will make an appointment when he gets back into town but would like refills on his medications until he returns. Needs all Rx's. Please Advise.    Pharmacy: [facility] [address] [place] [id number]. [phone]

## 2017-08-16 NOTE — Telephone Encounter (Signed)
Ok to ref[place] meds [place]

## 2017-08-24 DIAGNOSIS — F4325 Adjustment disorder with mixed disturbance of emotions and conduct: Secondary | ICD-10-CM | POA: Diagnosis not present

## 2017-09-03 DIAGNOSIS — G4733 Obstructive sleep apnea (adult) (pediatric): Secondary | ICD-10-CM | POA: Diagnosis not present

## 2017-09-10 DIAGNOSIS — F339 Major depressive disorder, recurrent, unspecified: Secondary | ICD-10-CM | POA: Diagnosis not present

## 2017-09-12 ENCOUNTER — Other Ambulatory Visit: Payer: Self-pay | Admitting: Internal Medicine

## 2017-09-24 DIAGNOSIS — F339 Major depressive disorder, recurrent, unspecified: Secondary | ICD-10-CM | POA: Diagnosis not present

## 2017-09-29 ENCOUNTER — Encounter: Payer: Self-pay | Admitting: Internal Medicine

## 2017-09-29 ENCOUNTER — Ambulatory Visit (INDEPENDENT_AMBULATORY_CARE_PROVIDER_SITE_OTHER): Payer: BLUE CROSS/BLUE SHIELD | Admitting: Internal Medicine

## 2017-09-29 VITALS — BP 132/80 | HR 66 | Temp 98.0°F | Resp 12 | Ht 76.0 in | Wt 301.0 lb

## 2017-09-29 DIAGNOSIS — Z7189 Other specified counseling: Secondary | ICD-10-CM

## 2017-09-29 DIAGNOSIS — Z79899 Other long term (current) drug therapy: Secondary | ICD-10-CM | POA: Diagnosis not present

## 2017-09-29 DIAGNOSIS — G8929 Other chronic pain: Secondary | ICD-10-CM

## 2017-09-29 DIAGNOSIS — M25561 Pain in right knee: Secondary | ICD-10-CM | POA: Diagnosis not present

## 2017-09-29 DIAGNOSIS — Z114 Encounter for screening for human immunodeficiency virus [HIV]: Secondary | ICD-10-CM

## 2017-09-29 DIAGNOSIS — I10 Essential (primary) hypertension: Secondary | ICD-10-CM | POA: Diagnosis not present

## 2017-09-29 DIAGNOSIS — Z23 Encounter for immunization: Secondary | ICD-10-CM | POA: Diagnosis not present

## 2017-09-29 DIAGNOSIS — Z0000 Encounter for general adult medical examination without abnormal findings: Secondary | ICD-10-CM | POA: Diagnosis not present

## 2017-09-29 DIAGNOSIS — Z1159 Encounter for screening for other viral diseases: Secondary | ICD-10-CM

## 2017-09-29 DIAGNOSIS — G4733 Obstructive sleep apnea (adult) (pediatric): Secondary | ICD-10-CM | POA: Diagnosis not present

## 2017-09-29 DIAGNOSIS — E349 Endocrine disorder, unspecified: Secondary | ICD-10-CM

## 2017-09-29 DIAGNOSIS — E782 Mixed hyperlipidemia: Secondary | ICD-10-CM | POA: Insufficient documentation

## 2017-09-29 DIAGNOSIS — M25562 Pain in left knee: Secondary | ICD-10-CM

## 2017-09-29 DIAGNOSIS — Z9189 Other specified personal risk factors, not elsewhere classified: Secondary | ICD-10-CM

## 2017-09-29 NOTE — Patient Instructions (Addendum)
Will call with lab results  Follow up with Ortho and urology as scheduled  Flu shot given today  Follow up in 1 yr for CPE/ECG.   Keeping you healthy  Get these tests  Blood pressure- Have your blood pressure checked once a year by your healthcare provider.  Normal blood pressure is 120/80  Weight- Have your body mass index (BMI) calculated to screen for obesity.  BMI is a measure of body fat based on height and weight. You can also calculate your own BMI at [email or web address]/.  Cholesterol- Have your cholesterol checked every year.  Diabetes- Have your blood sugar checked regularly if you have high blood pressure, high cholesterol, have a family history of diabetes or if you are overweight.  Screening for Colon Cancer- Colonoscopy starting at age 62.  Screening may begin sooner depending on your family history and other health conditions. Follow up colonoscopy as directed by your Gastroenterologist.  Screening for Prostate Cancer- Both blood work (PSA) and a rectal exam help screen for Prostate Cancer.  Screening begins at age 62 with African-American men and at age 62 with Caucasian men.  Screening may begin sooner depending on your family history.  Take these medicines  Aspirin- One aspirin daily can help prevent Heart disease and Stroke.  Flu shot- Every fall.  Tetanus- Every 10 years.  Zostavax- Once after the age of 62 to prevent Shingles.  Pneumonia shot- Once after the age of 62; if you are younger than 7065, ask your healthcare provider if you need a Pneumonia shot.  Take these steps  Don't smoke- If you do smoke, talk to your doctor about quitting.  For tips on how to quit, go to www.smokefree.gov or call 1-800-QUIT-NOW.  Be physically active- Exercise 5 days a week for at least 30 minutes.  If you are not already physically active start slow and gradually work up to 30 minutes of moderate physical activity.  Examples of moderate activity include walking briskly,  mowing the yard, dancing, swimming, bicycling, etc.  Eat a healthy diet- Eat a variety of healthy food such as fruits, vegetables, low fat milk, low fat cheese, yogurt, lean meant, poultry, fish, beans, tofu, etc. For more information go to www.thenutritionsource.org  Drink alcohol in moderation- Limit alcohol intake to less than two drinks a day. Never drink and drive.  Dentist- Brush and floss twice daily; visit your dentist twice a year.  Depression- Your emotional health is as important as your physical health. If you're feeling down, or losing interest in things you would normally enjoy please talk to your healthcare provider.  Eye exam- Visit your eye doctor every year.  Safe sex- If you may be exposed to a sexually transmitted infection, use a condom.  Seat belts- Seat belts can save your life; always wear one.  Smoke/Carbon Monoxide detectors- These detectors need to be installed on the appropriate level of your home.  Replace batteries at least once a year.  Skin cancer- When out in the sun, cover up and use sunscreen 15 SPF or higher.  Violence- If anyone is threatening you, please tell your healthcare provider.  Living Will/ Health care power of attorney- Speak with your healthcare provider and family.

## 2017-09-29 NOTE — Progress Notes (Signed)
Location:  [facility]  Place of Service:  OFFICE  Provider: Grisell Bissette [place], DO  Patient Care Team: Gildardo Cranker, DO as PCP - General (Internal Medicine) Chesley Mires, MD as Consulting Physician (Pulmonary Disease)  Extended Emergency Contact Information Primary Emergency Contact: Cadenhead,Stephanie Address: [place], [place] [address] [address] Phone: [phone] Relation: Spouse Secondary Emergency Contact: Leifheit,Mathew  [place] Phone: [phone] Relation: Son  Code Status: FULL CODE Goals of Care: Advanced Directive information Advanced Directives 11/11/2016  Does Patient Have a Medical Advance Directive? No  Would patient like information on creating a medical advance directive? No - Patient declined     Chief Complaint  Patient presents with  . Annual Exam    Yearly check-up, EKG completed in July 2018 at Urgent Care, patient told normal, will recheck today due to pending surgery.  . Immunizations    Flu vaccine   . Health Maintenance    Patient would like Hep C and HIV screenings, aware may not be covered by insurance   . Medication Refill    No refills needed   . FYI    Pending knee replacement 11/08/17, will see Urologist in 1-2 weeks from today (will get prostate checked)     HPI: Patient is a 61 y.o. male seen in today for an annual wellness exam.  He returned from 6 mos business trip to [place], [place]. No falls or hospitailizations  Obesity - currently off phentermine. He is on [employer]. Weight down 20 lbs since last OV. GOAL WEIGHT 250 LBS  Chronic knee pain - he was told he has bone-on-bone. Left TKR scheduled November 08, 2017. He would like to lose weight before considering sx. He takes ibuprofen-famotidine prn.  Multiple joint pain - right shoulder, right ankle feels sore, knee pain  Depression - takes 10 mg paxil due to c/a not being able to lose weight. He has taken med since 1990s. He does  have anger outbursts. Followed by psych.   Testosterone deficiency - uses androgel. He c/o ED sx's.  Followed by urology in [place]. 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 He uses CPAP qhs. Last sleep study early Spring 2018  Hyperlipidemia - he missed several dose 2/2 running out of med in August. Takes simvastatin  Depression screen [facility] 2/9 09/29/2017 01/31/2016 10/09/2015  Decreased Interest 0 0 0  Down, Depressed, Hopeless 0 0 0  PHQ - 2 Score 0 0 0    Fall Risk  09/29/2017 11/11/2016 08/12/2016 05/08/2016 01/31/2016  Falls in the past year? _0    No flowsheet data found.   Health Maintenance  Topic Date Due  . Hepatitis C Screening  [birth year]  . HIV Screening  04/16/1970  . INFLUENZA VACCINE  07/07/2017  . COLONOSCOPY  05/09/2022  . TETANUS/TDAP  01/30/2026    Past Medical History:  Diagnosis Date  . Depression   . High cholesterol   . History of broken nose   . History of pneumonia 1998  . Hx of colonoscopy 2013  . Hypertension   . Injury of pelvis 1997  . Sleep apnea     Past Surgical History:  Procedure Laterality Date  . COLONOSCOPY  05/2012   Dr. Gerline Legacy  . FRACTURE SURGERY    .  HAND SURGERY  2000   Thumb Repair  . KNEE SURGERY  12/07/1976   [facility]  . KNEE SURGERY  2007  . SKIN GRAFT  [facility]     Family History  Problem Relation Age of Onset  . Lymphoma Mother   . Bipolar disorder Son   . Heart disease Other        multiple   Family Status  Relation Status  . Mother Deceased  . Father Deceased  . Sister Alive  . Daughter Alive  . Son Alive  . Son (Not Specified)  . Other (Not Specified)    Social History   Social History  . Marital status: Married    Spouse name: N/A  . Number of children: N/A  . Years of education: N/A   Occupational History  . Engineer    Social  History Main Topics  . Smoking status: Never Smoker  . Smokeless tobacco: Never Used  . Alcohol use 0.6 oz/week    1 Standard drinks or equivalent per week     Comment: 4 drinks weekly  . Drug use: No  . Sexual activity: Not on file   Other Topics Concern  . Not on file   Social History Narrative   Diet: N/A   Caffeine: Yes    Married: Yes, 2016   House: House, 2 stories    Pets: 2 Dogs   Current/Past profession: Contractor    Exercise: Yes, daily   Living Will: No   DNR: No   POA/HPOA: No        No Known Allergies  Allergies as of 09/29/2017   No Known Allergies     Medication List       Accurate as of 09/29/17 10:42 AM. Always use your most recent med list.          CENTRUM SILVER ADULT 50+ Tabs Take by mouth daily.   cloNIDine 0.3 MG tablet Commonly known as:  CATAPRES Take 1 tablet (0.3 mg total) by mouth daily.   finasteride 5 MG tablet Commonly known as:  PROSCAR Take 1 tablet (5 mg total) by mouth daily.   Ibuprofen-Famotidine 800-26.6 MG Tabs Commonly known as:  DUEXIS Take 1 tablet by mouth every 8 (eight) hours as needed.   PARoxetine 20 MG tablet Commonly known as:  PAXIL Take 1 tablet (20 mg total) by mouth daily.   simvastatin 40 MG tablet Commonly known as:  ZOCOR TAKE 1 TABLET(40 MG) BY MOUTH AT BEDTIME FOR CHOLESTEROL   triamterene-hydrochlorothiazide 37.5-25 MG capsule Commonly known as:  DYAZIDE Take 1 each (1 capsule total) by mouth daily.        Review of Systems:  Review of Systems  Physical Exam: Vitals:   09/29/17 1004  BP: 132/80  Pulse: 66  Resp: 12  Temp: 98 F (36.7 C)  TempSrc: Oral  SpO2: 95%  Weight: (!) 301 lb (136.5 kg)  Height: '6\' 4"'$  (1.93 m)   Body mass index is 36.64 kg/m. Physical Exam  Constitutional: He is oriented to person, place, and time. He appears well-developed and well-nourished. No distress.  HENT:  Head: Normocephalic and atraumatic.  Right Ear: External ear  normal.  Left Ear: External ear normal.  Mouth/Throat: Oropharynx is clear and moist. No oropharyngeal exudate.  MMM; no oral thrush  Eyes: Pupils are equal, round, and reactive to light. EOM are normal. No scleral icterus.  Neck: Normal range of motion. Neck supple. Carotid bruit is not  present. No tracheal deviation present. No thyromegaly present.  Cardiovascular: Normal rate, regular rhythm and intact distal pulses.  Exam reveals no gallop and no friction rub.   No murmur heard. No LE edema b/l. No calf TTP  Pulmonary/Chest: Effort normal and breath sounds normal. No respiratory distress. He has no wheezes. He has no rales. He exhibits no tenderness.  No rhonchi  Abdominal: Soft. Bowel sounds are normal. He exhibits no distension and no mass. There is no hepatosplenomegaly or hepatomegaly. There is no tenderness. There is no rebound and no guarding. No hernia.  Genitourinary:  Genitourinary Comments: Deferred to urology  Musculoskeletal: He exhibits edema (L>R medial knee swelling with reduced ROM) and tenderness (left medial and anterior knee). He exhibits no deformity.  Lymphadenopathy:    He has no cervical adenopathy.  Neurological: He is alert and oriented to person, place, and time. Gait (antalgic) abnormal.  Reduced DTR left knee  Skin: Skin is warm and dry. No rash noted.  Psychiatric: He has a normal mood and affect. His behavior is normal. Judgment normal.  Vitals reviewed.   Labs reviewed:  Basic Metabolic Panel: No results for input(s): NA, K, CL, CO2, GLUCOSE, BUN, CREATININE, CALCIUM, MG, PHOS, TSH in the last 8760 hours. Liver Function Tests: No results for input(s): AST, ALT, ALKPHOS, BILITOT, PROT, ALBUMIN in the last 8760 hours. No results for input(s): LIPASE, AMYLASE in the last 8760 hours. No results for input(s): AMMONIA in the last 8760 hours. CBC: No results for input(s): WBC, NEUTROABS, HGB, HCT, MCV, PLT in the last 8760 hours. Lipid Panel: No results  for input(s): CHOL, HDL, LDLCALC, TRIG, CHOLHDL, LDLDIRECT in the last 8760 hours. No results found for: HGBA1C  Procedures: No results found. ECG OBTAINED AND REVIEWED BY MYSELF: NSR @ 60 bpm, LAD, LAE, poor R wave progression. No acute ischemic changes. No other ECG available to compare.  Assessment/Plan   ICD-10-CM   1. Well adult exam Z00.00   2. Essential hypertension I10 EKG 12-Lead    CMP with eGFR    CBC with Differential/Platelets  3. Chronic pain of both knees M25.561    M25.562    G89.29   4. OSA (obstructive sleep apnea) G47.33   5. Morbid obesity due to excess calories (HCC) E66.01   6. Testosterone deficiency E34.9   7. Encounter for hepatitis C virus screening test for high risk patient Z11.59 Hep C Antibody   Z91.89   8. Encounter for screening for HIV Z11.4 HIV antibody (with reflex)  9. High risk medication use Z79.899 CMP with eGFR  10. Mixed hyperlipidemia E78.2 Lipid Panel    TSH  11.     Advanced Directives counseling/discussion  Z71.89  Medically cleared for surgery. Keep Perioperative BP <140/80  Will call with lab results  Follow up with Ortho and urology as scheduled  Flu shot given today  Discussed living will vs HCPOA - information provided  Follow up in 1 yr for CPE/ECG.  Healthy Living handout given  Cordella Register. Perlie Gold  [facility] [address] [place], [place] [address] [phone] Cell (Monday-Friday 8 AM - 5 PM) [phone] After 5 PM and follow prompts

## 2017-09-30 LAB — COMPLETE METABOLIC PANEL WITH GFR
AG RATIO: 1.5 (calc) (ref 1.0–2.5)
ALT: 15 U/L (ref 9–46)
AST: 15 U/L (ref 10–35)
Albumin: 4 g/dL (ref 3.6–5.1)
Alkaline phosphatase (APISO): 56 U/L (ref 40–115)
BILIRUBIN TOTAL: 0.4 mg/dL (ref 0.2–1.2)
BUN: 14 mg/dL (ref 7–25)
CHLORIDE: 103 mmol/L (ref 98–110)
CO2: 33 mmol/L — ABNORMAL HIGH (ref 20–32)
Calcium: 9.2 mg/dL (ref 8.6–10.3)
Creat: 1.07 mg/dL (ref 0.70–1.25)
GFR, EST AFRICAN AMERICAN: 86 mL/min/{1.73_m2} (ref >=60)
GFR, Est Non African American: 74 mL/min/{1.73_m2} (ref >=60)
Globulin: 2.7 g/dL (calc) (ref 1.9–3.7)
Glucose, Bld: 86 mg/dL (ref 65–139)
POTASSIUM: 3.6 mmol/L (ref 3.5–5.3)
SODIUM: 141 mmol/L (ref 135–146)
TOTAL PROTEIN: 6.7 g/dL (ref 6.1–8.1)

## 2017-09-30 LAB — CBC WITH DIFFERENTIAL/PLATELET
BASOS PCT: 0.5 %
Basophils Absolute: 32 cells/uL (ref 0–200)
Eosinophils Absolute: 63 cells/uL (ref 15–500)
Eosinophils Relative: 1 %
HEMATOCRIT: 41.5 % (ref 38.5–50.0)
HEMOGLOBIN: 14.2 g/dL (ref 13.2–17.1)
LYMPHS ABS: 1153 {cells}/uL (ref 850–3900)
MCH: 29 pg (ref 27.0–33.0)
MCHC: 34.2 g/dL (ref 32.0–36.0)
MCV: 84.7 fL (ref 80.0–100.0)
MPV: 10.1 fL (ref 7.5–12.5)
Monocytes Relative: 7.8 %
NEUTROS ABS: 4561 {cells}/uL (ref 1500–7800)
NEUTROS PCT: 72.4 %
Platelets: 229 10*3/uL (ref 140–400)
RBC: 4.9 10*6/uL (ref 4.20–5.80)
RDW: 13 % (ref 11.0–15.0)
Total Lymphocyte: 18.3 %
WBC: 6.3 10*3/uL (ref 3.8–10.8)
WBCMIX: 491 {cells}/uL (ref 200–950)

## 2017-09-30 LAB — HEPATITIS C ANTIBODY
HEP C AB: NONREACTIVE
SIGNAL TO CUT-OFF: 0.02 (ref <1.00)

## 2017-09-30 LAB — LIPID PANEL
Cholesterol: 143 mg/dL (ref <200)
HDL: 70 mg/dL (ref >40)
LDL Cholesterol (Calc): 55 mg/dL (calc)
Non-HDL Cholesterol (Calc): 73 mg/dL (calc) (ref <130)
TRIGLYCERIDES: 94 mg/dL (ref <150)
Total CHOL/HDL Ratio: 2 (calc) (ref <5.0)

## 2017-09-30 LAB — TSH: TSH: 1.06 mIU/L (ref 0.40–4.50)

## 2017-09-30 LAB — HIV ANTIBODY (ROUTINE TESTING W REFLEX): HIV 1&2 Ab, 4th Generation: NONREACTIVE

## 2017-10-03 DIAGNOSIS — G4733 Obstructive sleep apnea (adult) (pediatric): Secondary | ICD-10-CM | POA: Diagnosis not present

## 2017-10-05 DIAGNOSIS — Z125 Encounter for screening for malignant neoplasm of prostate: Secondary | ICD-10-CM | POA: Diagnosis not present

## 2017-10-07 ENCOUNTER — Ambulatory Visit: Payer: Self-pay | Admitting: Orthopedic Surgery

## 2017-10-08 DIAGNOSIS — F339 Major depressive disorder, recurrent, unspecified: Secondary | ICD-10-CM | POA: Diagnosis not present

## 2017-10-12 DIAGNOSIS — R350 Frequency of micturition: Secondary | ICD-10-CM | POA: Diagnosis not present

## 2017-10-12 DIAGNOSIS — E291 Testicular hypofunction: Secondary | ICD-10-CM | POA: Diagnosis not present

## 2017-10-14 ENCOUNTER — Other Ambulatory Visit: Payer: Self-pay | Admitting: Internal Medicine

## 2017-10-14 DIAGNOSIS — M2550 Pain in unspecified joint: Secondary | ICD-10-CM

## 2017-10-14 DIAGNOSIS — M25561 Pain in right knee: Secondary | ICD-10-CM

## 2017-10-14 DIAGNOSIS — M25562 Pain in left knee: Principal | ICD-10-CM

## 2017-10-18 DIAGNOSIS — S335XXA Sprain of ligaments of lumbar spine, initial encounter: Secondary | ICD-10-CM | POA: Diagnosis not present

## 2017-10-21 DIAGNOSIS — F339 Major depressive disorder, recurrent, unspecified: Secondary | ICD-10-CM | POA: Diagnosis not present

## 2017-10-23 ENCOUNTER — Ambulatory Visit: Payer: Self-pay | Admitting: Orthopedic Surgery

## 2017-10-23 NOTE — H&P (Signed)
Jason Ingram DOB: [birth year] Married / Language: [place] P[place] / Race: White Male Date of Admission:  November 08, 2017  Chief complaint: Left knee pain History of Present Illness The patient is a 61 year old male who comes in  for a preoperative History and Physical. The patient is scheduled for a left total knee arthroplasty to be performed by Dr. Gus RankinFrank V. Aluisio, [facility] on 11-08-2017. The patient is a 61 year old male who presented for follow up of their knee. The patient is being followed for their bilateral knee pain and osteoarthritis. Symptoms reported include: pain (increases w/ walking/activity), aching, stiffness, locking, catching, popping and pain with weightbearing. The patient feels that they are doing poorly and report their pain level to be mild to moderate. The following medication has been used for pain control: antiinflammatory medication - Ibuprofen. The patient has not gotten any relief of their symptoms with Cortisone injections. Jason Ingram has been seen to discuss possible knee replacement. He was found to have end stage arthritis of both knees. His left is more symptomatic than his right. He has been working at [employer] on his feet a lot and he feels like the knee is getting worse. It is hurting at all times. It is limiting what he can and cannot do. He has lost a considerable amount of weight. Despite the weight loss he still has significant discomfort in his knees. He is now at a point where he would like to proceed with surgery. They have been treated conservatively in the past for the above stated problem and despite conservative measures, they continue to have progressive pain and severe functional limitations and dysfunction. They have failed non-operative management including home exercise, medications, and injections. It is felt that they would benefit from undergoing total joint replacement. Risks and benefits of the procedure have been discussed with the  patient and they elect to proceed with surgery. There are no active contraindications to surgery such as ongoing infection or rapidly progressive neurological disease.   Problem List/Past Medical  Calcaneal spur, right (M77.31)  Plantar fasciitis, right (M72.2)  Primary osteoarthritis of left knee (M17.12)  Foot arch pain, right (M79.671)  Depression  High blood pressure  Hypercholesterolemia  Sleep Apnea  uses CPAP   Allergies  No Known Drug Allergies  Family History Cancer  Mother. Hypertension  Father.  Social History Children  2 Current drinker  07/10/2016: Currently drinks wine only occasionally per week Current work status  working full time Exercise  Exercises weekly; does running / walking Living situation  live with spouse Marital status  married No history of drug/alcohol rehab  Not under pain contract  Number of flights of stairs before winded  2-3 Tobacco / smoke exposure  07/10/2016: no Tobacco use  Never smoker. 07/10/2016 Post-Surgical Plans  Home With Family, Home, Straight to Outpatient Therapy. Advance Directives  None.  Medication History Duexis (800-26.6MG  Tablet, Oral as needed) Active. Finasteride (5MG  Tablet, Oral) Active. Tamsulosin Active. Triamterene-HCTZ (37.5-25MG  Capsule, Oral) Active. Simvastatin (40MG  Tablet, Oral) Active. PARoxetine HCl (20MG  Tablet, Oral) Active. CloNIDine HCl (0.3MG  Tablet, Oral) Active. Baclofen (as needed) Active. Centrum Silver (Oral) Active.  Past Surgical History  Straighten Nasal Septum  Tonsillectomy  Left Knee Surgery  1978. 2007   Review of Systems  General Not Present- Chills, Fatigue, Fever, Memory Loss, Night Sweats, Weight Gain and Weight Loss. Skin Not Present- Eczema, Hives, Itching, Lesions and Rash. HEENT Not Present- Dentures, Double Vision, Headache, Hearing Loss, Tinnitus and  Visual Loss. Respiratory Not Present- Allergies, Chronic Cough, Coughing up  blood, Shortness of breath at rest and Shortness of breath with exertion. Cardiovascular Not Present- Chest Pain, Difficulty Breathing Lying Down, Murmur, Palpitations, Racing/skipping heartbeats and Swelling. Gastrointestinal Not Present- Abdominal Pain, Bloody Stool, Constipation, Diarrhea, Difficulty Swallowing, Heartburn, Jaundice, Loss of appetitie, Nausea and Vomiting. Male Genitourinary Not Present- Blood in Urine, Discharge, Flank Pain, Incontinence, Painful Urination, Urgency, Urinary frequency, Urinary Retention, Urinating at Night and Weak urinary stream. Musculoskeletal Present- Joint Pain. Not Present- Back Pain, Joint Swelling, Morning Stiffness, Muscle Pain, Muscle Weakness and Spasms. Neurological Not Present- Blackout spells, Difficulty with balance, Dizziness, Paralysis, Tremor and Weakness. Psychiatric Not Present- Insomnia.  Vitals Weight: 310 lb Height: 76in Body Surface Area: 2.67 m Body Mass Index: 37.73 kg/m  Pulse: 64 (Regular)  BP: 138/90 (Sitting, Left Arm, Standard)   Physical Exam  General Mental Status -Alert, cooperative and good historian. General Appearance-pleasant, Not in acute distress. Orientation-Oriented X3. Build & Nutrition-Well nourished and Well developed. Note: tall framed  Head and Neck Head-normocephalic, atraumatic . Neck Global Assessment - supple, no bruit auscultated on the right, no bruit auscultated on the left.  Eye Vision-Wears corrective lenses. Pupil - Bilateral-Regular and Round. Motion - Bilateral-EOMI.  Chest and Lung Exam Auscultation Breath sounds - clear at anterior chest wall and clear at posterior chest wall. Adventitious sounds - No Adventitious sounds.  Cardiovascular Auscultation Rhythm - Regular rate and rhythm. Heart Sounds - S1 WNL and S2 WNL. Murmurs & Other Heart Sounds - Auscultation of the heart reveals - No Murmurs.  Abdomen Inspection Contour - Generalized mild  distention. Palpation/Percussion Tenderness - Abdomen is non-tender to palpation. Rigidity (guarding) - Abdomen is soft. Auscultation Auscultation of the abdomen reveals - Bowel sounds normal.  Male Genitourinary Note: Not done, not pertinent to present illness   Musculoskeletal Note: Left knee shows an old para-medial scar. He has no effusion. His range of motion is 5 to 120 degrees. There is marked crepitus on range of motion with tenderness medial greater than lateral, no instability. Right knee, no effusion, range 0 to 125, marked crepitus on range of motion. Tender medial greater than lateral with no instability. Pulse, sensation and motor intact, both lower extremities. He has antalgic gait pattern on left.  His radiographs, AP of both knees and lateral were reviewed. He has got bone-on-bone arthritis in the medial and patellofemoral compartments. It is a little worse on the left than the right. He has got large osteophyte formation also.   Assessment & Plan  Primary osteoarthritis of left knee (M17.12)  Note:Surgical Plans: Left Total Knee Replacement  Disposition: Home with wife, [facility]  PCP: Dr. Kirt BoysMonica Carter - Patient has been seen preoperatively and felt to be stable for surgery.  IV TXA  Anesthesia Issues: None  Patient was instructed on what medications to stop prior to surgery.  Signed electronically by Lauraine RinneAlexzandrew L Donne Robillard, III PA-C

## 2017-10-23 NOTE — H&P (View-Only) (Signed)
Jason Ingram DOB: [birth year] Married / Language: [place] / Race: White Male Date of Admission:  November 08, 2017  Chief complaint: Left knee pain History of Present Illness The patient is a 61 year old male who comes in  for a preoperative History and Physical. The patient is scheduled for a left total knee arthroplasty to be performed by Dr. Gus RankinFrank V. Aluisio, MD at [facility] on 11-08-2017. The patient is a 61 year old male who presented for follow up of their knee. The patient is being followed for their bilateral knee pain and osteoarthritis. Symptoms reported include: pain (increases w/ walking/activity), aching, stiffness, locking, catching, popping and pain with weightbearing. The patient feels that they are doing poorly and report their pain level to be mild to moderate. The following medication has been used for pain control: antiinflammatory medication - Ibuprofen. The patient has not gotten any relief of their symptoms with Cortisone injections. Jason Ingram has been seen to discuss possible knee replacement. He was found to have end stage arthritis of both knees. His left is more symptomatic than his right. He has been working at [employer] on his feet a lot and he feels like the knee is getting worse. It is hurting at all times. It is limiting what he can and cannot do. He has lost a considerable amount of weight. Despite the weight loss he still has significant discomfort in his knees. He is now at a point where he would like to proceed with surgery. They have been treated conservatively in the past for the above stated problem and despite conservative measures, they continue to have progressive pain and severe functional limitations and dysfunction. They have failed non-operative management including home exercise, medications, and injections. It is felt that they would benefit from undergoing total joint replacement. Risks and benefits of the procedure have been discussed with the  patient and they elect to proceed with surgery. There are no active contraindications to surgery such as ongoing infection or rapidly progressive neurological disease.   Problem List/Past Medical  Calcaneal spur, right (M77.31)  Plantar fasciitis, right (M72.2)  Primary osteoarthritis of left knee (M17.12)  Foot arch pain, right (M79.671)  Depression  High blood pressure  Hypercholesterolemia  Sleep Apnea  uses CPAP   Allergies  No Known Drug Allergies  Family History Cancer  Mother. Hypertension  Father.  Social History Children  2 Current drinker  07/10/2016: Currently drinks wine only occasionally per week Current work status  working full time Exercise  Exercises weekly; does running / walking Living situation  live with spouse Marital status  married No history of drug/alcohol rehab  Not under pain contract  Number of flights of stairs before winded  2-3 Tobacco / smoke exposure  07/10/2016: no Tobacco use  Never smoker. 07/10/2016 Post-Surgical Plans  Home With Family, Home, Straight to Outpatient Therapy. Advance Directives  None.  Medication History Duexis (800-26.6MG  Tablet, Oral as needed) Active. Finasteride (5MG  Tablet, Oral) Active. Tamsulosin Active. Triamterene-HCTZ (37.5-25MG  Capsule, Oral) Active. Simvastatin (40MG  Tablet, Oral) Active. PARoxetine HCl (20MG  Tablet, Oral) Active. CloNIDine HCl (0.3MG  Tablet, Oral) Active. Baclofen (as needed) Active. Centrum Silver (Oral) Active.  Past Surgical History  Straighten Nasal Septum  Tonsillectomy  Left Knee Surgery  1978. 2007   Review of Systems  General Not Present- Chills, Fatigue, Fever, Memory Loss, Night Sweats, Weight Gain and Weight Loss. Skin Not Present- Eczema, Hives, Itching, Lesions and Rash. HEENT Not Present- Dentures, Double Vision, Headache, Hearing Loss, Tinnitus and  Visual Loss. Respiratory Not Present- Allergies, Chronic Cough, Coughing up  blood, Shortness of breath at rest and Shortness of breath with exertion. Cardiovascular Not Present- Chest Pain, Difficulty Breathing Lying Down, Murmur, Palpitations, Racing/skipping heartbeats and Swelling. Gastrointestinal Not Present- Abdominal Pain, Bloody Stool, Constipation, Diarrhea, Difficulty Swallowing, Heartburn, Jaundice, Loss of appetitie, Nausea and Vomiting. Male Genitourinary Not Present- Blood in Urine, Discharge, Flank Pain, Incontinence, Painful Urination, Urgency, Urinary frequency, Urinary Retention, Urinating at Night and Weak urinary stream. Musculoskeletal Present- Joint Pain. Not Present- Back Pain, Joint Swelling, Morning Stiffness, Muscle Pain, Muscle Weakness and Spasms. Neurological Not Present- Blackout spells, Difficulty with balance, Dizziness, Paralysis, Tremor and Weakness. Psychiatric Not Present- Insomnia.  Vitals Weight: 310 lb Height: 76in Body Surface Area: 2.67 m Body Mass Index: 37.73 kg/m  Pulse: 64 (Regular)  BP: 138/90 (Sitting, Left Arm, Standard)   Physical Exam  General Mental Status -Alert, cooperative and good historian. General Appearance-pleasant, Not in acute distress. Orientation-Oriented X3. Build & Nutrition-Well nourished and Well developed. Note: tall framed  Head and Neck Head-normocephalic, atraumatic . Neck Global Assessment - supple, no bruit auscultated on the right, no bruit auscultated on the left.  Eye Vision-Wears corrective lenses. Pupil - Bilateral-Regular and Round. Motion - Bilateral-EOMI.  Chest and Lung Exam Auscultation Breath sounds - clear at anterior chest wall and clear at posterior chest wall. Adventitious sounds - No Adventitious sounds.  Cardiovascular Auscultation Rhythm - Regular rate and rhythm. Heart Sounds - S1 WNL and S2 WNL. Murmurs & Other Heart Sounds - Auscultation of the heart reveals - No Murmurs.  Abdomen Inspection Contour - Generalized mild  distention. Palpation/Percussion Tenderness - Abdomen is non-tender to palpation. Rigidity (guarding) - Abdomen is soft. Auscultation Auscultation of the abdomen reveals - Bowel sounds normal.  Male Genitourinary Note: Not done, not pertinent to present illness   Musculoskeletal Note: Left knee shows an old para-medial scar. He has no effusion. His range of motion is 5 to 120 degrees. There is marked crepitus on range of motion with tenderness medial greater than lateral, no instability. Right knee, no effusion, range 0 to 125, marked crepitus on range of motion. Tender medial greater than lateral with no instability. Pulse, sensation and motor intact, both lower extremities. He has antalgic gait pattern on left.  His radiographs, AP of both knees and lateral were reviewed. He has got bone-on-bone arthritis in the medial and patellofemoral compartments. It is a little worse on the left than the right. He has got large osteophyte formation also.   Assessment & Plan  Primary osteoarthritis of left knee (M17.12)  Note:Surgical Plans: Left Total Knee Replacement  Disposition: Home with wife, Straight to outpatient at [facility] on Dec 7th  PCP: Dr. Monica Carter - Patient has been seen preoperatively and felt to be stable for surgery.  IV TXA  Anesthesia Issues: None  Patient was instructed on what medications to stop prior to surgery.  Signed electronically by Xela Oregel L Evyn Putzier, III PA-C  

## 2017-10-27 NOTE — Patient Instructions (Addendum)
Roxy MannsGeorge Monroy  10/27/2017   Your procedure is scheduled on: 11-08-17  Report to [facility] Main  Entrance Report to Admitting at 6:00 AM.   Call this number if you have problems the morning of surgery [phone]   Remember: ONLY 1 PERSON MAY GO WITH YOU TO SHORT STAY TO GET  READY MORNING OF YOUR SURGERY.  Do not eat food or drink liquids :After Midnight.     Take these medicines the morning of surgery with A SIP OF WATER: Paroxetine (Paxil), Simvastatin (Zocor), and Clonidine (Catapres)                                You may not have any metal on your body including hair pins and              piercings  Do not wear jewelry, make-up, lotions, powders or deodorant             Men may shave face and neck.   Do not bring valuables to the hospital. [facility] IS NOT             RESPONSIBLE   FOR VALUABLES.  Contacts, dentures or bridgework may not be worn into surgery.  Leave suitcase in the car. After surgery it may be brought to your room.   Please bring your mask and tubing for your CPAP machine              Please read over the following fact sheets you were given: _____________________________________________________________________             [facility] - Preparing for Surgery Before surgery, you can play an important role.  Because skin is not sterile, your skin needs to be as free of germs as possible.  You can reduce the number of germs on your skin by washing with CHG (chlorahexidine gluconate) soap before surgery.  CHG is an antiseptic cleaner which kills germs and bonds with the skin to continue killing germs even after washing. Please DO NOT use if you have an allergy to CHG or antibacterial soaps.  If your skin becomes reddened/irritated stop using the CHG and inform your nurse when you arrive at Short Stay. Do not shave (including legs and underarms) for at least 48 hours prior to the first CHG shower.  You may shave your face/neck. Please  follow these instructions carefully:  1.  Shower with CHG Soap the night before surgery and the  morning of Surgery.  2.  If you choose to wash your hair, wash your hair first as usual with your  normal  shampoo.  3.  After you shampoo, rinse your hair and body thoroughly to remove the  shampoo.                           4.  Use CHG as you would any other liquid soap.  You can apply chg directly  to the skin and wash                       Gently with a scrungie or clean washcloth.  5.  Apply the CHG Soap to your body ONLY FROM THE NECK DOWN.   Do not use on face/ open  Wound or open sores. Avoid contact with eyes, ears mouth and genitals (private parts).                       Wash face,  Genitals (private parts) with your normal soap.             6.  Wash thoroughly, paying special attention to the area where your surgery  will be performed.  7.  Thoroughly rinse your body with warm water from the neck down.  8.  DO NOT shower/wash with your normal soap after using and rinsing off  the CHG Soap.                9.  Pat yourself dry with a clean towel.            10.  Wear clean pajamas.            11.  Place clean sheets on your bed the night of your first shower and do not  sleep with pets. Day of Surgery : Do not apply any lotions/deodorants the morning of surgery.  Please wear clean clothes to the hospital/surgery center.  FAILURE TO FOLLOW THESE INSTRUCTIONS MAY RESULT IN THE CANCELLATION OF YOUR SURGERY PATIENT SIGNATURE_________________________________  NURSE SIGNATURE__________________________________  ________________________________________________________________________   Adam Phenix  An incentive spirometer is a tool that can help keep your lungs clear and active. This tool measures how well you are filling your lungs with each breath. Taking long deep breaths may help reverse or decrease the chance of developing breathing (pulmonary) problems  (especially infection) following:  A long period of time when you are unable to move or be active. BEFORE THE PROCEDURE   If the spirometer includes an indicator to show your best effort, your nurse or respiratory therapist will set it to a desired goal.  If possible, sit up straight or lean slightly forward. Try not to slouch.  Hold the incentive spirometer in an upright position. INSTRUCTIONS FOR USE  1. Sit on the edge of your bed if possible, or sit up as far as you can in bed or on a chair. 2. Hold the incentive spirometer in an upright position. 3. Breathe out normally. 4. Place the mouthpiece in your mouth and seal your lips tightly around it. 5. Breathe in slowly and as deeply as possible, raising the piston or the ball toward the top of the column. 6. Hold your breath for 3-5 seconds or for as long as possible. Allow the piston or ball to fall to the bottom of the column. 7. Remove the mouthpiece from your mouth and breathe out normally. 8. Rest for a few seconds and repeat Steps 1 through 7 at least 10 times every 1-2 hours when you are awake. Take your time and take a few normal breaths between deep breaths. 9. The spirometer may include an indicator to show your best effort. Use the indicator as a goal to work toward during each repetition. 10. After each set of 10 deep breaths, practice coughing to be sure your lungs are clear. If you have an incision (the cut made at the time of surgery), support your incision when coughing by placing a pillow or rolled up towels firmly against it. Once you are able to get out of bed, walk around indoors and cough well. You may stop using the incentive spirometer when instructed by your caregiver.  RISKS AND COMPLICATIONS  Take your time so you do not get  dizzy or light-headed.  If you are in pain, you may need to take or ask for pain medication before doing incentive spirometry. It is harder to take a deep breath if you are having  pain. AFTER USE  Rest and breathe slowly and easily.  It can be helpful to keep track of a log of your progress. Your caregiver can provide you with a simple table to help with this. If you are using the spirometer at home, follow these instructions: [place] IF:   You are having difficultly using the spirometer.  You have trouble using the spirometer as often as instructed.  Your pain medication is not giving enough relief while using the spirometer.  You develop fever of 100.5 F (38.1 C) or higher. SEEK IMMEDIATE MEDICAL CARE IF:   You cough up bloody sputum that had not been present before.  You develop fever of 102 F (38.9 C) or greater.  You develop worsening pain at or near the incision site. MAKE SURE YOU:   Understand these instructions.  Will watch your condition.  Will get help right away if you are not doing well or get worse. Document Released: 04/05/2007 Document Revised: 02/15/2012 Document Reviewed: 06/06/2007 Exit[employer] Patient Information 2014 [employer], [place].   ________________________________________________________________________  WHAT IS A BLOOD TRANSFUSION? Blood Transfusion Information  A transfusion is the replacement of blood or some of its parts. Blood is made up of multiple cells which provide different functions.  Red blood cells carry oxygen and are used for blood loss replacement.  White blood cells fight against infection.  Platelets control bleeding.  Plasma helps clot blood.  Other blood products are available for specialized needs, such as hemophilia or other clotting disorders. BEFORE THE TRANSFUSION  Who gives blood for transfusions?   Healthy volunteers who are fully evaluated to make sure their blood is safe. This is blood bank blood. Transfusion therapy is the safest it has ever been in the practice of medicine. Before blood is taken from a donor, a complete history is taken to make sure that person has no history  of diseases nor engages in risky social behavior (examples are intravenous drug use or sexual activity with multiple partners). The donor's travel history is screened to minimize risk of transmitting infections, such as malaria. The donated blood is tested for signs of infectious diseases, such as HIV and hepatitis. The blood is then tested to be sure it is compatible with you in order to minimize the chance of a transfusion reaction. If you or a relative donates blood, this is often done in anticipation of surgery and is not appropriate for emergency situations. It takes many days to process the donated blood. RISKS AND COMPLICATIONS Although transfusion therapy is very safe and saves many lives, the main dangers of transfusion include:   Getting an infectious disease.  Developing a transfusion reaction. This is an allergic reaction to something in the blood you were given. Every precaution is taken to prevent this. The decision to have a blood transfusion has been considered carefully by your caregiver before blood is given. Blood is not given unless the benefits outweigh the risks. AFTER THE TRANSFUSION  Right after receiving a blood transfusion, you will usually feel much better and more energetic. This is especially true if your red blood cells have gotten low (anemic). The transfusion raises the level of the red blood cells which carry oxygen, and this usually causes an energy increase.  The nurse administering the transfusion will  monitor you carefully for complications. HOME CARE INSTRUCTIONS  No special instructions are needed after a transfusion. You may find your energy is better. Speak with your caregiver about any limitations on activity for underlying diseases you may have. SEEK MEDICAL CARE IF:   Your condition is not improving after your transfusion.  You develop redness or irritation at the intravenous (IV) site. SEEK IMMEDIATE MEDICAL CARE IF:  Any of the following symptoms  occur over the next 12 hours:  Shaking chills.  You have a temperature by mouth above 102 F (38.9 C), not controlled by medicine.  Chest, back, or muscle pain.  People around you feel you are not acting correctly or are confused.  Shortness of breath or difficulty breathing.  Dizziness and fainting.  You get a rash or develop hives.  You have a decrease in urine output.  Your urine turns a dark color or changes to pink, red, or brown. Any of the following symptoms occur over the next 10 days:  You have a temperature by mouth above 102 F (38.9 C), not controlled by medicine.  Shortness of breath.  Weakness after normal activity.  The white part of the eye turns yellow (jaundice).  You have a decrease in the amount of urine or are urinating less often.  Your urine turns a dark color or changes to pink, red, or brown. Document Released: 11/20/2000 Document Revised: 02/15/2012 Document Reviewed: 07/09/2008 [facility] Patient Information 2014 [place], [place].  _______________________________________________________________________

## 2017-11-03 ENCOUNTER — Encounter (HOSPITAL_COMMUNITY)
Admission: RE | Admit: 2017-11-03 | Discharge: 2017-11-03 | Disposition: A | Discharge status: Home / Self Care | Payer: BLUE CROSS/BLUE SHIELD | Source: Ambulatory Visit | Attending: Orthopedic Surgery | Admitting: Orthopedic Surgery

## 2017-11-03 ENCOUNTER — Encounter (HOSPITAL_COMMUNITY): Payer: Self-pay

## 2017-11-03 ENCOUNTER — Other Ambulatory Visit: Payer: Self-pay

## 2017-11-03 DIAGNOSIS — M1712 Unilateral primary osteoarthritis, left knee: Secondary | ICD-10-CM | POA: Insufficient documentation

## 2017-11-03 DIAGNOSIS — Z01812 Encounter for preprocedural laboratory examination: Secondary | ICD-10-CM | POA: Diagnosis not present

## 2017-11-03 LAB — COMPREHENSIVE METABOLIC PANEL
ALBUMIN: 4 g/dL (ref 3.5–5.0)
ALT: 34 U/L (ref 17–63)
AST: 28 U/L (ref 15–41)
Alkaline Phosphatase: 64 U/L (ref 38–126)
Anion gap: 8 (ref 5–15)
BILIRUBIN TOTAL: 0.6 mg/dL (ref 0.3–1.2)
BUN: 15 mg/dL (ref 6–20)
CHLORIDE: 101 mmol/L (ref 101–111)
CO2: 29 mmol/L (ref 22–32)
Calcium: 9.5 mg/dL (ref 8.9–10.3)
Creatinine, Ser: 0.95 mg/dL (ref 0.61–1.24)
GFR calc Af Amer: >60 mL/min (ref >60)
GFR calc non Af Amer: >60 mL/min (ref >60)
GLUCOSE: 114 mg/dL — AB (ref 65–99)
POTASSIUM: 3.6 mmol/L (ref 3.5–5.1)
Sodium: 138 mmol/L (ref 135–145)
TOTAL PROTEIN: 7 g/dL (ref 6.5–8.1)

## 2017-11-03 LAB — CBC
HCT: 42.9 % (ref 39.0–52.0)
Hemoglobin: 14.4 g/dL (ref 13.0–17.0)
MCH: 29.6 pg (ref 26.0–34.0)
MCHC: 33.6 g/dL (ref 30.0–36.0)
MCV: 88.1 fL (ref 78.0–100.0)
PLATELETS: 229 10*3/uL (ref 150–400)
RBC: 4.87 MIL/uL (ref 4.22–5.81)
RDW: 13.7 % (ref 11.5–15.5)
WBC: 7 10*3/uL (ref 4.0–10.5)

## 2017-11-03 LAB — APTT: APTT: 27 s (ref 24–36)

## 2017-11-03 LAB — PROTIME-INR
INR: 0.94
Prothrombin Time: 12.5 seconds (ref 11.4–15.2)

## 2017-11-03 LAB — SURGICAL PCR SCREEN
MRSA, PCR: NEGATIVE
STAPHYLOCOCCUS AUREUS: NEGATIVE

## 2017-11-03 LAB — ABO/RH: ABO/RH(D): A POS

## 2017-11-07 ENCOUNTER — Encounter (HOSPITAL_COMMUNITY): Payer: Self-pay | Admitting: Anesthesiology

## 2017-11-07 MED ORDER — CEFAZOLIN SODIUM 10 G IJ SOLR
3.0000 g | INTRAMUSCULAR | Status: AC
  Administered 2017-11-08: 3 g via INTRAVENOUS
  Filled 2017-11-07: qty 3000
  Filled 2017-11-07: qty 3

## 2017-11-07 NOTE — Anesthesia Preprocedure Evaluation (Addendum)
Anesthesia Evaluation  Patient identified by MRN, date of birth, ID band Patient awake    Reviewed: Allergy & Precautions, NPO status , Patient's Chart, lab work & pertinent test results  Airway Mallampati: II  TM Distance: >3 FB Neck ROM: Full    Dental  (+) Teeth Intact, Dental Advisory Given   Pulmonary sleep apnea ,    breath sounds clear to auscultation       Cardiovascular hypertension, Pt. on medications  Rhythm:Regular Rate:Normal     Neuro/Psych PSYCHIATRIC DISORDERS Depression negative neurological ROS     GI/Hepatic negative GI ROS, Neg liver ROS,   Endo/Other  negative endocrine ROS  Renal/GU negative Renal ROS     Musculoskeletal negative musculoskeletal ROS (+)   Abdominal (+) + obese,   Peds  Hematology negative hematology ROS (+)   Anesthesia Other Findings Day of surgery medications reviewed with the patient.  Reproductive/Obstetrics                            Lab Results  Component Value Date   WBC 7.0 11/03/2017   HGB 14.4 11/03/2017   HCT 42.9 11/03/2017   MCV 88.1 11/03/2017   PLT 229 11/03/2017   EKG: NSR  Anesthesia Physical Anesthesia Plan  ASA: II  Anesthesia Plan: Spinal   Post-op Pain Management:  Regional for Post-op pain   Induction: Intravenous  PONV Risk Score and Plan: 2 and Propofol infusion, Ondansetron and Dexamethasone  Airway Management Planned: Natural Airway and Simple Face Mask  Additional Equipment:   Intra-op Plan:   Post-operative Plan:   Informed Consent: I have reviewed the patients History and Physical, chart, labs and discussed the procedure including the risks, benefits and alternatives for the proposed anesthesia with the patient or authorized representative who has indicated his/her understanding and acceptance.     Plan Discussed with:   Anesthesia Plan Comments:         Anesthesia Quick Evaluation

## 2017-11-08 ENCOUNTER — Encounter (HOSPITAL_COMMUNITY)
Admission: RE | Disposition: A | Discharge status: Home / Self Care | Payer: Self-pay | Source: Ambulatory Visit | Attending: Orthopedic Surgery

## 2017-11-08 ENCOUNTER — Inpatient Hospital Stay (HOSPITAL_COMMUNITY): Payer: BLUE CROSS/BLUE SHIELD | Admitting: Anesthesiology

## 2017-11-08 ENCOUNTER — Encounter (HOSPITAL_COMMUNITY): Payer: Self-pay | Admitting: Certified Registered Nurse Anesthetist

## 2017-11-08 ENCOUNTER — Inpatient Hospital Stay (HOSPITAL_COMMUNITY)
Admission: RE | Admit: 2017-11-08 | Discharge: 2017-11-10 | DRG: 470 | Disposition: A | Discharge status: Home / Self Care | Payer: BLUE CROSS/BLUE SHIELD | Source: Ambulatory Visit | Attending: Orthopedic Surgery | Admitting: Orthopedic Surgery

## 2017-11-08 ENCOUNTER — Other Ambulatory Visit: Payer: Self-pay

## 2017-11-08 DIAGNOSIS — E669 Obesity, unspecified: Secondary | ICD-10-CM | POA: Diagnosis not present

## 2017-11-08 DIAGNOSIS — M25562 Pain in left knee: Secondary | ICD-10-CM | POA: Diagnosis not present

## 2017-11-08 DIAGNOSIS — I10 Essential (primary) hypertension: Secondary | ICD-10-CM | POA: Diagnosis present

## 2017-11-08 DIAGNOSIS — G4730 Sleep apnea, unspecified: Secondary | ICD-10-CM | POA: Diagnosis present

## 2017-11-08 DIAGNOSIS — M1712 Unilateral primary osteoarthritis, left knee: Principal | ICD-10-CM | POA: Diagnosis present

## 2017-11-08 DIAGNOSIS — M1710 Unilateral primary osteoarthritis, unspecified knee: Secondary | ICD-10-CM

## 2017-11-08 DIAGNOSIS — Z6837 Body mass index (BMI) 37.0-37.9, adult: Secondary | ICD-10-CM

## 2017-11-08 DIAGNOSIS — G8918 Other acute postprocedural pain: Secondary | ICD-10-CM | POA: Diagnosis not present

## 2017-11-08 DIAGNOSIS — M179 Osteoarthritis of knee, unspecified: Secondary | ICD-10-CM | POA: Diagnosis present

## 2017-11-08 DIAGNOSIS — E7800 Pure hypercholesterolemia, unspecified: Secondary | ICD-10-CM | POA: Diagnosis not present

## 2017-11-08 DIAGNOSIS — Z79899 Other long term (current) drug therapy: Secondary | ICD-10-CM | POA: Diagnosis not present

## 2017-11-08 DIAGNOSIS — F329 Major depressive disorder, single episode, unspecified: Secondary | ICD-10-CM | POA: Diagnosis not present

## 2017-11-08 DIAGNOSIS — E782 Mixed hyperlipidemia: Secondary | ICD-10-CM | POA: Diagnosis not present

## 2017-11-08 HISTORY — PX: TOTAL KNEE ARTHROPLASTY: SHX125

## 2017-11-08 LAB — TYPE AND SCREEN
ABO/RH(D): A POS
ANTIBODY SCREEN: NEGATIVE

## 2017-11-08 SURGERY — ARTHROPLASTY, KNEE, TOTAL
Anesthesia: Spinal | Site: Knee | Laterality: Left

## 2017-11-08 MED ORDER — PROPOFOL 500 MG/50ML IV EMUL
INTRAVENOUS | Status: DC | PRN
  Administered 2017-11-08: 100 ug/kg/min via INTRAVENOUS

## 2017-11-08 MED ORDER — ONDANSETRON HCL 4 MG/2ML IJ SOLN
INTRAMUSCULAR | Status: DC | PRN
  Administered 2017-11-08: 4 mg via INTRAVENOUS

## 2017-11-08 MED ORDER — PROPOFOL 10 MG/ML IV BOLUS
INTRAVENOUS | Status: AC
  Filled 2017-11-08: qty 60

## 2017-11-08 MED ORDER — MENTHOL 3 MG MT LOZG: 1.0000 | LOZENGE | OROMUCOSAL | Status: DC | PRN

## 2017-11-08 MED ORDER — BISACODYL 10 MG RE SUPP: 10.0000 mg | Freq: Every day | RECTAL | Status: DC | PRN

## 2017-11-08 MED ORDER — SODIUM CHLORIDE 0.9 % IV SOLN
INTRAVENOUS | Status: DC
  Administered 2017-11-08: 14:00:00 via INTRAVENOUS

## 2017-11-08 MED ORDER — ONDANSETRON HCL 4 MG PO TABS: 4.0000 mg | ORAL_TABLET | Freq: Four times a day (QID) | ORAL | Status: DC | PRN

## 2017-11-08 MED ORDER — DEXAMETHASONE SODIUM PHOSPHATE 10 MG/ML IJ SOLN
10.0000 mg | Freq: Once | INTRAMUSCULAR | Status: AC
  Administered 2017-11-09: 10 mg via INTRAVENOUS
  Filled 2017-11-08: qty 1

## 2017-11-08 MED ORDER — SODIUM CHLORIDE 0.9 % IR SOLN
Status: DC | PRN
  Administered 2017-11-08: 1000 mL

## 2017-11-08 MED ORDER — DOCUSATE SODIUM 100 MG PO CAPS
100.0000 mg | ORAL_CAPSULE | Freq: Two times a day (BID) | ORAL | Status: DC
  Administered 2017-11-08 – 2017-11-10 (×4): 100 mg via ORAL
  Filled 2017-11-08 (×4): qty 1

## 2017-11-08 MED ORDER — ONDANSETRON HCL 4 MG/2ML IJ SOLN: 4.0000 mg | Freq: Four times a day (QID) | INTRAMUSCULAR | Status: DC | PRN

## 2017-11-08 MED ORDER — BUPIVACAINE LIPOSOME 1.3 % IJ SUSP
INTRAMUSCULAR | Status: DC | PRN
  Administered 2017-11-08: 20 mL

## 2017-11-08 MED ORDER — ROPIVACAINE HCL 7.5 MG/ML IJ SOLN
INTRAMUSCULAR | Status: DC | PRN
  Administered 2017-11-08: 20 mL via PERINEURAL

## 2017-11-08 MED ORDER — PROPOFOL 10 MG/ML IV BOLUS
INTRAVENOUS | Status: DC | PRN
  Administered 2017-11-08 (×2): 20 mg via INTRAVENOUS

## 2017-11-08 MED ORDER — CEFAZOLIN SODIUM-DEXTROSE 2-4 GM/100ML-% IV SOLN
2.0000 g | Freq: Four times a day (QID) | INTRAVENOUS | Status: AC
  Administered 2017-11-08 (×2): 2 g via INTRAVENOUS
  Filled 2017-11-08 (×2): qty 100

## 2017-11-08 MED ORDER — SODIUM CHLORIDE 0.9 % IJ SOLN
INTRAMUSCULAR | Status: AC
  Filled 2017-11-08: qty 10

## 2017-11-08 MED ORDER — BACLOFEN 10 MG PO TABS: 10.0000 mg | ORAL_TABLET | Freq: Three times a day (TID) | ORAL | Status: DC | PRN

## 2017-11-08 MED ORDER — SODIUM CHLORIDE 0.9 % IJ SOLN
INTRAMUSCULAR | Status: DC | PRN
  Administered 2017-11-08: 60 mL

## 2017-11-08 MED ORDER — ONDANSETRON HCL 4 MG/2ML IJ SOLN
INTRAMUSCULAR | Status: AC
  Filled 2017-11-08: qty 2

## 2017-11-08 MED ORDER — TAMSULOSIN HCL 0.4 MG PO CAPS
0.4000 mg | ORAL_CAPSULE | Freq: Every day | ORAL | Status: DC
  Administered 2017-11-09 – 2017-11-10 (×2): 0.4 mg via ORAL
  Filled 2017-11-08 (×2): qty 1

## 2017-11-08 MED ORDER — ACETAMINOPHEN 325 MG PO TABS: 650.0000 mg | ORAL_TABLET | ORAL | Status: DC | PRN

## 2017-11-08 MED ORDER — ACETAMINOPHEN 650 MG RE SUPP: 650.0000 mg | RECTAL | Status: DC | PRN

## 2017-11-08 MED ORDER — CHLORHEXIDINE GLUCONATE 4 % EX LIQD: 60.0000 mL | Freq: Once | CUTANEOUS | Status: DC

## 2017-11-08 MED ORDER — ACETAMINOPHEN 500 MG PO TABS
1000.0000 mg | ORAL_TABLET | Freq: Four times a day (QID) | ORAL | Status: AC
  Administered 2017-11-08 – 2017-11-09 (×3): 1000 mg via ORAL
  Filled 2017-11-08 (×4): qty 2

## 2017-11-08 MED ORDER — SIMVASTATIN 20 MG PO TABS
40.0000 mg | ORAL_TABLET | Freq: Every day | ORAL | Status: DC
  Administered 2017-11-09: 40 mg via ORAL
  Filled 2017-11-08 (×2): qty 2

## 2017-11-08 MED ORDER — MIDAZOLAM HCL 2 MG/2ML IJ SOLN
INTRAMUSCULAR | Status: AC
  Administered 2017-11-08: 2 mg via INTRAVENOUS
  Filled 2017-11-08: qty 2

## 2017-11-08 MED ORDER — SODIUM CHLORIDE 0.9 % IJ SOLN
INTRAMUSCULAR | Status: AC
  Filled 2017-11-08: qty 50

## 2017-11-08 MED ORDER — PHENOL 1.4 % MT LIQD: 1.0000 | OROMUCOSAL | Status: DC | PRN

## 2017-11-08 MED ORDER — BUPIVACAINE IN DEXTROSE 0.75-8.25 % IT SOLN
INTRATHECAL | Status: DC | PRN
  Administered 2017-11-08: 2 mL via INTRATHECAL

## 2017-11-08 MED ORDER — MORPHINE SULFATE (PF) 4 MG/ML IV SOLN
1.0000 mg | INTRAVENOUS | Status: DC | PRN
  Administered 2017-11-08 – 2017-11-09 (×3): 1 mg via INTRAVENOUS
  Filled 2017-11-08 (×3): qty 1

## 2017-11-08 MED ORDER — TRANEXAMIC ACID 1000 MG/10ML IV SOLN
1000.0000 mg | INTRAVENOUS | Status: AC
  Administered 2017-11-08: 1000 mg via INTRAVENOUS
  Filled 2017-11-08: qty 1100

## 2017-11-08 MED ORDER — METOCLOPRAMIDE HCL 5 MG/ML IJ SOLN: 5.0000 mg | Freq: Three times a day (TID) | INTRAMUSCULAR | Status: DC | PRN

## 2017-11-08 MED ORDER — GABAPENTIN 300 MG PO CAPS
ORAL_CAPSULE | ORAL | Status: AC
  Administered 2017-11-08: 300 mg via ORAL
  Filled 2017-11-08: qty 1

## 2017-11-08 MED ORDER — TRANEXAMIC ACID 1000 MG/10ML IV SOLN
1000.0000 mg | Freq: Once | INTRAVENOUS | Status: AC
  Administered 2017-11-08: 1000 mg via INTRAVENOUS
  Filled 2017-11-08: qty 1100

## 2017-11-08 MED ORDER — TRIAMTERENE-HCTZ 37.5-25 MG PO CAPS
1.0000 | ORAL_CAPSULE | Freq: Every day | ORAL | Status: DC
  Administered 2017-11-09 – 2017-11-10 (×2): 1 via ORAL
  Filled 2017-11-08 (×4): qty 1

## 2017-11-08 MED ORDER — METHOCARBAMOL 1000 MG/10ML IJ SOLN
500.0000 mg | Freq: Four times a day (QID) | INTRAVENOUS | Status: DC | PRN
  Administered 2017-11-08: 500 mg via INTRAVENOUS
  Filled 2017-11-08: qty 550

## 2017-11-08 MED ORDER — POLYETHYLENE GLYCOL 3350 17 G PO PACK: 17.0000 g | PACK | Freq: Every day | ORAL | Status: DC | PRN

## 2017-11-08 MED ORDER — FINASTERIDE 5 MG PO TABS
5.0000 mg | ORAL_TABLET | Freq: Every day | ORAL | Status: DC
  Administered 2017-11-09 – 2017-11-10 (×2): 5 mg via ORAL
  Filled 2017-11-08 (×2): qty 1

## 2017-11-08 MED ORDER — OXYCODONE HCL 5 MG PO TABS
10.0000 mg | ORAL_TABLET | ORAL | Status: DC | PRN
  Administered 2017-11-08 – 2017-11-10 (×12): 10 mg via ORAL
  Filled 2017-11-08 (×12): qty 2

## 2017-11-08 MED ORDER — METHOCARBAMOL 500 MG PO TABS
500.0000 mg | ORAL_TABLET | Freq: Four times a day (QID) | ORAL | Status: DC | PRN
  Administered 2017-11-08 – 2017-11-10 (×6): 500 mg via ORAL
  Filled 2017-11-08 (×6): qty 1

## 2017-11-08 MED ORDER — METOCLOPRAMIDE HCL 5 MG PO TABS: 5.0000 mg | ORAL_TABLET | Freq: Three times a day (TID) | ORAL | Status: DC | PRN

## 2017-11-08 MED ORDER — DIPHENHYDRAMINE HCL 12.5 MG/5ML PO ELIX: 12.5000 mg | ORAL_SOLUTION | ORAL | Status: DC | PRN

## 2017-11-08 MED ORDER — CLONIDINE HCL 0.1 MG PO TABS
0.3000 mg | ORAL_TABLET | Freq: Every day | ORAL | Status: DC
  Administered 2017-11-09 – 2017-11-10 (×2): 0.3 mg via ORAL
  Filled 2017-11-08 (×2): qty 3

## 2017-11-08 MED ORDER — OXYCODONE HCL 5 MG PO TABS: 5.0000 mg | ORAL_TABLET | ORAL | Status: DC | PRN

## 2017-11-08 MED ORDER — LACTATED RINGERS IV SOLN
INTRAVENOUS | Status: DC
  Administered 2017-11-08 (×3): via INTRAVENOUS

## 2017-11-08 MED ORDER — RIVAROXABAN 10 MG PO TABS
10.0000 mg | ORAL_TABLET | Freq: Every day | ORAL | Status: DC
  Administered 2017-11-09 – 2017-11-10 (×2): 10 mg via ORAL
  Filled 2017-11-08 (×2): qty 1

## 2017-11-08 MED ORDER — FENTANYL CITRATE (PF) 100 MCG/2ML IJ SOLN
50.0000 ug | INTRAMUSCULAR | Status: DC
  Administered 2017-11-08 (×2): 50 ug via INTRAVENOUS

## 2017-11-08 MED ORDER — MIDAZOLAM HCL 2 MG/2ML IJ SOLN
1.0000 mg | INTRAMUSCULAR | Status: DC
  Administered 2017-11-08: 2 mg via INTRAVENOUS

## 2017-11-08 MED ORDER — ACETAMINOPHEN 10 MG/ML IV SOLN
INTRAVENOUS | Status: AC
  Filled 2017-11-08: qty 100

## 2017-11-08 MED ORDER — FENTANYL CITRATE (PF) 100 MCG/2ML IJ SOLN
INTRAMUSCULAR | Status: AC
  Administered 2017-11-08: 50 ug via INTRAVENOUS
  Filled 2017-11-08: qty 2

## 2017-11-08 MED ORDER — GABAPENTIN 300 MG PO CAPS
300.0000 mg | ORAL_CAPSULE | Freq: Once | ORAL | Status: AC
  Administered 2017-11-08: 300 mg via ORAL

## 2017-11-08 MED ORDER — PAROXETINE HCL 20 MG PO TABS
20.0000 mg | ORAL_TABLET | Freq: Every day | ORAL | Status: DC
  Administered 2017-11-09 – 2017-11-10 (×2): 20 mg via ORAL
  Filled 2017-11-08 (×2): qty 1

## 2017-11-08 MED ORDER — PROPOFOL 10 MG/ML IV BOLUS
INTRAVENOUS | Status: AC
  Filled 2017-11-08: qty 40

## 2017-11-08 MED ORDER — ACETAMINOPHEN 10 MG/ML IV SOLN
1000.0000 mg | Freq: Once | INTRAVENOUS | Status: AC
  Administered 2017-11-08: 1000 mg via INTRAVENOUS

## 2017-11-08 MED ORDER — GABAPENTIN 300 MG PO CAPS
300.0000 mg | ORAL_CAPSULE | Freq: Three times a day (TID) | ORAL | Status: DC
  Administered 2017-11-08 – 2017-11-10 (×6): 300 mg via ORAL
  Filled 2017-11-08 (×6): qty 1

## 2017-11-08 MED ORDER — DEXAMETHASONE SODIUM PHOSPHATE 10 MG/ML IJ SOLN
10.0000 mg | Freq: Once | INTRAMUSCULAR | Status: AC
  Administered 2017-11-08: 10 mg via INTRAVENOUS

## 2017-11-08 MED ORDER — FLEET ENEMA 7-19 GM/118ML RE ENEM: 1.0000 | ENEMA | Freq: Once | RECTAL | Status: DC | PRN

## 2017-11-08 MED ORDER — BUPIVACAINE LIPOSOME 1.3 % IJ SUSP
20.0000 mL | Freq: Once | INTRAMUSCULAR | Status: DC
  Filled 2017-11-08: qty 20

## 2017-11-08 SURGICAL SUPPLY — 46 items
BAG ZIPLOCK 12X15 (MISCELLANEOUS) IMPLANT
BANDAGE ACE 6X5 VEL STRL LF (GAUZE/BANDAGES/DRESSINGS) ×2 IMPLANT
BLADE SAG 18X100X1.27 (BLADE) ×2 IMPLANT
BLADE SAW SGTL 11.0X1.19X90.0M (BLADE) ×2 IMPLANT
BOWL SMART MIX CTS (DISPOSABLE) ×2 IMPLANT
CAPT KNEE TOTAL 3 ATTUNE ×2 IMPLANT
CEMENT HV SMART SET (Cement) ×4 IMPLANT
COVER SURGICAL LIGHT HANDLE (MISCELLANEOUS) ×2 IMPLANT
CUFF TOURN SGL QUICK 34 (TOURNIQUET CUFF) ×1
CUFF TRNQT CYL 34X4X40X1 (TOURNIQUET CUFF) ×1 IMPLANT
DECANTER SPIKE VIAL GLASS SM (MISCELLANEOUS) ×2 IMPLANT
DRAPE U-SHAPE 47X51 STRL (DRAPES) ×2 IMPLANT
DRSG ADAPTIC 3X8 NADH LF (GAUZE/BANDAGES/DRESSINGS) ×2 IMPLANT
DRSG PAD ABDOMINAL 8X10 ST (GAUZE/BANDAGES/DRESSINGS) ×2 IMPLANT
DURAPREP 26ML APPLICATOR (WOUND CARE) ×2 IMPLANT
ELECT REM PT RETURN 15FT ADLT (MISCELLANEOUS) ×2 IMPLANT
EVACUATOR 1/8 PVC DRAIN (DRAIN) ×2 IMPLANT
GAUZE SPONGE 4X4 12PLY STRL (GAUZE/BANDAGES/DRESSINGS) ×2 IMPLANT
GLOVE BIO SURGEON STRL SZ7.5 (GLOVE) ×2 IMPLANT
GLOVE BIO SURGEON STRL SZ8 (GLOVE) ×2 IMPLANT
GLOVE BIOGEL PI IND STRL 6.5 (GLOVE) ×1 IMPLANT
GLOVE BIOGEL PI IND STRL 8 (GLOVE) ×2 IMPLANT
GLOVE BIOGEL PI INDICATOR 6.5 (GLOVE) ×1
GLOVE BIOGEL PI INDICATOR 8 (GLOVE) ×2
GOWN STRL REUS W/TWL LRG LVL3 (GOWN DISPOSABLE) ×2 IMPLANT
GOWN STRL REUS W/TWL XL LVL3 (GOWN DISPOSABLE) ×2 IMPLANT
HANDPIECE INTERPULSE COAX TIP (DISPOSABLE) ×1
IMMOBILIZER KNEE 20 (SOFTGOODS) ×2
IMMOBILIZER KNEE 20 THIGH 36 (SOFTGOODS) ×1 IMPLANT
MANIFOLD NEPTUNE II (INSTRUMENTS) ×2 IMPLANT
PACK TOTAL KNEE CUSTOM (KITS) ×2 IMPLANT
PAD ABD 8X10 STRL (GAUZE/BANDAGES/DRESSINGS) ×2 IMPLANT
PADDING CAST COTTON 6X4 STRL (CAST SUPPLIES) ×2 IMPLANT
POSITIONER SURGICAL ARM (MISCELLANEOUS) ×2 IMPLANT
SET HNDPC FAN SPRY TIP SCT (DISPOSABLE) ×1 IMPLANT
STRIP CLOSURE SKIN 1/2X4 (GAUZE/BANDAGES/DRESSINGS) ×2 IMPLANT
SUT MNCRL AB 4-0 PS2 18 (SUTURE) ×2 IMPLANT
SUT STRATAFIX 0 PDS 27 VIOLET (SUTURE) ×2
SUT VIC AB 2-0 CT1 27 (SUTURE) ×3
SUT VIC AB 2-0 CT1 TAPERPNT 27 (SUTURE) ×3 IMPLANT
SUTURE STRATFX 0 PDS 27 VIOLET (SUTURE) ×1 IMPLANT
SYR 30ML LL (SYRINGE) ×2 IMPLANT
TRAY FOLEY W/METER SILVER 16FR (SET/KITS/TRAYS/PACK) ×2 IMPLANT
WATER STERILE IRR 1000ML POUR (IV SOLUTION) ×4 IMPLANT
WRAP KNEE MAXI GEL POST OP (GAUZE/BANDAGES/DRESSINGS) ×2 IMPLANT
YANKAUER SUCT BULB TIP 10FT TU (MISCELLANEOUS) ×2 IMPLANT

## 2017-11-08 NOTE — Discharge Instructions (Addendum)
° °Dr. Frank Aluisio °Total Joint Specialist °[facility] °[address] °[place], [place] [address] °[phone] ° °TOTAL KNEE REPLACEMENT POSTOPERATIVE DIRECTIONS ° °Knee Rehabilitation, Guidelines Following Surgery  °Results after knee surgery are often greatly improved when you follow the exercise, range of motion and muscle strengthening exercises prescribed by your doctor. Safety measures are also important to protect the knee from further injury. Any time any of these exercises cause you to have increased pain or swelling in your knee joint, decrease the amount until you are comfortable again and slowly increase them. If you have problems or questions, call your caregiver or physical therapist for advice.  ° °HOME CARE INSTRUCTIONS  °Remove items at home which could result in a fall. This includes throw rugs or furniture in walking pathways.  °· ICE to the affected knee every three hours for 30 minutes at a time and then as needed for pain and swelling.  Continue to use ice on the knee for pain and swelling from surgery. You may notice swelling that will progress down to the foot and ankle.  This is normal after surgery.  Elevate the leg when you are not up walking on it.   °· Continue to use the breathing machine which will help keep your temperature down.  It is common for your temperature to cycle up and down following surgery, especially at night when you are not up moving around and exerting yourself.  The breathing machine keeps your lungs expanded and your temperature down. °· Do not place pillow under knee, focus on keeping the knee straight while resting ° °DIET °You may resume your previous home diet once your are discharged from the hospital. ° °DRESSING / WOUND CARE / SHOWERING °You may shower 3 days after surgery, but keep the wounds dry during showering.  You may use an occlusive plastic wrap (Press'n Seal for example), NO SOAKING/SUBMERGING IN THE BATHTUB.  If the  bandage gets wet, change with a clean dry gauze.  If the incision gets wet, pat the wound dry with a clean towel. °You may start showering once you are discharged home but do not submerge the incision under water. Just pat the incision dry and apply a dry gauze dressing on daily. °Change the surgical dressing daily and reapply a dry dressing each time. ° °ACTIVITY °Walk with your walker as instructed. °Use walker as long as suggested by your caregivers. °Avoid periods of inactivity such as sitting longer than an hour when not asleep. This helps prevent blood clots.  °You may resume a sexual relationship in one month or when given the OK by your doctor.  °You may return to work once you are cleared by your doctor.  °Do not drive a car for 6 weeks or until released by you surgeon.  °Do not drive while taking narcotics. ° °WEIGHT BEARING °Weight bearing as tolerated with assist device (walker, cane, etc) as directed, use it as long as suggested by your surgeon or therapist, typically at least 4-6 weeks. ° °POSTOPERATIVE CONSTIPATION PROTOCOL °Constipation - defined medically as fewer than three stools per week and severe constipation as less than one stool per week. ° °One of the most common issues patients have following surgery is constipation.  Even if you have a regular bowel pattern at home, your normal regimen is likely to be disrupted due to multiple reasons following surgery.  Combination of anesthesia, postoperative narcotics, change in appetite and fluid intake all can affect your bowels.    In order to avoid complications following surgery, here are some recommendations in order to help you during your recovery period. ° °Colace (docusate) - Pick up an over-the-counter form of Colace or another stool softener and take twice a day as long as you are requiring postoperative pain medications.  Take with a full glass of water daily.  If you experience loose stools or diarrhea, hold the colace until you stool forms  back up.  If your symptoms do not get better within 1 week or if they get worse, check with your doctor. ° °Dulcolax (bisacodyl) - Pick up over-the-counter and take as directed by the product packaging as needed to assist with the movement of your bowels.  Take with a full glass of water.  Use this product as needed if not relieved by Colace only.  ° °MiraLax (polyethylene glycol) - Pick up over-the-counter to have on hand.  MiraLax is a solution that will increase the amount of water in your bowels to assist with bowel movements.  Take as directed and can mix with a glass of water, juice, soda, coffee, or tea.  Take if you go more than two days without a movement. °Do not use MiraLax more than once per day. Call your doctor if you are still constipated or irregular after using this medication for 7 days in a row. ° °If you continue to have problems with postoperative constipation, please contact the office for further assistance and recommendations.  If you experience "the worst abdominal pain ever" or develop nausea or vomiting, please contact the office immediatly for further recommendations for treatment. ° °ITCHING ° If you experience itching with your medications, try taking only a single pain pill, or even half a pain pill at a time.  You can also use Benadryl over the counter for itching or also to help with sleep.  ° °TED HOSE STOCKINGS °Wear the elastic stockings on both legs for three weeks following surgery during the day but you may remove then at night for sleeping. ° °MEDICATIONS °See your medication summary on the “After Visit Summary” that the nursing staff will review with you prior to discharge.  You may have some home medications which will be placed on hold until you complete the course of blood thinner medication.  It is important for you to complete the blood thinner medication as prescribed by your surgeon.  Continue your approved medications as instructed at time of  discharge. ° °PRECAUTIONS °If you experience chest pain or shortness of breath - call 911 immediately for transfer to the hospital emergency department.  °If you develop a fever greater that 101 F, purulent drainage from wound, increased redness or drainage from wound, foul odor from the wound/dressing, or calf pain - CONTACT YOUR SURGEON.   °                                                °FOLLOW-UP APPOINTMENTS °Make sure you keep all of your appointments after your operation with your surgeon and caregivers. You should call the office at the above phone number and make an appointment for approximately two weeks after the date of your surgery or on the date instructed by your surgeon outlined in the "After Visit Summary". ° ° °RANGE OF MOTION AND STRENGTHENING EXERCISES  °Rehabilitation of the knee is important following a knee injury or   an operation. After just a few days of immobilization, the muscles of the thigh which control the knee become weakened and shrink (atrophy). Knee exercises are designed to build up the tone and strength of the thigh muscles and to improve knee motion. Often times heat used for twenty to thirty minutes before working out will loosen up your tissues and help with improving the range of motion but do not use heat for the first two weeks following surgery. These exercises can be done on a training (exercise) mat, on the floor, on a table or on a bed. Use what ever works the best and is most comfortable for you Knee exercises include:  °Leg Lifts - While your knee is still immobilized in a splint or cast, you can do straight leg raises. Lift the leg to 60 degrees, hold for 3 sec, and slowly lower the leg. Repeat 10-20 times 2-3 times daily. Perform this exercise against resistance later as your knee gets better.  °Quad and Hamstring Sets - Tighten up the muscle on the front of the thigh (Quad) and hold for 5-10 sec. Repeat this 10-20 times hourly. Hamstring sets are done by pushing the  foot backward against an object and holding for 5-10 sec. Repeat as with quad sets.  °· Leg Slides: Lying on your back, slowly slide your foot toward your buttocks, bending your knee up off the floor (only go as far as is comfortable). Then slowly slide your foot back down until your leg is flat on the floor again. °· Angel Wings: Lying on your back spread your legs to the side as far apart as you can without causing discomfort.  °A rehabilitation program following serious knee injuries can speed recovery and prevent re-injury in the future due to weakened muscles. Contact your doctor or a physical therapist for more information on knee rehabilitation.  ° °IF YOU ARE TRANSFERRED TO A SKILLED REHAB FACILITY °If the patient is transferred to a skilled rehab facility following release from the hospital, a list of the current medications will be sent to the facility for the patient to continue.  When discharged from the skilled rehab facility, please have the facility set up the patient's Home Health Physical Therapy prior to being released. Also, the skilled facility will be responsible for providing the patient with their medications at time of release from the facility to include their pain medication, the muscle relaxants, and their blood thinner medication. If the patient is still at the rehab facility at time of the two week follow up appointment, the skilled rehab facility will also need to assist the patient in arranging follow up appointment in our office and any transportation needs. ° °MAKE SURE YOU:  °Understand these instructions.  °Get help right away if you are not doing well or get worse.  ° ° °Pick up stool softner and laxative for home use following surgery while on pain medications. °Do not submerge incision under water. °Please use good hand washing techniques while changing dressing each day. °May shower starting three days after surgery. °Please use a clean towel to pat the incision dry following  showers. °Continue to use ice for pain and swelling after surgery. °Do not use any lotions or creams on the incision until instructed by your surgeon. ° °Take Xarelto for two and a half more weeks following discharge from the hospital, then discontinue Xarelto. °Once the patient has completed the blood thinner regimen, then take a Baby 81 mg Aspirin daily for three   more weeks. ° ° ° °Information on my medicine - XARELTO® (Rivaroxaban) ° °This medication education was reviewed with me or my healthcare representative as part of my discharge preparation.  The pharmacist that spoke with me during my hospital stay was:  ° °Why was Xarelto® prescribed for you? °Xarelto® was prescribed for you to reduce the risk of blood clots forming after orthopedic surgery. The medical term for these abnormal blood clots is venous thromboembolism (VTE). ° °What do you need to know about xarelto® ? °Take your Xarelto® ONCE DAILY at the same time every day. °You may take it either with or without food. ° °If you have difficulty swallowing the tablet whole, you may crush it and mix in applesauce just prior to taking your dose. ° °Take Xarelto® exactly as prescribed by your doctor and DO NOT stop taking Xarelto® without talking to the doctor who prescribed the medication.  Stopping without other VTE prevention medication to take the place of Xarelto® may increase your risk of developing a clot. ° °After discharge, you should have regular check-up appointments with your healthcare provider that is prescribing your Xarelto®.   ° °What do you do if you miss a dose? °If you miss a dose, take it as soon as you remember on the same day then continue your regularly scheduled once daily regimen the next day. Do not take two doses of Xarelto® on the same day.  ° °Important Safety Information °A possible side effect of Xarelto® is bleeding. You should call your healthcare provider right away if you experience any of the following: °? Bleeding from an  injury or your nose that does not stop. °? Unusual colored urine (red or dark brown) or unusual colored stools (red or black). °? Unusual bruising for unknown reasons. °? A serious fall or if you hit your head (even if there is no bleeding). ° °Some medicines may interact with Xarelto® and might increase your risk of bleeding while on Xarelto®. To help avoid this, consult your healthcare provider or pharmacist prior to using any new prescription or non-prescription medications, including herbals, vitamins, non-steroidal anti-inflammatory drugs (NSAIDs) and supplements. ° °This website has more information on Xarelto®: www.xarelto.com. ° ° ° °

## 2017-11-08 NOTE — Interval H&P Note (Signed)
History and Physical Interval Note:  11/08/2017 6:30 AM  [name]  has presented today for surgery, with the diagnosis of Osteoarthritis Left Knee  The various methods of treatment have been discussed with the patient and family. After consideration of risks, benefits and other options for treatment, the patient has consented to  Procedure(s): LEFT TOTAL KNEE ARTHROPLASTY (Left) as a surgical intervention .  The patient's history has been reviewed, patient examined, no change in status, stable for surgery.  I have reviewed the patient's chart and labs.  Questions were answered to the patient's satisfaction.     Homero FellersFrank Onyx Edgley

## 2017-11-08 NOTE — Anesthesia Procedure Notes (Signed)
Spinal  Patient location during procedure: OR Start time: 11/08/2017 8:17 AM End time: 11/08/2017 8:19 AM Staffing Anesthesiologist: Effie Berkshire, MD Performed: anesthesiologist  Preanesthetic Checklist Completed: patient identified, site marked, surgical consent, pre-op evaluation, timeout performed, IV checked, risks and benefits discussed and monitors and equipment checked Spinal Block Patient position: sitting Prep: Betadine Patient monitoring: heart rate, continuous pulse ox, blood pressure and cardiac monitor Approach: midline Location: L4-5 Injection technique: single-shot Needle Needle type: Introducer and Pencan  Needle gauge: 24 G Needle length: 9 cm Additional Notes Negative paresthesia. Negative blood return. Positive free-flowing CSF. Expiration date of kit checked and confirmed. Patient tolerated procedure well, without complications.

## 2017-11-08 NOTE — Transfer of Care (Signed)
Immediate Anesthesia Transfer of Care Note  Patient: Jason Ingram  Procedure(s) Performed: LEFT TOTAL KNEE ARTHROPLASTY (Left Knee)  Patient Location: PACU  Anesthesia Type:Spinal  Level of Consciousness: awake, alert  and oriented  Airway & Oxygen Therapy: Patient Spontanous Breathing and Patient connected to face mask oxygen  Post-op Assessment: Report given to RN and Post -op Vital signs reviewed and stable  Post vital signs: Reviewed and stable  Last Vitals:  Vitals:   11/08/17 0756 11/08/17 0800  BP:  135/84  Pulse: (!) 54 (!) 53  Resp: 16 17  Temp:    SpO2: 93% 96%    Last Pain:  Vitals:   11/08/17 0633  TempSrc: Oral      Patients Stated Pain Goal: 3 (11/08/17 0646)  Complications: No apparent anesthesia complications

## 2017-11-08 NOTE — Op Note (Signed)
OPERATIVE REPORT-TOTAL KNEE ARTHROPLASTY   Pre-operative diagnosis- Osteoarthritis  Left knee(s)  Post-operative diagnosis- Osteoarthritis Left knee(s)  Procedure-  Left  Total Knee Arthroplasty  Surgeon- Jason RankinFrank V. Corlis Angelica, MD  Assistant- Avel Peacerew Perkins, PA-C   Anesthesia-  Adductor canal block and spinal  EBL-25 ml   Drains Hemovac  Tourniquet time-  Total Tourniquet Time Documented: Thigh (Left) - 39 minutes Total: Thigh (Left) - 39 minutes     Complications- None  Condition-PACU - hemodynamically stable.   Brief Clinical Note   Jason Ingram is a 61 y.o. year old male with end stage OA of his left knee with progressively worsening pain and dysfunction. He has constant pain, with activity and at rest and significant functional deficits with difficulties even with ADLs. He has had extensive non-op management including analgesics, injections of cortisone and viscosupplements, and home exercise program, but remains in significant pain with significant dysfunction. Radiographs show bone on bone arthritis all 3 compartments. He presents now for left Total Knee Arthroplasty.     Procedure in detail---   The patient is brought into the operating room and positioned supine on the operating table. After successful administration of  Adductor canal block and spinal,   a tourniquet is placed high on the  Left thigh(s) and the lower extremity is prepped and draped in the usual sterile fashion. Time out is performed by the operating team and then the  Left lower extremity is wrapped in Esmarch, knee flexed and the tourniquet inflated to 300 mmHg.       A midline incision is made with a ten blade through the subcutaneous tissue to the level of the extensor mechanism. A fresh blade is used to make a medial parapatellar arthrotomy. Soft tissue over the proximal medial tibia is subperiosteally elevated to the joint line with a knife and into the semimembranosus bursa with a Cobb elevator. Soft  tissue over the proximal lateral tibia is elevated with attention being paid to avoiding the patellar tendon on the tibial tubercle. The patella is everted, knee flexed 90 degrees and the ACL and PCL are removed. Findings are bone on bone all 3 compartments with massive global osteophytes.        The drill is used to create a starting hole in the distal femur and the canal is thoroughly irrigated with sterile saline to remove the fatty contents. The 5 degree Left  valgus alignment guide is placed into the femoral canal and the distal femoral cutting block is pinned to remove 10 mm off the distal femur. Resection is made with an oscillating saw.      The tibia is subluxed forward and the menisci are removed. The extramedullary alignment guide is placed referencing proximally at the medial aspect of the tibial tubercle and distally along the second metatarsal axis and tibial crest. The block is pinned to remove 2mm off the more deficient medial  side. Resection is made with an oscillating saw. Size 8is the most appropriate size for the tibia and the proximal tibia is prepared with the modular drill and keel punch for that size.      The femoral sizing guide is placed and size 7 is most appropriate. Rotation is marked off the epicondylar axis and confirmed by creating a rectangular flexion gap at 90 degrees. The size 7 cutting block is pinned in this rotation and the anterior, posterior and chamfer cuts are made with the oscillating saw. The intercondylar block is then placed and that cut is made.  Trial size 8 tibial component, trial size 7 posterior stabilized femur and a 8  mm posterior stabilized rotating platform insert trial is placed. Full extension is achieved with excellent varus/valgus and anterior/posterior balance throughout full range of motion. The patella is everted and thickness measured to be 27  mm. Free hand resection is taken to 15 mm, a 41 template is placed, lug holes are drilled, trial  patella is placed, and it tracks normally. Osteophytes are removed off the posterior femur with the trial in place. All trials are removed and the cut bone surfaces prepared with pulsatile lavage. Cement is mixed and once ready for implantation, the size 8 tibial implant, size  7 posterior stabilized femoral component, and the size 41 patella are cemented in place and the patella is held with the clamp. The trial insert is placed and the knee held in full extension. The Exparel (20 ml mixed with 60 ml saline) is injected into the extensor mechanism, posterior capsule, medial and lateral gutters and subcutaneous tissues.  All extruded cement is removed and once the cement is hard the permanent 8 mm posterior stabilized rotating platform insert is placed into the tibial tray.      The wound is copiously irrigated with saline solution and the extensor mechanism closed over a hemovac drain with #1 V-loc suture. The tourniquet is released for a total tourniquet time of 38  minutes. Flexion against gravity is 140 degrees and the patella tracks normally. Subcutaneous tissue is closed with 2.0 vicryl and subcuticular with running 4.0 Monocryl. The incision is cleaned and dried and steri-strips and a bulky sterile dressing are applied. The limb is placed into a knee immobilizer and the patient is awakened and transported to recovery in stable condition.      Please note that a surgical assistant was a medical necessity for this procedure in order to perform it in a safe and expeditious manner. Surgical assistant was necessary to retract the ligaments and vital neurovascular structures to prevent injury to them and also necessary for proper positioning of the limb to allow for anatomic placement of the prosthesis.   Jason RankinFrank V. Jason Minkoff, MD    11/08/2017, 9:22 AM

## 2017-11-08 NOTE — Progress Notes (Signed)
Assisted Dr. Hollis with left, ultrasound guided, adductor canal block. Side rails up, monitors on throughout procedure. See vital signs in flow sheet. Tolerated Procedure well.  

## 2017-11-08 NOTE — Anesthesia Procedure Notes (Signed)
Anesthesia Regional Block: Adductor canal block   Pre-Anesthetic Checklist: ,, timeout performed, Correct Patient, Correct Site, Correct Laterality, Correct Procedure, Correct Position, site marked, Risks and benefits discussed,  Surgical consent,  Pre-op evaluation,  At surgeon's request and post-op pain management  Laterality: Left  Prep: chloraprep       Needles:  Injection technique: Single-shot  Needle Type: Echogenic Needle     Needle Length: 9cm  Needle Gauge: 21     Additional Needles:   Procedures:,,,, ultrasound used (permanent image in chart),,,,  Narrative:  Start time: 11/08/2017 7:50 AM End time: 11/08/2017 8:00 AM Injection made incrementally with aspirations every 5 mL.  Performed by: Personally  Anesthesiologist: Shelton SilvasHollis, Kevin D, MD  Additional Notes: Patient tolerated the procedure well. Local anesthetic introduced in an incremental fashion under minimal resistance after negative aspirations. No paresthesias were elicited. After completion of the procedure, no acute issues were identified and patient continued to be monitored by RN.

## 2017-11-08 NOTE — Evaluation (Signed)
Physical Therapy Evaluation Patient Details Name: Jason Ingram MRN: [id number] DOB: [birth year] Today's Date: 11/08/2017   History of Present Illness  61 yo male s/p L TKA 11/08/17  Clinical Impression  On eval POD 0, pt was Min guard assist for mobility. He walked ~150 feet with a RW. Pain rated 6/10. Will follow and progress activity as tolerated.     Follow Up Recommendations DC plan and follow up therapy as arranged by surgeon (OP PT)    Equipment Recommendations  Rolling walker with 5" wheels    Recommendations for Other Services       Precautions / Restrictions Precautions Precautions: Fall Required Braces or Orthoses: Knee Immobilizer - Left Knee Immobilizer - Left: Discontinue once straight leg raise with < 10 degree lag Restrictions Weight Bearing Restrictions: No LLE Weight Bearing: Weight bearing as tolerated      Mobility  Bed Mobility Overal bed mobility: Needs Assistance Bed Mobility: Supine to Sit     Supine to sit: Supervision     General bed mobility comments: for safety, lines.   Transfers Overall transfer level: Needs assistance Equipment used: Rolling walker (2 wheeled) Transfers: Sit to/from Stand Sit to Stand: Min guard;From elevated surface         General transfer comment: close guard for safety. VCs safety, hand/LE placement  Ambulation/Gait Ambulation/Gait assistance: Min guard Ambulation Distance (Feet): 150 Feet Assistive device: Rolling walker (2 wheeled) Gait Pattern/deviations: Step-to pattern;Step-through pattern;Decreased stride length     General Gait Details: close guard for safety. VCs sequence.   Stairs            Wheelchair Mobility    Modified Rankin (Stroke Patients Only)       Balance                                             Pertinent Vitals/Pain Pain Assessment: 0-10 Pain Score: 6  Pain Location: L knee Pain Descriptors / Indicators: Aching;Tightness;Sore Pain  Intervention(s): Monitored during session;Repositioned;Ice applied    Home Living Family/patient expects to be discharged to:: Private residence Living Arrangements: Spouse/significant other Available Help at Discharge: Family Type of Home: House Home Access: Stairs to enter     Home Layout: One level Home Equipment: None      Prior Function Level of Independence: Independent               Hand Dominance        Extremity/Trunk Assessment   Upper Extremity Assessment Upper Extremity Assessment: Overall WFL for tasks assessed    Lower Extremity Assessment Lower Extremity Assessment: LLE deficits/detail LLE Deficits / Details: s/p L TKA    Cervical / Trunk Assessment Cervical / Trunk Assessment: Normal  Communication   Communication: No difficulties  Cognition Arousal/Alertness: Awake/alert Behavior During Therapy: WFL for tasks assessed/performed Overall Cognitive Status: Within Functional Limits for tasks assessed                                        General Comments      Exercises     Assessment/Plan    PT Assessment Patient needs continued PT services  PT Problem List Decreased strength;Decreased range of motion;Decreased mobility;Decreased balance;Decreased activity tolerance;Decreased knowledge of use of DME;Pain;Decreased knowledge of precautions  PT Treatment Interventions DME instruction;Therapeutic activities;Gait training;Therapeutic exercise;Patient/family education;Stair training;Balance training;Functional mobility training    PT Goals (Current goals can be found in the Care Plan section)  Acute Rehab PT Goals Patient Stated Goal: regain PLOF and independence PT Goal Formulation: With patient Time For Goal Achievement: 11/22/17 Potential to Achieve Goals: Good    Frequency 7X/week   Barriers to discharge        Co-evaluation               AM-PAC PT "6 Clicks" Daily Activity  Outcome Measure  Difficulty turning over in bed (including adjusting bedclothes, sheets and blankets)?: A Little Difficulty moving from lying on back to sitting on the side of the bed? : A Little Difficulty sitting down on and standing up from a chair with arms (e.g., wheelchair, bedside commode, etc,.)?: A Little Help needed moving to and from a bed to chair (including a wheelchair)?: A Little Help needed walking in hospital room?: A Little Help needed climbing 3-5 steps with a railing? : A Little 6 Click Score: 18    End of Session Equipment Utilized During Treatment: Gait belt Activity Tolerance: Patient tolerated treatment well Patient left: in chair;with call bell/phone within reach;with chair alarm set   PT Visit Diagnosis: Pain;Muscle weakness (generalized) (M62.81);Difficulty in walking, not elsewhere classified (R26.2) Pain - Right/Left: Left Pain - part of body: Knee    Time: 1555-1610 PT Time Calculation (min) (ACUTE ONLY): 15 min   Charges:   PT Evaluation $PT Eval Low Complexity: 1 Low     PT G Codes:          Rebeca AlertJannie Jurell Basista, MPT Pager: [phone]

## 2017-11-08 NOTE — Anesthesia Postprocedure Evaluation (Signed)
Anesthesia Post Note  Patient: Roxy MannsGeorge Tarlton  Procedure(s) Performed: LEFT TOTAL KNEE ARTHROPLASTY (Left Knee)     Patient location during evaluation: PACU Anesthesia Type: Spinal and Regional Level of consciousness: oriented and awake and alert Pain management: pain level controlled Vital Signs Assessment: post-procedure vital signs reviewed and stable Respiratory status: spontaneous breathing, respiratory function stable and patient connected to nasal cannula oxygen Cardiovascular status: blood pressure returned to baseline and stable Postop Assessment: no headache, no backache, no apparent nausea or vomiting, spinal receding and patient able to bend at knees Anesthetic complications: no    Last Vitals:  Vitals:   11/08/17 1125 11/08/17 1141  BP: 122/79 124/73  Pulse: (!) 46 (!) 50  Resp: 15 14  Temp: (!) 36.4 C (!) 36.3 C  SpO2: 96% 96%    Last Pain:  Vitals:   11/08/17 1125  TempSrc:   PainSc: 0-No pain                 Shelton SilvasKevin D Vy Badley

## 2017-11-08 NOTE — Progress Notes (Signed)
Pt placed on nasal cpap 10 cm with 2l o2 bled in. Pt tolerated it well.

## 2017-11-09 LAB — BASIC METABOLIC PANEL
Anion gap: 5 (ref 5–15)
BUN: 12 mg/dL (ref 6–20)
CO2: 28 mmol/L (ref 22–32)
Calcium: 8.7 mg/dL — ABNORMAL LOW (ref 8.9–10.3)
Chloride: 104 mmol/L (ref 101–111)
Creatinine, Ser: 0.75 mg/dL (ref 0.61–1.24)
GFR calc non Af Amer: >60 mL/min (ref >60)
Glucose, Bld: 120 mg/dL — ABNORMAL HIGH (ref 65–99)
POTASSIUM: 3.5 mmol/L (ref 3.5–5.1)
SODIUM: 137 mmol/L (ref 135–145)

## 2017-11-09 LAB — CBC
HEMATOCRIT: 36.9 % — AB (ref 39.0–52.0)
HEMOGLOBIN: 12.2 g/dL — AB (ref 13.0–17.0)
MCH: 29.3 pg (ref 26.0–34.0)
MCHC: 33.1 g/dL (ref 30.0–36.0)
MCV: 88.5 fL (ref 78.0–100.0)
Platelets: 206 10*3/uL (ref 150–400)
RBC: 4.17 MIL/uL — AB (ref 4.22–5.81)
RDW: 13.8 % (ref 11.5–15.5)
WBC: 13.2 10*3/uL — ABNORMAL HIGH (ref 4.0–10.5)

## 2017-11-09 MED ORDER — GABAPENTIN 300 MG PO CAPS
300.0000 mg | ORAL_CAPSULE | Freq: Three times a day (TID) | ORAL | 0 refills | Status: DC
Start: 2017-11-09 — End: 2021-02-18

## 2017-11-09 MED ORDER — RIVAROXABAN 10 MG PO TABS: 10.0000 mg | ORAL_TABLET | Freq: Every day | ORAL | 0 refills | Status: DC

## 2017-11-09 MED ORDER — OXYCODONE HCL 5 MG PO TABS: 5.0000 mg | ORAL_TABLET | ORAL | 0 refills | Status: DC | PRN

## 2017-11-09 MED ORDER — METHOCARBAMOL 500 MG PO TABS: 500.0000 mg | ORAL_TABLET | Freq: Four times a day (QID) | ORAL | 0 refills | Status: DC | PRN

## 2017-11-09 NOTE — Progress Notes (Signed)
   Subjective: 1 Day Post-Op Procedure(s) (LRB): LEFT TOTAL KNEE ARTHROPLASTY (Left) Patient reports pain as mild.   Patient seen in rounds for Dr. Lequita HaltAluisio. Patient is well, but has had some minor complaints of pain in the knee, requiring pain medications We will resume therapy today.  Plan is to go Home after hospital stay.  Objective: Vital signs in last 24 hours: Temp:  [97.2 F (36.2 C)-98.4 F (36.9 C)] 97.6 F (36.4 C) (12/04 1057) Pulse Rate:  [55-62] 62 (12/04 1057) Resp:  [15-18] 16 (12/04 1057) BP: (120-175)/(62-88) 170/82 (12/04 1057) SpO2:  [94 %-98 %] 98 % (12/04 0613)  Intake/Output from previous day:  Intake/Output Summary (Last 24 hours) at 11/09/2017 1159 Last data filed at 11/09/2017 0829 Gross per 24 hour  Intake 2635 ml  Output 4085 ml  Net -1450 ml    Intake/Output this shift: Total I/O In: 240 [P.O.:240] Out: 250 [Urine:250]  Labs: Recent Labs    11/09/17 0538  HGB 12.2*   Recent Labs    11/09/17 0538  WBC 13.2*  RBC 4.17*  HCT 36.9*  PLT 206   Recent Labs    11/09/17 0538  NA 137  K 3.5  CL 104  CO2 28  BUN 12  CREATININE 0.75  GLUCOSE 120*  CALCIUM 8.7*   No results for input(s): LABPT, INR in the last 72 hours.  EXAM General - Patient is Alert, Appropriate and Oriented Extremity - Neurovascular intact Sensation intact distally Intact pulses distally Dorsiflexion/Plantar flexion intact Dressing - dressing C/D/I Motor Function - intact, moving foot and toes well on exam.  Hemovac pulled without difficulty.  Past Medical History:  Diagnosis Date  . Depression   . High cholesterol   . History of broken nose   . History of pneumonia 1998  . Hx of colonoscopy 2013  . Hypertension   . Injury of pelvis 1997  . Sleep apnea     Assessment/Plan: 1 Day Post-Op Procedure(s) (LRB): LEFT TOTAL KNEE ARTHROPLASTY (Left) Principal Problem:   OA (osteoarthritis) of knee  Estimated body mass index is 37.23 kg/m as  calculated from the following:   Height as of this encounter: 6\' 5"  (1.956 m).   Weight as of this encounter: 142.4 kg (314 lb). Advance diet Up with therapy Plan for discharge tomorrow Discharge home - straight to outpatient  DVT Prophylaxis - Xarelto Weight-Bearing as tolerated to left leg D/C O2 and Pulse OX and try on Room Air  Avel Peacerew Broady Lafoy, PA-C Orthopaedic Surgery 11/09/2017, 11:59 AM

## 2017-11-09 NOTE — Discharge Summary (Signed)
Physician Discharge Summary   Patient ID: Jason Ingram MRN: [id number] DOB/AGE: 12/12/1955 61 y.o.  Admit date: 11/08/2017 Discharge date: 11-10-2017  Primary Diagnosis:  Osteoarthritis Left knee(s)   Admission Diagnoses:  Past Medical History:  Diagnosis Date  . Depression   . High cholesterol   . History of broken nose   . History of pneumonia 1998  . Hx of colonoscopy 2013  . Hypertension   . Injury of pelvis 1997  . Sleep apnea    Discharge Diagnoses:   Principal Problem:   OA (osteoarthritis) of knee  Estimated body mass index is 37.23 kg/m as calculated from the following:   Height as of this encounter: 6' 5"  (1.956 m).   Weight as of this encounter: 142.4 kg (314 lb).  Procedure:  Procedure(s) (LRB): LEFT TOTAL KNEE ARTHROPLASTY (Left)   Consults: None  HPI: Jason Ingram is a 61 y.o. year old male with end stage OA of his left knee with progressively worsening pain and dysfunction. He has constant pain, with activity and at rest and significant functional deficits with difficulties even with ADLs. He has had extensive non-op management including analgesics, injections of cortisone and viscosupplements, and home exercise program, but remains in significant pain with significant dysfunction. Radiographs show bone on bone arthritis all 3 compartments. He presents now for left Total Knee Arthroplasty.    Laboratory Data: Admission on 11/08/2017  Component Date Value Ref Range Status  . WBC 11/09/2017 13.2* 4.0 - 10.5 K/uL Final  . RBC 11/09/2017 4.17* 4.22 - 5.81 MIL/uL Final  . Hemoglobin 11/09/2017 12.2* 13.0 - 17.0 g/dL Final  . HCT 11/09/2017 36.9* 39.0 - 52.0 % Final  . MCV 11/09/2017 88.5  78.0 - 100.0 fL Final  . MCH 11/09/2017 29.3  26.0 - 34.0 pg Final  . MCHC 11/09/2017 33.1  30.0 - 36.0 g/dL Final  . RDW 11/09/2017 13.8  11.5 - 15.5 % Final  . Platelets 11/09/2017 206  150 - 400 K/uL Final  . Sodium 11/09/2017 137  135 - 145 mmol/L Final  . Potassium  11/09/2017 3.5  3.5 - 5.1 mmol/L Final  . Chloride 11/09/2017 104  101 - 111 mmol/L Final  . CO2 11/09/2017 28  22 - 32 mmol/L Final  . Glucose, Bld 11/09/2017 120* 65 - 99 mg/dL Final  . BUN 11/09/2017 12  6 - 20 mg/dL Final  . Creatinine, Ser 11/09/2017 0.75  0.61 - 1.24 mg/dL Final  . Calcium 11/09/2017 8.7* 8.9 - 10.3 mg/dL Final  . GFR calc non Af Amer 11/09/2017 >60  >60 mL/min Final  . GFR calc Af Amer 11/09/2017 >60  >60 mL/min Final   Comment: (NOTE) The eGFR has been calculated using the CKD EPI equation. This calculation has not been validated in all clinical situations. eGFR's persistently <60 mL/min signify possible Chronic Kidney Disease.   [name] gap 11/09/2017 5  5 - 15 Final  Hospital Outpatient Visit on 11/03/2017  Component Date Value Ref Range Status  . aPTT 11/03/2017 27  24 - 36 seconds Final  . WBC 11/03/2017 7.0  4.0 - 10.5 K/uL Final  . RBC 11/03/2017 4.87  4.22 - 5.81 MIL/uL Final  . Hemoglobin 11/03/2017 14.4  13.0 - 17.0 g/dL Final  . HCT 11/03/2017 42.9  39.0 - 52.0 % Final  . MCV 11/03/2017 88.1  78.0 - 100.0 fL Final  . MCH 11/03/2017 29.6  26.0 - 34.0 pg Final  . MCHC 11/03/2017 33.6  30.0 - 36.0 g/dL  Final  . RDW 11/03/2017 13.7  11.5 - 15.5 % Final  . Platelets 11/03/2017 229  150 - 400 K/uL Final  . Sodium 11/03/2017 138  135 - 145 mmol/L Final  . Potassium 11/03/2017 3.6  3.5 - 5.1 mmol/L Final  . Chloride 11/03/2017 101  101 - 111 mmol/L Final  . CO2 11/03/2017 29  22 - 32 mmol/L Final  . Glucose, Bld 11/03/2017 114* 65 - 99 mg/dL Final  . BUN 11/03/2017 15  6 - 20 mg/dL Final  . Creatinine, Ser 11/03/2017 0.95  0.61 - 1.24 mg/dL Final  . Calcium 11/03/2017 9.5  8.9 - 10.3 mg/dL Final  . Total Protein 11/03/2017 7.0  6.5 - 8.1 g/dL Final  . Albumin 11/03/2017 4.0  3.5 - 5.0 g/dL Final  . AST 11/03/2017 28  15 - 41 U/L Final  . ALT 11/03/2017 34  17 - 63 U/L Final  . Alkaline Phosphatase 11/03/2017 64  38 - 126 U/L Final  . Total Bilirubin  11/03/2017 0.6  0.3 - 1.2 mg/dL Final  . GFR calc non Af Amer 11/03/2017 >60  >60 mL/min Final  . GFR calc Af Amer 11/03/2017 >60  >60 mL/min Final   Comment: (NOTE) The eGFR has been calculated using the CKD EPI equation. This calculation has not been validated in all clinical situations. eGFR's persistently <60 mL/min signify possible Chronic Kidney Disease.   . Anion gap 11/03/2017 8  5 - 15 Final  . Prothrombin Time 11/03/2017 12.5  11.4 - 15.2 seconds Final  . INR 11/03/2017 0.94   Final  . ABO/RH(D) 11/03/2017 A POS   Final  . Antibody Screen 11/03/2017 NEG   Final  . Sample Expiration 11/03/2017 11/11/2017   Final  . Extend sample reason 11/03/2017 NO TRANSFUSIONS OR PREGNANCY IN THE PAST 3 MONTHS   Final  . MRSA, PCR 11/03/2017 NEGATIVE  NEGATIVE Final  . Staphylococcus aureus 11/03/2017 NEGATIVE  NEGATIVE Final   Comment: (NOTE) The Xpert SA Assay (FDA approved for NASAL specimens in patients 32 years of age and older), is one component of a comprehensive surveillance program. It is not intended to diagnose infection nor to guide or monitor treatment.   . ABO/RH(D) 11/03/2017 A POS   Final  Office Visit on 09/29/2017  Component Date Value Ref Range Status  . Glucose, Bld 09/29/2017 86  65 - 139 mg/dL Final   Comment: .        Non-fasting reference interval .   . BUN 09/29/2017 14  7 - 25 mg/dL Final  . Creat 09/29/2017 1.07  0.70 - 1.25 mg/dL Final   Comment: For patients >74 years of age, the reference limit for Creatinine is approximately 13% higher for people identified as African-American. .   . GFR, Est Non African American 09/29/2017 74  > OR = 60 mL/min/1.29m Final  . GFR, Est African American 09/29/2017 86  > OR = 60 mL/min/1.763mFinal  . BUN/Creatinine Ratio 1001/77/9390OT APPLICABLE  6 - 22 (calc) Final  . Sodium 09/29/2017 141  135 - 146 mmol/L Final  . Potassium 09/29/2017 3.6  3.5 - 5.3 mmol/L Final  . Chloride 09/29/2017 103  98 - 110 mmol/L Final    . CO2 09/29/2017 33* 20 - 32 mmol/L Final  . Calcium 09/29/2017 9.2  8.6 - 10.3 mg/dL Final  . Total Protein 09/29/2017 6.7  6.1 - 8.1 g/dL Final  . Albumin 09/29/2017 4.0  3.6 - 5.1 g/dL Final  . Globulin  09/29/2017 2.7  1.9 - 3.7 g/dL (calc) Final  . AG Ratio 09/29/2017 1.5  1.0 - 2.5 (calc) Final  . Total Bilirubin 09/29/2017 0.4  0.2 - 1.2 mg/dL Final  . Alkaline phosphatase (APISO) 09/29/2017 56  40 - 115 U/L Final  . AST 09/29/2017 15  10 - 35 U/L Final  . ALT 09/29/2017 15  9 - 46 U/L Final  . Cholesterol 09/29/2017 143  <200 mg/dL Final  . HDL 09/29/2017 70  >40 mg/dL Final  . Triglycerides 09/29/2017 94  <150 mg/dL Final  . LDL Cholesterol (Calc) 09/29/2017 55  mg/dL (calc) Final   Comment: Reference range: <100 . Desirable range <100 mg/dL for primary prevention;   <70 mg/dL for patients with CHD or diabetic patients  with > or = 2 CHD risk factors. Marland Kitchen LDL-C is now calculated using the Martin-Hopkins  calculation, which is a validated novel method providing  better accuracy than the Friedewald equation in the  estimation of LDL-C.  Cresenciano Genre et al. Annamaria Helling. 5277;824(23): 2061-2068  (http://education.[facility].com/faq/FAQ164)   . Total CHOL/HDL Ratio 09/29/2017 2.0  <5.0 (calc) Final  . Non-HDL Cholesterol (Calc) 09/29/2017 73  <130 mg/dL (calc) Final   Comment: For patients with diabetes plus 1 major ASCVD risk  factor, treating to a non-HDL-C goal of <100 mg/dL  (LDL-C of <70 mg/dL) is considered a therapeutic  option.   . WBC 09/29/2017 6.3  3.8 - 10.8 Thousand/uL Final  . RBC 09/29/2017 4.90  4.20 - 5.80 Million/uL Final  . Hemoglobin 09/29/2017 14.2  13.2 - 17.1 g/dL Final  . HCT 09/29/2017 41.5  38.5 - 50.0 % Final  . MCV 09/29/2017 84.7  80.0 - 100.0 fL Final  . MCH 09/29/2017 29.0  27.0 - 33.0 pg Final  . MCHC 09/29/2017 34.2  32.0 - 36.0 g/dL Final  . RDW 09/29/2017 13.0  11.0 - 15.0 % Final  . Platelets 09/29/2017 229  140 - 400 Thousand/uL Final  .  MPV 09/29/2017 10.1  7.5 - 12.5 fL Final  . Neutro Abs 09/29/2017 4,561  1,500 - 7,800 cells/uL Final  . Lymphs Abs 09/29/2017 1,153  850 - 3,900 cells/uL Final  . WBC mixed population 09/29/2017 491  200 - 950 cells/uL Final  . Eosinophils Absolute 09/29/2017 63  15 - 500 cells/uL Final  . Basophils Absolute 09/29/2017 32  0 - 200 cells/uL Final  . Neutrophils Relative % 09/29/2017 72.4  % Final  . Total Lymphocyte 09/29/2017 18.3  % Final  . Monocytes Relative 09/29/2017 7.8  % Final  . Eosinophils Relative 09/29/2017 1.0  % Final  . Basophils Relative 09/29/2017 0.5  % Final  . TSH 09/29/2017 1.06  0.40 - 4.50 mIU/L Final  . Hepatitis C Ab 09/29/2017 NON-REACTIVE  NON-REACTI Final  . SIGNAL TO CUT-OFF 09/29/2017 0.02  <1.00 Final  . HIV 1&2 Ab, 4th Generation 09/29/2017 NON-REACTIVE  NON-REACTI Final   Comment: HIV-1 antigen and HIV-1/HIV-2 antibodies were not detected. There is no laboratory evidence of HIV infection. Marland Kitchen PLEASE NOTE: This information has been disclosed to you from records whose confidentiality may be protected by state law.  If your state requires such protection, then the state law prohibits you from making any further disclosure of the information without the specific written consent of the person to whom it pertains, or as otherwise permitted by law. A general authorization for the release of medical or other information is NOT sufficient for this purpose. . For additional information please refer  to http://education.[facility].com/faq/FAQ106 (This link is being provided for informational/ educational purposes only.) . Marland Kitchen The performance of this assay has not been clinically validated in patients less than 75 years old. .      X-Rays:No results found.  EKG: Orders placed or performed in visit on 09/29/17  . EKG 12-Lead     Hospital Course: Jason Ingram is a 61 y.o. who was admitted to [facility]. They were brought to the operating  room on 11/08/2017 and underwent Procedure(s): LEFT TOTAL KNEE ARTHROPLASTY.  Patient tolerated the procedure well and was later transferred to the recovery room and then to the orthopaedic floor for postoperative care.  They were given PO and IV analgesics for pain control following their surgery.  They were given 24 hours of postoperative antibiotics of  Anti-infectives (From admission, onward)   Start     Dose/Rate Route Frequency Ordered Stop   11/08/17 1430  ceFAZolin (ANCEF) IVPB 2g/100 mL premix     2 g 200 mL/hr over 30 Minutes Intravenous Every 6 hours 11/08/17 1146 11/08/17 2040   11/08/17 0600  ceFAZolin (ANCEF) 3 g in dextrose 5 % 50 mL IVPB     3 g 130 mL/hr over 30 Minutes Intravenous On call to O.R. 11/07/17 1354 11/08/17 0815     and started on DVT prophylaxis in the form of Xarelto.   PT and OT were ordered for total joint protocol.  Discharge planning consulted to help with postop disposition and equipment needs.  Patient had a tough night on the evening of surgery.  They started to get up OOB with therapy on day one. Hemovac drain was pulled without difficulty.  Continued to work with therapy into day two.  Dressing was changed on day two and the incision was healing well.  Patient was seen in rounds on day two and after therapy goals, he was ready to go home.  Diet - Cardiac diet Follow up - in 2 weeks Activity - WBAT Disposition - Home Condition Upon Discharge - Good D/C Meds - See DC Summary DVT Prophylaxis - Xarelto    Discharge Instructions    Call MD / Call 911   Complete by:  As directed    If you experience chest pain or shortness of breath, CALL 911 and be transported to the hospital emergency room.  If you develope a fever above 101 F, pus (white drainage) or increased drainage or redness at the wound, or calf pain, call your surgeon's office.   Change dressing   Complete by:  As directed    Change dressing daily with sterile 4 x 4 inch gauze dressing and  apply TED hose. Do not submerge the incision under water.   Constipation Prevention   Complete by:  As directed    Drink plenty of fluids.  Prune juice may be helpful.  You may use a stool softener, such as Colace (over the counter) 100 mg twice a day.  Use MiraLax (over the counter) for constipation as needed.   Diet - low sodium heart healthy   Complete by:  As directed    Discharge instructions   Complete by:  As directed    Take Xarelto for two and a half more weeks, then discontinue Xarelto. Once the patient has completed the blood thinner regimen, then take a Baby 81 mg Aspirin daily for three more weeks.   Pick up stool softner and laxative for home use following surgery while on pain medications. Do not submerge  incision under water. Please use good hand washing techniques while changing dressing each day. May shower starting three days after surgery. Please use a clean towel to pat the incision dry following showers. Continue to use ice for pain and swelling after surgery. Do not use any lotions or creams on the incision until instructed by your surgeon.  Wear both TED hose on both legs during the day every day for three weeks, but may remove the TED hose at night at home.  Postoperative Constipation Protocol  Constipation - defined medically as fewer than three stools per week and severe constipation as less than one stool per week.  One of the most common issues patients have following surgery is constipation.  Even if you have a regular bowel pattern at home, your normal regimen is likely to be disrupted due to multiple reasons following surgery.  Combination of anesthesia, postoperative narcotics, change in appetite and fluid intake all can affect your bowels.  In order to avoid complications following surgery, here are some recommendations in order to help you during your recovery period.  Colace (docusate) - Pick up an over-the-counter form of Colace or another stool  softener and take twice a day as long as you are requiring postoperative pain medications.  Take with a full glass of water daily.  If you experience loose stools or diarrhea, hold the colace until you stool forms back up.  If your symptoms do not get better within 1 week or if they get worse, check with your doctor.  Dulcolax (bisacodyl) - Pick up over-the-counter and take as directed by the product packaging as needed to assist with the movement of your bowels.  Take with a full glass of water.  Use this product as needed if not relieved by Colace only.   MiraLax (polyethylene glycol) - Pick up over-the-counter to have on hand.  MiraLax is a solution that will increase the amount of water in your bowels to assist with bowel movements.  Take as directed and can mix with a glass of water, juice, soda, coffee, or tea.  Take if you go more than two days without a movement. Do not use MiraLax more than once per day. Call your doctor if you are still constipated or irregular after using this medication for 7 days in a row.  If you continue to have problems with postoperative constipation, please contact the office for further assistance and recommendations.  If you experience "the worst abdominal pain ever" or develop nausea or vomiting, please contact the office immediatly for further recommendations for treatment.   Do not put a pillow under the knee. Place it under the heel.   Complete by:  As directed    Do not sit on low chairs, stoools or toilet seats, as it may be difficult to get up from low surfaces   Complete by:  As directed    Driving restrictions   Complete by:  As directed    No driving until released by the physician.   Increase activity slowly as tolerated   Complete by:  As directed    Lifting restrictions   Complete by:  As directed    No lifting until released by the physician.   Patient may shower   Complete by:  As directed    You may shower without a dressing once there is no  drainage.  Do not wash over the wound.  If drainage remains, do not shower until drainage stops.   TED hose  Complete by:  As directed    Use stockings (TED hose) for 3 weeks on both leg(s).  You may remove them at night for sleeping.   Weight bearing as tolerated   Complete by:  As directed    Laterality:  left   Extremity:  Lower     Allergies as of 11/09/2017   No Known Allergies     Medication List    STOP taking these medications   CENTRUM SILVER ADULT 50+ Tabs   DUEXIS 800-26.6 MG Tabs Generic drug:  Ibuprofen-Famotidine     TAKE these medications   baclofen 10 MG tablet Commonly known as:  LIORESAL Take 10 mg 3 (three) times daily as needed by mouth for muscle spasms.   cloNIDine 0.3 MG tablet Commonly known as:  CATAPRES TAKE 1 TABLET(0.3 MG) BY MOUTH DAILY   finasteride 5 MG tablet Commonly known as:  PROSCAR Take 1 tablet (5 mg total) by mouth daily.   gabapentin 300 MG capsule Commonly known as:  NEURONTIN Take 1 capsule (300 mg total) by mouth 3 (three) times daily. Gabapentin 300 mg Protocol Take a 300 mg capsule three times a day for one week, Then a 300 mg capsule twice a day for one week, Then a 300 mg capsule once a day for one week, then discontinue the Gabapentin.   methocarbamol 500 MG tablet Commonly known as:  ROBAXIN Take 1 tablet (500 mg total) by mouth every 6 (six) hours as needed for muscle spasms.   oxyCODONE 5 MG immediate release tablet Commonly known as:  Oxy IR/ROXICODONE Take 1-2 tablets (5-10 mg total) by mouth every 4 (four) hours as needed for moderate pain or severe pain.   PARoxetine 20 MG tablet Commonly known as:  PAXIL TAKE 1 TABLET(20 MG) BY MOUTH DAILY   rivaroxaban 10 MG Tabs tablet Commonly known as:  XARELTO Take 1 tablet (10 mg total) by mouth daily with breakfast. Take Xarelto for two and a half more weeks following discharge from the hospital, then discontinue Xarelto. Once the patient has completed the blood  thinner regimen, then take a Baby 81 mg Aspirin daily for three more weeks. Start taking on:  11/10/2017   simvastatin 40 MG tablet Commonly known as:  ZOCOR TAKE 1 TABLET(40 MG) BY MOUTH AT BEDTIME FOR CHOLESTEROL   tamsulosin 0.4 MG Caps capsule Commonly known as:  FLOMAX Take 0.4 mg daily after breakfast by mouth.   triamterene-hydrochlorothiazide 37.5-25 MG capsule Commonly known as:  DYAZIDE TAKE 1 CAPSULE BY MOUTH DAILY            Durable Medical Equipment  (From admission, onward)        Start     Ordered   11/09/17 1137  For home use only DME Walker rolling  Once    Question:  Patient needs a walker to treat with the following condition  Answer:  S/P knee surgery   11/09/17 1136   11/09/17 1137  For home use only DME 3 n 1  Once     11/09/17 1136       Discharge Care Instructions  (From admission, onward)        Start     Ordered   11/09/17 0000  Weight bearing as tolerated    Question Answer Comment  Laterality left   Extremity Lower      11/09/17 1821   11/09/17 0000  Change dressing    Comments:  Change dressing daily with sterile 4 x 4  inch gauze dressing and apply TED hose. Do not submerge the incision under water.   11/09/17 [facility] Follow up.   Why:  walker and 3n1 Contact information: [address] [place] [address] [phone]        Gaynelle Arabian, MD. Schedule an appointment as soon as possible for a visit on 11/23/2017.   Specialty:  Orthopedic Surgery Contact information: [address] [place] [address] [phone]           Signed: Arlee Muslim, PA-C Orthopaedic Surgery 11/09/2017, 6:22 PM

## 2017-11-09 NOTE — Progress Notes (Signed)
Physical Therapy Treatment Patient Details Name: Jason MannsGeorge Ingram MRN: [id number] DOB: December 22, 1955 Today's Date: 11/09/2017    History of Present Illness 61 yo male s/p L TKA 11/08/17    PT Comments    Progressing with mobility. Pain rated 7/10 during activity.    Follow Up Recommendations  DC plan and follow up therapy as arranged by surgeon(OP PT)     Equipment Recommendations  Rolling walker with 5" wheels    Recommendations for Other Services       Precautions / Restrictions Precautions Precautions: Fall Required Braces or Orthoses: Knee Immobilizer - Left Knee Immobilizer - Left: Discontinue once straight leg raise with < 10 degree lag Restrictions Weight Bearing Restrictions: No LLE Weight Bearing: Weight bearing as tolerated    Mobility  Bed Mobility               General bed mobility comments: oob in recliner  Transfers Overall transfer level: Needs assistance Equipment used: Rolling walker (2 wheeled) Transfers: Sit to/from Stand Sit to Stand: Min guard         General transfer comment: close guard for safety. VCs safety, hand/LE placement  Ambulation/Gait Ambulation/Gait assistance: Min guard Ambulation Distance (Feet): 150 Feet Assistive device: Rolling walker (2 wheeled) Gait Pattern/deviations: Step-to pattern;Step-through pattern;Decreased stride length     General Gait Details: close guard for safety. VCs sequence.    Stairs            Wheelchair Mobility    Modified Rankin (Stroke Patients Only)       Balance                                            Cognition Arousal/Alertness: Awake/alert Behavior During Therapy: WFL for tasks assessed/performed Overall Cognitive Status: Within Functional Limits for tasks assessed                                        Exercises Total Joint Exercises Ankle Circles/Pumps: AROM;Both;10 reps;Supine Quad Sets: AROM;Both;10 reps;Supine Short Arc Quad:  AAROM;Left;10 reps;Seated Straight Leg Raises: AROM;Left;10 reps;Supine Knee Flexion: AAROM;Left;10 reps;Seated Goniometric ROM: ~10-60 degrees    General Comments        Pertinent Vitals/Pain Pain Assessment: 0-10 Pain Score: 7  Pain Location: L knee Pain Descriptors / Indicators: Aching;Tightness;Sore Pain Intervention(s): Monitored during session;Repositioned;Ice applied    Home Living                      Prior Function            PT Goals (current goals can now be found in the care plan section) Progress towards PT goals: Progressing toward goals    Frequency    7X/week      PT Plan Current plan remains appropriate    Co-evaluation              AM-PAC PT "6 Clicks" Daily Activity  Outcome Measure  Difficulty turning over in bed (including adjusting bedclothes, sheets and blankets)?: A Little Difficulty moving from lying on back to sitting on the side of the bed? : A Little Difficulty sitting down on and standing up from a chair with arms (e.g., wheelchair, bedside commode, etc,.)?: A Little Help needed moving to and from a bed to chair (including a  wheelchair)?: A Little Help needed walking in hospital room?: A Little Help needed climbing 3-5 steps with a railing? : A Lot 6 Click Score: 17    End of Session Equipment Utilized During Treatment: Gait belt Activity Tolerance: Patient tolerated treatment well Patient left: in chair;with call bell/phone within reach   PT Visit Diagnosis: Pain;Muscle weakness (generalized) (M62.81);Difficulty in walking, not elsewhere classified (R26.2) Pain - Right/Left: Left Pain - part of body: Knee     Time: [id number] PT Time Calculation (min) (ACUTE ONLY): 20 min  Charges:  $Gait Training: 8-22 mins                    G Codes:          Jason Ingram, MPT Pager: [phone]

## 2017-11-09 NOTE — Progress Notes (Signed)
Physical Therapy Treatment Patient Details Name: Jason Ingram MRN: [id number] DOB: 05-Mar-1956 Today's Date: 11/09/2017    History of Present Illness 61 yo male s/p L TKA 11/08/17    PT Comments    Progressing with mobility. Plan is for d/c on tomorrow.    Follow Up Recommendations  DC plan and follow up therapy as arranged by surgeon(Op pt)     Equipment Recommendations  Rolling walker with 5" wheels    Recommendations for Other Services       Precautions / Restrictions Precautions Precautions: Fall Precaution Comments: reviewed no pillow under knee Required Braces or Orthoses: Knee Immobilizer - Left Knee Immobilizer - Left: Discontinue once straight leg raise with < 10 degree lag Restrictions Weight Bearing Restrictions: No LLE Weight Bearing: Weight bearing as tolerated    Mobility  Bed Mobility Overal bed mobility: Needs Assistance Bed Mobility: Sit to Supine      Sit to supine: Supervision   General bed mobility comments: Pt hooked L LE with R foot.  Transfers Overall transfer level: Needs assistance Equipment used: Rolling walker (2 wheeled) Transfers: Sit to/from Stand Sit to Stand: Min guard         General transfer comment: close guard for safety. VCs safety, hand/LE placement  Ambulation/Gait Ambulation/Gait assistance: Min guard Ambulation Distance (Feet): 150 Feet Assistive device: Rolling walker (2 wheeled) Gait Pattern/deviations: Step-to pattern;Step-through pattern;Decreased stride length     General Gait Details: close guard for safety.    Stairs            Wheelchair Mobility    Modified Rankin (Stroke Patients Only)       Balance Overall balance assessment: Needs assistance Sitting-balance support: No upper extremity supported Sitting balance-Leahy Scale: Good     Standing balance support: Single extremity supported;Bilateral upper extremity supported;During functional activity Standing balance-Leahy Scale: Fair                               Cognition Arousal/Alertness: Awake/alert Behavior During Therapy: WFL for tasks assessed/performed Overall Cognitive Status: Within Functional Limits for tasks assessed                                        Exercises      General Comments        Pertinent Vitals/Pain Pain Assessment: 0-10 Pain Score: 5  Pain Location: L knee Pain Descriptors / Indicators: Aching;Tightness;Sore Pain Intervention(s): Monitored during session;Repositioned;Ice applied    Home Living Family/patient expects to be discharged to:: Private residence Living Arrangements: Spouse/significant other Available Help at Discharge: Family Type of Home: House Home Access: Stairs to enter   Home Layout: One level Home Equipment: None      Prior Function Level of Independence: Independent          PT Goals (current goals can now be found in the care plan section) Acute Rehab PT Goals Patient Stated Goal: regain PLOF and independence Progress towards PT goals: Progressing toward goals    Frequency    7X/week      PT Plan Current plan remains appropriate    Co-evaluation              AM-PAC PT "6 Clicks" Daily Activity  Outcome Measure  Difficulty turning over in bed (including adjusting bedclothes, sheets and blankets)?: A Little Difficulty moving from lying on back  to sitting on the side of the bed? : A Little Difficulty sitting down on and standing up from a chair with arms (e.g., wheelchair, bedside commode, etc,.)?: A Little Help needed moving to and from a bed to chair (including a wheelchair)?: A Little Help needed walking in hospital room?: A Little Help needed climbing 3-5 steps with a railing? : A Little 6 Click Score: 18    End of Session Equipment Utilized During Treatment: Gait belt Activity Tolerance: Patient tolerated treatment well Patient left: in bed;with call bell/phone within reach   PT Visit Diagnosis:  Muscle weakness (generalized) (M62.81);Difficulty in walking, not elsewhere classified (R26.2) Pain - Right/Left: Left Pain - part of body: Knee     Time: 1610-96041508-1524 PT Time Calculation (min) (ACUTE ONLY): 16 min  Charges:  $Gait Training: 8-22 mins                    G Codes:          Rebeca AlertJannie Sharnetta Gielow, MPT Pager: [phone]

## 2017-11-09 NOTE — Progress Notes (Signed)
Spoke with patient at bedside. Confirmed plan for OP PT, already arranged. Needs RW and 3n1, contacted [facility] to deliver to room. [phone]

## 2017-11-09 NOTE — Evaluation (Signed)
Occupational Therapy Evaluation Patient Details Name: Jason Ingram MRN: [id number] DOB: [birth year] Today's Date: 11/09/2017    History of Present Illness 61 yo male s/p L TKA 11/08/17   Clinical Impression   Pt with decline in function and safety with ADLs and ADL mobility wit decreased balance and endurance. Pt would benefit from acute OT services to address impairments to maximize level of function and safety to return home    Follow Up Recommendations  DC plan and follow up therapy as arranged by surgeon;Supervision - Intermittent    Equipment Recommendations  3 in 1 bedside commode;Tub/shower bench;Other (comment)(A/E kit)    Recommendations for Other Services       Precautions / Restrictions Precautions Precautions: Fall Precaution Comments: reviewed no pillow under knee Required Braces or Orthoses: Knee Immobilizer - Left Knee Immobilizer - Left: Discontinue once straight leg raise with < 10 degree lag Restrictions Weight Bearing Restrictions: No LLE Weight Bearing: Weight bearing as tolerated      Mobility Bed Mobility Overal bed mobility: Needs Assistance Bed Mobility: Supine to Sit     Supine to sit: Supervision     General bed mobility comments: oob in recliner  Transfers Overall transfer level: Needs assistance Equipment used: Rolling walker (2 wheeled) Transfers: Sit to/from Stand Sit to Stand: Min guard         General transfer comment: close guard for safety. VCs safety, hand/LE placement    Balance Overall balance assessment: Needs assistance Sitting-balance support: No upper extremity supported Sitting balance-Leahy Scale: Good     Standing balance support: Single extremity supported;Bilateral upper extremity supported;During functional activity Standing balance-Leahy Scale: Fair                             ADL either performed or assessed with clinical judgement   ADL Overall ADL's : Needs  assistance/impaired Eating/Feeding: Independent;Sitting   Grooming: Wash/dry hands;Wash/dry face;Standing;Min guard   Upper Body Bathing: Set up;Sitting       Upper Body Dressing : Set up;Sitting   Lower Body Dressing: Minimal assistance   Toilet Transfer: Min guard;RW;Ambulation;Comfort height toilet;Cueing for safety   Toileting- Clothing Manipulation and Hygiene: Minimal assistance;Sit to/from stand       Functional mobility during ADLs: Min guard;Rolling walker;Cueing for safety General ADL Comments: pt educated on ADL A/E and tib bench for home use, handouts provided     Vision Baseline Vision/History: Wears glasses Wears Glasses: Reading only Patient Visual Report: No change from baseline       Perception     Praxis      Pertinent Vitals/Pain Pain Assessment: 0-10 Pain Score: 5  Pain Location: L knee Pain Descriptors / Indicators: Aching;Tightness;Sore Pain Intervention(s): Monitored during session;Repositioned;Ice applied     Hand Dominance Right   Extremity/Trunk Assessment Upper Extremity Assessment Upper Extremity Assessment: Overall WFL for tasks assessed   Lower Extremity Assessment Lower Extremity Assessment: Defer to PT evaluation   Cervical / Trunk Assessment Cervical / Trunk Assessment: Normal   Communication Communication Communication: No difficulties   Cognition Arousal/Alertness: Awake/alert Behavior During Therapy: WFL for tasks assessed/performed Overall Cognitive Status: Within Functional Limits for tasks assessed                                     General Comments   pt pleasant and cooperative    Exercises    Shoulder Instructions  Home Living Family/patient expects to be discharged to:: Private residence Living Arrangements: Spouse/significant other Available Help at Discharge: Family Type of Home: House Home Access: Stairs to enter     [place]: One level     Bathroom Shower/Tub:  Tub/shower unit;Walk-in shower   Bathroom Toilet: Standard     Home Equipment: None          Prior Functioning/Environment Level of Independence: Independent                 OT Problem List: Decreased activity tolerance;Decreased knowledge of use of DME or AE;Impaired balance (sitting and/or standing);Pain      OT Treatment/Interventions: Self-care/ADL training;DME and/or AE instruction;Therapeutic activities;Patient/family education    OT Goals(Current goals can be found in the care plan section) Acute Rehab OT Goals Patient Stated Goal: regain PLOF and independence OT Goal Formulation: With patient Time For Goal Achievement: 11/16/17 Potential to Achieve Goals: Good ADL Goals Pt Will Perform Grooming: with supervision;with set-up;standing Pt Will Perform Lower Body Bathing: with min guard assist;with supervision;with set-up;with caregiver independent in assisting Pt Will Perform Lower Body Dressing: with min guard assist;with supervision;with set-up;with caregiver independent in assisting Pt Will Transfer to Toilet: with supervision;ambulating Pt Will Perform Toileting - Clothing Manipulation and hygiene: with supervision;with caregiver independent in assisting;sit to/from stand Pt Will Perform Tub/Shower Transfer: with min guard assist;with supervision;tub bench;rolling walker;ambulating;with caregiver independent in assisting  OT Frequency: Min 2X/week   Barriers to D/C:    no barriers       Co-evaluation              AM-PAC PT "6 Clicks" Daily Activity     Outcome Measure Help from another person eating meals?: None Help from another person taking care of personal grooming?: A Little Help from another person toileting, which includes using toliet, bedpan, or urinal?: A Little Help from another person bathing (including washing, rinsing, drying)?: A Little Help from another person to put on and taking off regular upper body clothing?: None Help from  another person to put on and taking off regular lower body clothing?: A Little 6 Click Score: 20   End of Session Equipment Utilized During Treatment: Gait belt;Rolling walker;Other (comment)(3 in 1) CPM Right Knee CPM Right Knee: Off  Activity Tolerance: Patient tolerated treatment well Patient left: in chair;with call bell/phone within reach  OT Visit Diagnosis: Unsteadiness on feet (R26.81);Other abnormalities of gait and mobility (R26.89);Pain Pain - Right/Left: Left Pain - part of body: Knee                Time: 1660-6301 OT Time Calculation (min): 34 min Charges:  OT General Charges $OT Visit: 1 Visit OT Evaluation $OT Eval Low Complexity: 1 Low OT Treatments $Therapeutic Activity: 8-22 mins G-Codes: OT G-codes **NOT FOR INPATIENT CLASS** Functional Assessment Tool Used: AM-PAC 6 Clicks Daily Activity     Britt Bottom 11/09/2017, 12:52 PM

## 2017-11-10 LAB — BASIC METABOLIC PANEL
ANION GAP: 7 (ref 5–15)
BUN: 10 mg/dL (ref 6–20)
CALCIUM: 8.7 mg/dL — AB (ref 8.9–10.3)
CO2: 29 mmol/L (ref 22–32)
CREATININE: 0.73 mg/dL (ref 0.61–1.24)
Chloride: 101 mmol/L (ref 101–111)
GFR calc Af Amer: >60 mL/min (ref >60)
GLUCOSE: 104 mg/dL — AB (ref 65–99)
Potassium: 3.1 mmol/L — ABNORMAL LOW (ref 3.5–5.1)
Sodium: 137 mmol/L (ref 135–145)

## 2017-11-10 LAB — CBC
HEMATOCRIT: 35.1 % — AB (ref 39.0–52.0)
Hemoglobin: 11.3 g/dL — ABNORMAL LOW (ref 13.0–17.0)
MCH: 28.4 pg (ref 26.0–34.0)
MCHC: 32.2 g/dL (ref 30.0–36.0)
MCV: 88.2 fL (ref 78.0–100.0)
PLATELETS: 207 10*3/uL (ref 150–400)
RBC: 3.98 MIL/uL — ABNORMAL LOW (ref 4.22–5.81)
RDW: 13.8 % (ref 11.5–15.5)
WBC: 11.2 10*3/uL — AB (ref 4.0–10.5)

## 2017-11-10 NOTE — Progress Notes (Signed)
   Subjective: 2 Days Post-Op Procedure(s) (LRB): LEFT TOTAL KNEE ARTHROPLASTY (Left) Patient reports pain as mild.   Patient seen in rounds by Dr. Lequita HaltAluisio. Patient is well, but has had some minor complaints of pain in the knee, requiring pain medications Patient is ready to go home later that afternoon following therapy goals.  Objective: Vital signs in last 24 hours: Temp:  [97.6 F (36.4 C)-98.7 F (37.1 C)] 98.7 F (37.1 C) (12/05 0626) Pulse Rate:  [56-62] 56 (12/05 0626) Resp:  [16-18] 17 (12/05 0626) BP: (129-170)/(67-82) 129/67 (12/05 0626) SpO2:  [95 %-98 %] 98 % (12/05 0626)  Intake/Output from previous day:  Intake/Output Summary (Last 24 hours) at 11/10/2017 0743 Last data filed at 11/10/2017 0655 Gross per 24 hour  Intake 2720 ml  Output 3175 ml  Net -455 ml    Intake/Output this shift: No intake/output data recorded.  Labs: Recent Labs    11/09/17 0538 11/10/17 0616  HGB 12.2* 11.3*   Recent Labs    11/09/17 0538 11/10/17 0616  WBC 13.2* 11.2*  RBC 4.17* 3.98*  HCT 36.9* 35.1*  PLT 206 207   Recent Labs    11/09/17 0538 11/10/17 0616  NA 137 137  K 3.5 3.1*  CL 104 101  CO2 28 29  BUN 12 10  CREATININE 0.75 0.73  GLUCOSE 120* 104*  CALCIUM 8.7* 8.7*   No results for input(s): LABPT, INR in the last 72 hours.  EXAM: General - Patient is Alert and Appropriate Extremity - Neurovascular intact Sensation intact distally Intact pulses distally Dorsiflexion/Plantar flexion intact Incision - clean, dry Motor Function - intact, moving foot and toes well on exam.   Assessment/Plan: 2 Days Post-Op Procedure(s) (LRB): LEFT TOTAL KNEE ARTHROPLASTY (Left) Procedure(s) (LRB): LEFT TOTAL KNEE ARTHROPLASTY (Left) Past Medical History:  Diagnosis Date  . Depression   . High cholesterol   . History of broken nose   . History of pneumonia 1998  . Hx of colonoscopy 2013  . Hypertension   . Injury of pelvis 1997  . Sleep apnea    Principal  Problem:   OA (osteoarthritis) of knee  Estimated body mass index is 37.23 kg/m as calculated from the following:   Height as of this encounter: 6\' 5"  (1.956 m).   Weight as of this encounter: 142.4 kg (314 lb). Up with therapy Diet - Cardiac diet Follow up - in 2 weeks Activity - WBAT Disposition - Home Condition Upon Discharge - Good D/C Meds - See DC Summary DVT Prophylaxis - Xarelto  Avel Peacerew Tanika Bracco, PA-C Orthopaedic Surgery 11/10/2017, 7:43 AM

## 2017-11-10 NOTE — Progress Notes (Signed)
Physical Therapy Treatment Patient Details Name: Jason Ingram MRN: [id number] DOB: March 09, 1956 Today's Date: 11/10/2017    History of Present Illness 61 yo male s/p L TKA 11/08/17    PT Comments    Improved pain control this afternoon. Reviewed/practiced exercises, gait training, and stair training. Issued HEP for pt to perform 2x/day until he begins OP PT. All education completed. Okay to d/c from PT standpoint-made RN aware.     Follow Up Recommendations  DC plan and follow up therapy as arranged by surgeon     Equipment Recommendations  (issued crutches by PT)    Recommendations for Other Services       Precautions / Restrictions Precautions Precautions: Fall Precaution Comments: reviewed no pillow under knee Required Braces or Orthoses: Knee Immobilizer - Left Knee Immobilizer - Left: Discontinue once straight leg raise with < 10 degree lag Restrictions Weight Bearing Restrictions: No LLE Weight Bearing: Weight bearing as tolerated    Mobility  Bed Mobility Overal bed mobility: Needs Assistance Bed Mobility: Supine to Sit;Sit to Supine     Supine to sit: Supervision Sit to supine: Supervision   General bed mobility comments: Supervision for safety and hooking LLE with R foot. Increased time.   Transfers Overall transfer level: Needs assistance Equipment used: Rolling walker (2 wheeled) Transfers: Sit to/from Stand Sit to Stand: Min guard;From elevated surface         General transfer comment: close guard for safety. VCs safety, hand/LE placement  Ambulation/Gait Ambulation/Gait assistance: Min guard Ambulation Distance (Feet): 100 Feet Assistive device: Rolling walker (2 wheeled) Gait Pattern/deviations: Step-to pattern     General Gait Details: close guard for safety. slow gait speed. Pt denied dizziness.    Stairs Stairs: Yes Min Assist  Stair Management: Step to pattern;Forwards;One rail Left;With cane Number of Stairs: 5 General stair  comments: up and over portable steps x 2. Attempted with just 1 rail but pt was unable. Practiced with 1 rail and a cane. Still some difficulty so suggested pt use 1 crutch, 1 rail. VCs safety, sequence, technique. Issued handout.  Wheelchair Mobility    Modified Rankin (Stroke Patients Only)       Balance Overall balance assessment: Needs assistance Sitting-balance support: No upper extremity supported Sitting balance-Leahy Scale: Good     Standing balance support: Single extremity supported;Bilateral upper extremity supported;During functional activity Standing balance-Leahy Scale: Fair                              Cognition Arousal/Alertness: Awake/alert Behavior During Therapy: WFL for tasks assessed/performed Overall Cognitive Status: Within Functional Limits for tasks assessed                                        Exercises Total Joint Exercises Ankle Circles/Pumps: AROM;10 reps;Supine Quad Sets: AROM;Both;10 reps;Supine Hip ABduction/ADduction: AAROM;Left;10 reps;Supine Straight Leg Raises: AROM;Left;10 reps;Supine Knee Flexion: AAROM;Left;10 reps;Seated Goniometric ROM: ~10-60 degrees    General Comments General comments (skin integrity, edema, etc.): Pt short of breath and limited by pain this session.       Pertinent Vitals/Pain Pain Assessment: 0-10 Pain Score: 7  Pain Location: L knee Pain Descriptors / Indicators: Aching;Tightness;Sore Pain Intervention(s): Monitored during session;Repositioned    Home Living  Prior Function            PT Goals (current goals can now be found in the care plan section) Acute Rehab PT Goals Patient Stated Goal: regain PLOF and independence Progress towards PT goals: Progressing toward goals    Frequency    7X/week      PT Plan Current plan remains appropriate    Co-evaluation              AM-PAC PT "6 Clicks" Daily Activity  Outcome  Measure  Difficulty turning over in bed (including adjusting bedclothes, sheets and blankets)?: A Little Difficulty moving from lying on back to sitting on the side of the bed? : A Little Difficulty sitting down on and standing up from a chair with arms (e.g., wheelchair, bedside commode, etc,.)?: A Little Help needed moving to and from a bed to chair (including a wheelchair)?: A Little Help needed walking in hospital room?: A Little Help needed climbing 3-5 steps with a railing? : A Little 6 Click Score: 18    End of Session Equipment Utilized During Treatment: Gait belt Activity Tolerance: Patient tolerated treatment well Patient left: in bed;with call bell/phone within reach   PT Visit Diagnosis: Muscle weakness (generalized) (M62.81);Difficulty in walking, not elsewhere classified (R26.2);Pain Pain - Right/Left: Left Pain - part of body: Knee     Time: 1610-96041200-1234 PT Time Calculation (min) (ACUTE ONLY): 34 min  Charges:  $Gait Training: 8-22 mins $Therapeutic Exercise: 8-22 mins                    G Codes:          Jason Ingram, MPT Pager: [phone]

## 2017-11-10 NOTE — Progress Notes (Signed)
Occupational Therapy Treatment Patient Details Name: Jason Ingram MRN: [id number] DOB: 02/13/1956 Today's Date: 11/10/2017    History of present illness 61 yo male s/p L TKA 11/08/17   OT comments  Pt demonstrating some progress toward OT goals. He was in significant pain with all mobility this session limiting his independence with toilet and tub transfers. Pt requesting to learn tub transfer with 3-in-1 rather than tub bench this session as he will have 3-in-1 at home initially. On initial rise from bed in preparation for ADL, pt requiring mod assist to stand; however, he was able to complete toilet transfer with BSC with min assist later in session. Pt required min assist and VC's for safety with tub transfer. He would continue to benefit from OT services while admitted in preparation for return home with his wife. Pt is concerned about his wife's ability to assist as she is currently sick. OT will continue to follow acutely.    Follow Up Recommendations  DC plan and follow up therapy as arranged by surgeon;Supervision/Assistance - 24 hour    Equipment Recommendations  3 in 1 bedside commode;Tub/shower bench;Other (comment)(AE kit for LB ADL)    Recommendations for Other Services      Precautions / Restrictions Precautions Precautions: Fall Precaution Comments: reviewed no pillow under knee Required Braces or Orthoses: Knee Immobilizer - Left Knee Immobilizer - Left: Discontinue once straight leg raise with < 10 degree lag Restrictions Weight Bearing Restrictions: Yes LLE Weight Bearing: Weight bearing as tolerated       Mobility Bed Mobility Overal bed mobility: Needs Assistance Bed Mobility: Supine to Sit     Supine to sit: Supervision     General bed mobility comments: Supervision for safety and hooking LLE with R foot. Increased time.   Transfers Overall transfer level: Needs assistance Equipment used: Rolling walker (2 wheeled) Transfers: Sit to/from Stand Sit to  Stand: Mod assist;Min guard         General transfer comment: Mod assist for initial rise into standing position from bed. Min guard assist from BSC     Balance Overall balance assessment: Needs assistance Sitting-balance support: No upper extremity supported Sitting balance-Leahy Scale: Good     Standing balance support: Single extremity supported;Bilateral upper extremity supported;During functional activity Standing balance-Leahy Scale: Fair                             ADL either performed or assessed with clinical judgement   ADL Overall ADL's : Needs assistance/impaired     Grooming: Wash/dry hands;Wash/dry face;Standing;Min guard                   Toilet Transfer: Ambulation;RW;BSC;Minimal assistance   Toileting- Clothing Manipulation and Hygiene: Supervision/safety;Sit to/from stand   Tub/ Shower Transfer: Minimal assistance;Ambulation;3 in 1;Tub transfer;Rolling walker   Functional mobility during ADLs: Minimal assistance;Rolling walker General ADL Comments: Pt interested in using 3-in-1 (already delivered) rather than tub bench and educated pt on safe use. Pt limited by pain with this requiring min assist. Educated on knee precautions related to ADL as well as safe toilet transfers.      Vision   Vision Assessment?: No apparent visual deficits   Perception     Praxis      Cognition Arousal/Alertness: Awake/alert Behavior During Therapy: WFL for tasks assessed/performed Overall Cognitive Status: Within Functional Limits for tasks assessed                                            Exercises     Shoulder Instructions       General Comments Pt short of breath and limited by pain this session.     Pertinent Vitals/ Pain       Pain Assessment: 0-10 Pain Location: L knee Pain Descriptors / Indicators: Aching;Tightness;Sore Pain Intervention(s): Limited activity within patient's tolerance;Monitored during  session;Repositioned;Ice applied  Home Living                                          Prior Functioning/Environment              Frequency  Min 2X/week        Progress Toward Goals  OT Goals(current goals can now be found in the care plan section)  Progress towards OT goals: Progressing toward goals  Acute Rehab OT Goals Patient Stated Goal: regain PLOF and independence OT Goal Formulation: With patient Time For Goal Achievement: 11/16/17 Potential to Achieve Goals: Good  Plan Discharge plan needs to be updated(now recommending 24 hour assist initially)    Co-evaluation                 AM-PAC PT "6 Clicks" Daily Activity     Outcome Measure   Help from another person eating meals?: None Help from another person taking care of personal grooming?: A Little Help from another person toileting, which includes using toliet, bedpan, or urinal?: A Little Help from another person bathing (including washing, rinsing, drying)?: A Little Help from another person to put on and taking off regular upper body clothing?: None Help from another person to put on and taking off regular lower body clothing?: A Little 6 Click Score: 20    End of Session Equipment Utilized During Treatment: Gait belt;Rolling walker  OT Visit Diagnosis: Unsteadiness on feet (R26.81);Other abnormalities of gait and mobility (R26.89);Pain Pain - Right/Left: Left Pain - part of body: Knee   Activity Tolerance Patient tolerated treatment well   Patient Left in chair;with call bell/phone within reach   Nurse Communication Mobility status        Time: 2725-3664 OT Time Calculation (min): 26 min  Charges: OT General Charges $OT Visit: 1 Visit OT Treatments $Self Care/Home Management : 23-37 mins  Norman Herrlich, MS OTR/L  Pager: [facility] 11/10/2017, 10:22 AM

## 2017-11-10 NOTE — Progress Notes (Signed)
Physical Therapy Treatment Patient Details Name: Roxy MannsGeorge Helms MRN: [id number] DOB: 21-Jul-1956 Today's Date: 11/10/2017    History of Present Illness 61 yo male s/p L TKA 11/08/17    PT Comments    Limited session this am due to pt experiencing significant pain with all activity. Pt also c/o dizziness while walking so deferred further ambulation and stair negotiation for safety reasons. Will plan to have a 2nd session prior to possible d/c later today.     Follow Up Recommendations  DC plan and follow up therapy as arranged by surgeon     Equipment Recommendations  Rolling walker with 5" wheels    Recommendations for Other Services       Precautions / Restrictions Precautions Precautions: Fall Precaution Comments: reviewed no pillow under knee Required Braces or Orthoses: Knee Immobilizer - Left Knee Immobilizer - Left: Discontinue once straight leg raise with < 10 degree lag Restrictions Weight Bearing Restrictions: No LLE Weight Bearing: Weight bearing as tolerated    Mobility  Bed Mobility Overal bed mobility: Needs Assistance Bed Mobility: Sit to Supine      Sit to supine: Min guard;HOB elevated   General bed mobility comments: Supervision for safety and hooking LLE with R foot. Increased time.   Transfers Overall transfer level: Needs assistance Equipment used: Rolling walker (2 wheeled) Transfers: Sit to/from Stand Sit to Stand: Min assist;From elevated surface         General transfer comment: Assist to rise, stabilize, control descent. VCs safety, technique, hand/LE placement  Ambulation/Gait Ambulation/Gait assistance: Min assist Ambulation Distance (Feet): 50 Feet Assistive device: Rolling walker (2 wheeled) Gait Pattern/deviations: Step-to pattern;Step-through pattern;Decreased stride length;Antalgic     General Gait Details: Pt c/o dizziness so decreased distance for safety reasons. Pt also c/o significant pain.    Stairs             Wheelchair Mobility    Modified Rankin (Stroke Patients Only)       Balance Overall balance assessment: Needs assistance Sitting-balance support: No upper extremity supported Sitting balance-Leahy Scale: Good     Standing balance support: Single extremity supported;Bilateral upper extremity supported;During functional activity Standing balance-Leahy Scale: Fair                              Cognition Arousal/Alertness: Awake/alert Behavior During Therapy: WFL for tasks assessed/performed Overall Cognitive Status: Within Functional Limits for tasks assessed                                        Exercises Total Joint Exercises Ankle Circles/Pumps: AROM;10 reps;Supine Quad Sets: AROM;Both;10 reps;Supine Hip ABduction/ADduction: AAROM;Left;10 reps;Supine    General Comments General comments (skin integrity, edema, etc.): Pt short of breath and limited by pain this session.       Pertinent Vitals/Pain Pain Assessment: 0-10 Pain Score: 10-Worst pain ever Pain Location: L knee Pain Descriptors / Indicators: Aching;Tightness;Sore Pain Intervention(s): Limited activity within patient's tolerance;Repositioned;Patient requesting pain meds-RN notified;Ice applied    Home Living                      Prior Function            PT Goals (current goals can now be found in the care plan section) Acute Rehab PT Goals Patient Stated Goal: regain PLOF and independence Progress towards PT  goals: Not progressing toward goals - comment(increased pain this session)    Frequency    7X/week      PT Plan Current plan remains appropriate    Co-evaluation              AM-PAC PT "6 Clicks" Daily Activity  Outcome Measure  Difficulty turning over in bed (including adjusting bedclothes, sheets and blankets)?: A Little Difficulty moving from lying on back to sitting on the side of the bed? : A Little Difficulty sitting down on and  standing up from a chair with arms (e.g., wheelchair, bedside commode, etc,.)?: Unable Help needed moving to and from a bed to chair (including a wheelchair)?: A Little Help needed walking in hospital room?: A Little Help needed climbing 3-5 steps with a railing? : A Lot 6 Click Score: 15    End of Session Equipment Utilized During Treatment: Gait belt Activity Tolerance: Patient limited by pain Patient left: in bed;with call bell/phone within reach   PT Visit Diagnosis: Muscle weakness (generalized) (M62.81);Difficulty in walking, not elsewhere classified (R26.2);Pain Pain - Right/Left: Left Pain - part of body: Knee     Time: [id number] PT Time Calculation (min) (ACUTE ONLY): 21 min  Charges:  $Gait Training: 8-22 mins                    G Codes:          Rebeca AlertJannie Liliana Dang, MPT Pager: [phone]

## 2017-11-11 ENCOUNTER — Other Ambulatory Visit: Payer: Self-pay

## 2017-11-11 ENCOUNTER — Observation Stay (HOSPITAL_COMMUNITY)
Admission: EM | Admit: 2017-11-11 | Discharge: 2017-11-12 | Disposition: A | Discharge status: Home / Self Care | Payer: BLUE CROSS/BLUE SHIELD | Attending: Internal Medicine | Admitting: Internal Medicine

## 2017-11-11 ENCOUNTER — Encounter (HOSPITAL_COMMUNITY): Payer: Self-pay

## 2017-11-11 ENCOUNTER — Emergency Department (HOSPITAL_COMMUNITY): Payer: BLUE CROSS/BLUE SHIELD

## 2017-11-11 DIAGNOSIS — E782 Mixed hyperlipidemia: Secondary | ICD-10-CM | POA: Diagnosis not present

## 2017-11-11 DIAGNOSIS — E876 Hypokalemia: Secondary | ICD-10-CM | POA: Insufficient documentation

## 2017-11-11 DIAGNOSIS — Z79899 Other long term (current) drug therapy: Secondary | ICD-10-CM | POA: Insufficient documentation

## 2017-11-11 DIAGNOSIS — Z7982 Long term (current) use of aspirin: Secondary | ICD-10-CM | POA: Insufficient documentation

## 2017-11-11 DIAGNOSIS — I959 Hypotension, unspecified: Secondary | ICD-10-CM | POA: Diagnosis not present

## 2017-11-11 DIAGNOSIS — G4733 Obstructive sleep apnea (adult) (pediatric): Secondary | ICD-10-CM | POA: Insufficient documentation

## 2017-11-11 DIAGNOSIS — G9341 Metabolic encephalopathy: Principal | ICD-10-CM | POA: Insufficient documentation

## 2017-11-11 DIAGNOSIS — R262 Difficulty in walking, not elsewhere classified: Secondary | ICD-10-CM | POA: Diagnosis not present

## 2017-11-11 DIAGNOSIS — R5082 Postprocedural fever: Secondary | ICD-10-CM | POA: Diagnosis not present

## 2017-11-11 DIAGNOSIS — N400 Enlarged prostate without lower urinary tract symptoms: Secondary | ICD-10-CM | POA: Insufficient documentation

## 2017-11-11 DIAGNOSIS — Z96652 Presence of left artificial knee joint: Secondary | ICD-10-CM | POA: Diagnosis not present

## 2017-11-11 DIAGNOSIS — R509 Fever, unspecified: Secondary | ICD-10-CM | POA: Diagnosis not present

## 2017-11-11 DIAGNOSIS — R4020 Unspecified coma: Secondary | ICD-10-CM | POA: Diagnosis not present

## 2017-11-11 DIAGNOSIS — I10 Essential (primary) hypertension: Secondary | ICD-10-CM | POA: Insufficient documentation

## 2017-11-11 DIAGNOSIS — G629 Polyneuropathy, unspecified: Secondary | ICD-10-CM | POA: Insufficient documentation

## 2017-11-11 DIAGNOSIS — R4182 Altered mental status, unspecified: Secondary | ICD-10-CM

## 2017-11-11 DIAGNOSIS — E860 Dehydration: Secondary | ICD-10-CM | POA: Insufficient documentation

## 2017-11-11 DIAGNOSIS — R42 Dizziness and giddiness: Secondary | ICD-10-CM | POA: Diagnosis not present

## 2017-11-11 DIAGNOSIS — F329 Major depressive disorder, single episode, unspecified: Secondary | ICD-10-CM | POA: Diagnosis not present

## 2017-11-11 LAB — I-STAT CG4 LACTIC ACID, ED
LACTIC ACID, VENOUS: 0.61 mmol/L (ref 0.5–1.9)
Lactic Acid, Venous: 0.83 mmol/L (ref 0.5–1.9)

## 2017-11-11 LAB — COMPREHENSIVE METABOLIC PANEL
ALBUMIN: 2.9 g/dL — AB (ref 3.5–5.0)
ALK PHOS: 62 U/L (ref 38–126)
ALT: 14 U/L — AB (ref 17–63)
ANION GAP: 8 (ref 5–15)
AST: 15 U/L (ref 15–41)
BILIRUBIN TOTAL: 0.9 mg/dL (ref 0.3–1.2)
BUN: 16 mg/dL (ref 6–20)
CALCIUM: 8.5 mg/dL — AB (ref 8.9–10.3)
CO2: 30 mmol/L (ref 22–32)
CREATININE: 0.97 mg/dL (ref 0.61–1.24)
Chloride: 97 mmol/L — ABNORMAL LOW (ref 101–111)
GFR calc Af Amer: >60 mL/min (ref >60)
GFR calc non Af Amer: >60 mL/min (ref >60)
GLUCOSE: 140 mg/dL — AB (ref 65–99)
Potassium: 3 mmol/L — ABNORMAL LOW (ref 3.5–5.1)
Sodium: 135 mmol/L (ref 135–145)
TOTAL PROTEIN: 6.1 g/dL — AB (ref 6.5–8.1)

## 2017-11-11 LAB — CBC WITH DIFFERENTIAL/PLATELET
BASOS PCT: 0 %
Basophils Absolute: 0 10*3/uL (ref 0.0–0.1)
Eosinophils Absolute: 0 10*3/uL (ref 0.0–0.7)
Eosinophils Relative: 1 %
HEMATOCRIT: 34.5 % — AB (ref 39.0–52.0)
HEMOGLOBIN: 11.3 g/dL — AB (ref 13.0–17.0)
LYMPHS ABS: 1.5 10*3/uL (ref 0.7–4.0)
Lymphocytes Relative: 17 %
MCH: 28.8 pg (ref 26.0–34.0)
MCHC: 32.8 g/dL (ref 30.0–36.0)
MCV: 88 fL (ref 78.0–100.0)
MONOS PCT: 10 %
Monocytes Absolute: 0.9 10*3/uL (ref 0.1–1.0)
NEUTROS ABS: 6.2 10*3/uL (ref 1.7–7.7)
NEUTROS PCT: 72 %
Platelets: 233 10*3/uL (ref 150–400)
RBC: 3.92 MIL/uL — ABNORMAL LOW (ref 4.22–5.81)
RDW: 13.7 % (ref 11.5–15.5)
WBC: 8.6 10*3/uL (ref 4.0–10.5)

## 2017-11-11 LAB — URINALYSIS, ROUTINE W REFLEX MICROSCOPIC
Bilirubin Urine: NEGATIVE
Glucose, UA: NEGATIVE mg/dL
Hgb urine dipstick: NEGATIVE
Ketones, ur: 5 mg/dL — AB
Leukocytes, UA: NEGATIVE
Nitrite: NEGATIVE
Protein, ur: NEGATIVE mg/dL
Specific Gravity, Urine: 1.011 (ref 1.005–1.030)
pH: 7 (ref 5.0–8.0)

## 2017-11-11 MED ORDER — SODIUM CHLORIDE 0.9 % IV BOLUS (SEPSIS)
500.0000 mL | Freq: Once | INTRAVENOUS | Status: AC
  Administered 2017-11-11: 500 mL via INTRAVENOUS

## 2017-11-11 MED ORDER — SODIUM CHLORIDE 0.9 % IV BOLUS (SEPSIS)
500.0000 mL | Freq: Once | INTRAVENOUS | Status: AC
  Administered 2017-11-12: 500 mL via INTRAVENOUS

## 2017-11-11 MED ORDER — OXYCODONE-ACETAMINOPHEN 5-325 MG PO TABS
1.0000 | ORAL_TABLET | Freq: Once | ORAL | Status: AC
  Administered 2017-11-11: 1 via ORAL
  Filled 2017-11-11: qty 1

## 2017-11-11 NOTE — ED Triage Notes (Signed)
EMS reports pt took Oxycodone 10mg  at 1600 post left knee surgery on Monday, started feeling odd, dizziness and hallucinating, wife called for assessment.  BP 130/90 HR 100 sp02 95 CBG 241

## 2017-11-11 NOTE — ED Provider Notes (Signed)
[name]-EMERGENCY DEPT Provider Note   CSN: [id number] Arrival date & time: 11/11/17  1832     History   Chief Complaint Chief Complaint  Patient presents with  . Dizziness    HPI Jason Ingram is a 61 y.o. male.  HPI Jason Ingram is a 61 y.o. male presents to emergency department complaining of altered mental status.  Patient is status post total knee replacement, left knee, 4 days ago by Dr. Lequita Halt.  According to patient's wife, patient woke up from his nap today, completely disoriented.  He was talking out of his head and he thought he was in a different room.  He was also more agitated than usual.  Temperature at home was 100.2, wife concerned about infection and brought patient to emergency department.  Patient states his left knee is doing "okay" and states he is doing all the exercises and is able to get up and pivot on it.  Patient states his knee is very painful with movement, but states "it supposed to her to just had surgery."  Patient has been on Percocet, 2 tablets of 5 mg every 4 hours which she has been on since the day of surgery.  He denies any new medications otherwise.  He denies any other symptoms.  He states he had an infection in the same knee 15 years ago.  He states he had reconstruction in 1970s.  He states his knee is red and warm, but not sure if that is from the surgery.  Past Medical History:  Diagnosis Date  . Depression   . High cholesterol   . History of broken nose   . History of pneumonia 1998  . Hx of colonoscopy 2013  . Hypertension   . Injury of pelvis 1997  . Sleep apnea     Patient Active Problem List   Diagnosis Date Noted  . OA (osteoarthritis) of knee 11/08/2017  . Mixed hyperlipidemia 09/29/2017  . High risk medication use 09/29/2017  . Hypertension 08/12/2016  . Depression 08/12/2016  . Knee pain 08/12/2016  . Testosterone deficiency 08/12/2016  . Morbid obesity due to excess calories (HCC) 08/12/2016  .  OSA (obstructive sleep apnea) 06/25/2016    Past Surgical History:  Procedure Laterality Date  . COLONOSCOPY  05/2012   Dr. Carlisle Cater  . FRACTURE SURGERY    . HAND SURGERY  2000   [facility]  . JOINT REPLACEMENT    . KNEE SURGERY  12/07/1976   [facility]  . KNEE SURGERY  2007  . SKIN GRAFT  1962   [facility]   . TOTAL KNEE ARTHROPLASTY Left 11/08/2017   Procedure: LEFT TOTAL KNEE ARTHROPLASTY;  Surgeon: Ollen Gross, MD;  Location: [facility] ORS;  Service: Orthopedics;  Laterality: Left;       Home Medications    Prior to Admission medications   Medication Sig Start Date End Date Taking? Authorizing Provider  baclofen (LIORESAL) 10 MG tablet Take 10 mg 3 (three) times daily as needed by mouth for muscle spasms.   Yes [provider]  cloNIDine (CATAPRES) 0.3 MG tablet TAKE 1 TABLET(0.3 MG) BY MOUTH DAILY 10/14/17  Yes Kirt Boys, DO  finasteride (PROSCAR) 5 MG tablet Take 1 tablet (5 mg total) by mouth daily. 08/16/17  Yes Montez Morita, Monica, DO  gabapentin (NEURONTIN) 300 MG capsule Take 1 capsule (300 mg total) by mouth 3 (three) times daily. Gabapentin 300 mg Protocol Take a 300 mg capsule three times a day for one week, Then  a 300 mg capsule twice a day for one week, Then a 300 mg capsule once a day for one week, then discontinue the Gabapentin. 11/09/17  Yes Perkins, Alexzandrew L, PA-C  methocarbamol (ROBAXIN) 500 MG tablet Take 1 tablet (500 mg total) by mouth every 6 (six) hours as needed for muscle spasms. 11/09/17  Yes Perkins, Alexzandrew L, PA-C  oxyCODONE (OXY IR/ROXICODONE) 5 MG immediate release tablet Take 1-2 tablets (5-10 mg total) by mouth every 4 (four) hours as needed for moderate pain or severe pain. 11/09/17  Yes Perkins, Alexzandrew L, PA-C  PARoxetine (PAXIL) 20 MG tablet TAKE 1 TABLET(20 MG) BY MOUTH DAILY 10/14/17  Yes Kirt Boysarter, Monica, DO  rivaroxaban (XARELTO) 10 MG TABS tablet Take 1 tablet (10 mg total) by mouth daily with breakfast. Take Xarelto for two and a  half more weeks following discharge from the hospital, then discontinue Xarelto. Once the patient has completed the blood thinner regimen, then take a Baby 81 mg Aspirin daily for three more weeks. 11/10/17  Yes Perkins, Alexzandrew L, PA-C  simvastatin (ZOCOR) 40 MG tablet TAKE 1 TABLET(40 MG) BY MOUTH AT BEDTIME FOR CHOLESTEROL 10/14/17  Yes Kirt Boysarter, Monica, DO  tamsulosin (FLOMAX) 0.4 MG CAPS capsule Take 0.4 mg daily after breakfast by mouth.   Yes [provider]  triamterene-hydrochlorothiazide (DYAZIDE) 37.5-25 MG capsule TAKE 1 CAPSULE BY MOUTH DAILY 10/14/17  Yes Kirt Boysarter, Monica, DO    Family History Family History  Problem Relation Age of Onset  . Lymphoma Mother   . Bipolar disorder Son   . Heart disease Other        multiple    Social History Social History   Tobacco Use  . Smoking status: Never Smoker  . Smokeless tobacco: Never Used  Substance Use Topics  . Alcohol use: Yes    Alcohol/week: 0.6 oz    Types: 1 Standard drinks or equivalent per week    Comment: 4 drinks weekly  . Drug use: No     Allergies   Patient has no known allergies.   Review of Systems Review of Systems  Constitutional: Positive for fever. Negative for chills.  Respiratory: Positive for cough. Negative for chest tightness and shortness of breath.   Cardiovascular: Negative for chest pain, palpitations and leg swelling.  Gastrointestinal: Negative for abdominal distention, abdominal pain, diarrhea, nausea and vomiting.  Genitourinary: Negative for dysuria, frequency, hematuria and urgency.  Musculoskeletal: Positive for arthralgias and joint swelling. Negative for myalgias, neck pain and neck stiffness.  Skin: Negative for rash.  Allergic/Immunologic: Negative for immunocompromised state.  Neurological: Negative for dizziness, weakness, light-headedness, numbness and headaches.  Psychiatric/Behavioral: Positive for agitation and confusion.  All other systems reviewed and are  negative.    Physical Exam Updated Vital Signs BP (!) 104/59   Pulse 70   Temp 98.3 F (36.8 C) (Oral)   Resp 16   Ht 6\' 5"  (1.956 m)   Wt (!) 138.3 kg (305 lb)   SpO2 95%   BMI 36.17 kg/m   Physical Exam  Constitutional: He is oriented to person, place, and time. He appears well-developed and well-nourished. No distress.  HENT:  Head: Normocephalic and atraumatic.  Eyes: Conjunctivae are normal.  Neck: Neck supple.  Cardiovascular: Normal rate, regular rhythm and normal heart sounds.  Pulmonary/Chest: Effort normal. No respiratory distress. He has no wheezes. He has no rales.  Abdominal: Soft. Bowel sounds are normal. He exhibits no distension. There is no tenderness. There is no rebound.  Musculoskeletal:  He exhibits no edema.  Significant swelling to the left knee.  Incision appears to be normal, with Steri-Strips intact.  No evidence of dehiscence, drainage, erythema around the incision.  Knee joint is diffusely erythematous, warm, tender.  Pain with any range of motion of the knee joint.  Neurological: He is alert and oriented to person, place, and time.  Skin: Skin is warm and dry.  Nursing note and vitals reviewed.       ED Treatments / Results  Labs (all labs ordered are listed, but only abnormal results are displayed) Labs Reviewed  CBC WITH DIFFERENTIAL/PLATELET - Abnormal; Notable for the following components:      Result Value   RBC 3.92 (*)    Hemoglobin 11.3 (*)    HCT 34.5 (*)    All other components within normal limits  COMPREHENSIVE METABOLIC PANEL - Abnormal; Notable for the following components:   Potassium 3.0 (*)    Chloride 97 (*)    Glucose, Bld 140 (*)    Calcium 8.5 (*)    Total Protein 6.1 (*)    Albumin 2.9 (*)    ALT 14 (*)    All other components within normal limits  URINALYSIS, ROUTINE W REFLEX MICROSCOPIC - Abnormal; Notable for the following components:   Ketones, ur 5 (*)    All other components within normal limits  I-STAT  CG4 LACTIC ACID, ED  I-STAT CG4 LACTIC ACID, ED    EKG  EKG Interpretation None       Radiology Dg Chest 2 View  Result Date: 11/11/2017 CLINICAL DATA:  Postoperative fever EXAM: CHEST  2 VIEW COMPARISON:  06/24/2016 FINDINGS: Cardiac shadow is stable. The lungs are well aerated bilaterally. No focal infiltrate or sizable effusion is seen. No acute bony abnormality is noted. IMPRESSION: No active cardiopulmonary disease. Electronically Signed   By: Alcide CleverMark  Lukens M.D.   On: 11/11/2017 21:36   Ct Head Wo Contrast  Result Date: 11/11/2017 CLINICAL DATA:  Altered level of consciousness after taking oxycodone at 16:00 post left knee surgery on Monday. Dizziness and hallucinations. EXAM: CT HEAD WITHOUT CONTRAST TECHNIQUE: Contiguous axial images were obtained from the base of the skull through the vertex without intravenous contrast. COMPARISON:  None. FINDINGS: Brain: No evidence of acute infarction, hemorrhage, hydrocephalus, extra-axial collection or mass lesion/mass effect. Normal variant cavum septi pellucidi and vergae. Vascular: No hyperdense vessel or unexpected calcification. Skull: Normal. Negative for fracture or focal lesion. Sinuses/Orbits: No acute finding. Partially imaged soft tissue density in the medial left maxillary sinus may represent a mucous retention cyst or polyp. Other: None IMPRESSION: No acute intracranial abnormality. Electronically Signed   By: Tollie Ethavid  Kwon M.D.   On: 11/11/2017 23:30    Procedures Procedures (including critical care time)  Medications Ordered in ED Medications - No data to display   Initial Impression / Assessment and Plan / ED Course  I have reviewed the triage vital signs and the nursing notes.  Pertinent labs & imaging results that were available during my care of the patient were reviewed by me and considered in my medical decision making (see chart for details).     Patient with acute altered mental status this evening, after waking up  from sleep.  Differential includes side effect of the opiates and baclofen, versus infection, versus disorientation from sleep.  Patient is currently alert and oriented x3.  His knee is swollen and erythematous, warm to the touch.  Will get labs.  Will monitor closely.  9:04 PM Normal WBC. Normal lactic. Pt stll "talking out of his head" according to patients wife.  I touched base with orthopedics, spoke with Dr. Winona Legato, who stated that since pt on blood thinners, sometimes knee would be erythematous and swollen.  He advised to stop patient's blood thinner as long as patient has never had any blood clots.  He advised to ice and elevate and follow-up in the office.  He suggested that his altered mental status is most likely from overmedication.  He stated that it is unusual to develop infection there is fresh after surgery.  Since patient still having some altered mental status, will discuss with family about admission for observation.  10:30 PM  I spoke with hospitalist, patient does does not meet admission criteria.  I explained to them that patient is still altered on and off, he is oriented x4, but on and off "talking out of his head according to his wife."  Patient does appear to be somnolent, patient's wife is concerned about him being too somnolent and drowsy and disoriented to go home.  Again according to tried hospitalist, patient does not meet admission criteria.  Discussed with Dr. Madilyn Hook again, will CT head, will ambulate.  12:39 AM CT head negative.  Dr. Madilyn Hook discussed patient with tried hospitalist again, they will admit.  Vitals:   11/11/17 2000 11/11/17 2036 11/11/17 2146 11/11/17 2355  BP: 124/64 124/64 98/61 (!) 97/59  Pulse: 65 65 66 63  Resp: (!) 28 (!) 28 20 14   Temp:   97.7 F (36.5 C)   TempSrc:   Oral   SpO2: 94% 93% 94% 96%  Weight:      Height:        Final Clinical Impressions(s) / ED Diagnoses   Final diagnoses:  Altered mental status, unspecified altered  mental status type    ED Discharge Orders    None       Jaynie Crumble, PA-C 11/12/17 0041    Tilden Fossa, MD 11/14/17 [phone]

## 2017-11-11 NOTE — ED Notes (Signed)
Bed: [id number] Expected date:  Expected time:  Means of arrival:  Comments: [id number] M dizzy after taking prescribed meds

## 2017-11-12 ENCOUNTER — Other Ambulatory Visit: Payer: Self-pay

## 2017-11-12 DIAGNOSIS — I9589 Other hypotension: Secondary | ICD-10-CM | POA: Diagnosis not present

## 2017-11-12 DIAGNOSIS — Z96652 Presence of left artificial knee joint: Secondary | ICD-10-CM

## 2017-11-12 DIAGNOSIS — E861 Hypovolemia: Secondary | ICD-10-CM | POA: Diagnosis not present

## 2017-11-12 DIAGNOSIS — R4182 Altered mental status, unspecified: Secondary | ICD-10-CM

## 2017-11-12 DIAGNOSIS — I10 Essential (primary) hypertension: Secondary | ICD-10-CM | POA: Diagnosis not present

## 2017-11-12 DIAGNOSIS — Z9989 Dependence on other enabling machines and devices: Secondary | ICD-10-CM

## 2017-11-12 DIAGNOSIS — R262 Difficulty in walking, not elsewhere classified: Secondary | ICD-10-CM | POA: Diagnosis present

## 2017-11-12 DIAGNOSIS — G4733 Obstructive sleep apnea (adult) (pediatric): Secondary | ICD-10-CM

## 2017-11-12 DIAGNOSIS — E860 Dehydration: Secondary | ICD-10-CM | POA: Diagnosis not present

## 2017-11-12 DIAGNOSIS — Z96659 Presence of unspecified artificial knee joint: Secondary | ICD-10-CM

## 2017-11-12 LAB — BASIC METABOLIC PANEL
ANION GAP: 7 (ref 5–15)
BUN: 15 mg/dL (ref 6–20)
CALCIUM: 8.2 mg/dL — AB (ref 8.9–10.3)
CO2: 29 mmol/L (ref 22–32)
CREATININE: 0.84 mg/dL (ref 0.61–1.24)
Chloride: 101 mmol/L (ref 101–111)
GFR calc Af Amer: >60 mL/min (ref >60)
GLUCOSE: 108 mg/dL — AB (ref 65–99)
Potassium: 3.3 mmol/L — ABNORMAL LOW (ref 3.5–5.1)
Sodium: 137 mmol/L (ref 135–145)

## 2017-11-12 LAB — CBC
HEMATOCRIT: 32.6 % — AB (ref 39.0–52.0)
HEMOGLOBIN: 10.7 g/dL — AB (ref 13.0–17.0)
MCH: 28.9 pg (ref 26.0–34.0)
MCHC: 32.8 g/dL (ref 30.0–36.0)
MCV: 88.1 fL (ref 78.0–100.0)
PLATELETS: 213 10*3/uL (ref 150–400)
RBC: 3.7 MIL/uL — AB (ref 4.22–5.81)
RDW: 13.4 % (ref 11.5–15.5)
WBC: 7.6 10*3/uL (ref 4.0–10.5)

## 2017-11-12 LAB — MAGNESIUM: Magnesium: 2 mg/dL (ref 1.7–2.4)

## 2017-11-12 MED ORDER — TRAMADOL HCL 50 MG PO TABS
50.0000 mg | ORAL_TABLET | Freq: Four times a day (QID) | ORAL | Status: DC | PRN
  Administered 2017-11-12: 50 mg via ORAL
  Filled 2017-11-12: qty 1

## 2017-11-12 MED ORDER — ENOXAPARIN SODIUM 40 MG/0.4ML ~~LOC~~ SOLN
40.0000 mg | Freq: Every day | SUBCUTANEOUS | Status: DC
  Administered 2017-11-12: 40 mg via SUBCUTANEOUS
  Filled 2017-11-12: qty 0.4

## 2017-11-12 MED ORDER — TAMSULOSIN HCL 0.4 MG PO CAPS
0.4000 mg | ORAL_CAPSULE | Freq: Every day | ORAL | Status: DC
  Administered 2017-11-12: 0.4 mg via ORAL
  Filled 2017-11-12: qty 1

## 2017-11-12 MED ORDER — POTASSIUM CHLORIDE CRYS ER 20 MEQ PO TBCR
40.0000 meq | EXTENDED_RELEASE_TABLET | Freq: Once | ORAL | Status: AC
  Administered 2017-11-12: 40 meq via ORAL
  Filled 2017-11-12: qty 2

## 2017-11-12 MED ORDER — RIVAROXABAN 10 MG PO TABS: 10.0000 mg | ORAL_TABLET | Freq: Every day | ORAL | Status: DC

## 2017-11-12 MED ORDER — ACETAMINOPHEN 650 MG RE SUPP: 650.0000 mg | Freq: Four times a day (QID) | RECTAL | Status: DC | PRN

## 2017-11-12 MED ORDER — ACETAMINOPHEN 325 MG PO TABS: 650.0000 mg | ORAL_TABLET | Freq: Four times a day (QID) | ORAL | Status: DC | PRN

## 2017-11-12 MED ORDER — TRAMADOL HCL 50 MG PO TABS: 50.0000 mg | ORAL_TABLET | Freq: Four times a day (QID) | ORAL | 0 refills | Status: DC | PRN

## 2017-11-12 MED ORDER — FINASTERIDE 5 MG PO TABS
5.0000 mg | ORAL_TABLET | Freq: Every day | ORAL | Status: DC
  Administered 2017-11-12: 5 mg via ORAL
  Filled 2017-11-12: qty 1

## 2017-11-12 MED ORDER — SIMVASTATIN 20 MG PO TABS: 20.0000 mg | ORAL_TABLET | Freq: Every day | ORAL | Status: DC

## 2017-11-12 MED ORDER — POTASSIUM CHLORIDE CRYS ER 20 MEQ PO TBCR
40.0000 meq | EXTENDED_RELEASE_TABLET | Freq: Two times a day (BID) | ORAL | Status: DC
  Administered 2017-11-12: 40 meq via ORAL
  Filled 2017-11-12: qty 2

## 2017-11-12 MED ORDER — OXYCODONE HCL 5 MG PO TABS: 5.0000 mg | ORAL_TABLET | Freq: Four times a day (QID) | ORAL | 0 refills | Status: DC | PRN

## 2017-11-12 MED ORDER — KETOROLAC TROMETHAMINE 30 MG/ML IJ SOLN
30.0000 mg | Freq: Four times a day (QID) | INTRAMUSCULAR | Status: DC | PRN
  Administered 2017-11-12: 30 mg via INTRAVENOUS
  Filled 2017-11-12: qty 1

## 2017-11-12 MED ORDER — POTASSIUM CHLORIDE CRYS ER 20 MEQ PO TBCR: 40.0000 meq | EXTENDED_RELEASE_TABLET | Freq: Once | ORAL | Status: AC

## 2017-11-12 MED ORDER — GABAPENTIN 300 MG PO CAPS
300.0000 mg | ORAL_CAPSULE | Freq: Three times a day (TID) | ORAL | Status: DC
  Administered 2017-11-12: 300 mg via ORAL
  Filled 2017-11-12: qty 1

## 2017-11-12 MED ORDER — POTASSIUM CHLORIDE IN NACL 20-0.9 MEQ/L-% IV SOLN
INTRAVENOUS | Status: DC
  Administered 2017-11-12: 02:00:00 via INTRAVENOUS
  Filled 2017-11-12 (×3): qty 1000

## 2017-11-12 MED ORDER — PAROXETINE HCL 20 MG PO TABS
20.0000 mg | ORAL_TABLET | Freq: Every day | ORAL | Status: DC
  Administered 2017-11-12: 20 mg via ORAL
  Filled 2017-11-12: qty 1

## 2017-11-12 MED ORDER — SENNOSIDES-DOCUSATE SODIUM 8.6-50 MG PO TABS
1.0000 | ORAL_TABLET | Freq: Every evening | ORAL | Status: DC | PRN
Start: 2017-11-12 — End: 2017-11-12

## 2017-11-12 MED ORDER — TRIAMTERENE-HCTZ 37.5-25 MG PO TABS
1.0000 | ORAL_TABLET | Freq: Every day | ORAL | Status: DC
  Administered 2017-11-12: 1 via ORAL
  Filled 2017-11-12: qty 1

## 2017-11-12 NOTE — Progress Notes (Signed)
Subjective: Patient feels a lot better today post-rehydration and decrease of narcotic analgesics. He wants to go HOME and not to a rehab center.  Events of yesterday noted and it appears that he had too much pain med and was dehydrated leading to his need for intervention   Objective: Vital signs in last 24 hours: Temp:  [97.7 F (36.5 C)-98.3 F (36.8 C)] 98 F (36.7 C) (12/07 0529) Pulse Rate:  [55-70] 62 (12/07 0529) Resp:  [12-28] 16 (12/07 0529) BP: (97-139)/(53-64) 139/56 (12/07 0529) SpO2:  [93 %-97 %] 97 % (12/07 0529) Weight:  [305 lb (138.3 kg)] 305 lb (138.3 kg) (12/06 1842)  Intake/Output from previous day: 12/06 0701 - 12/07 0700 In: 497.9 [I.V.:497.9] Out: 200 [Urine:200] Intake/Output this shift: Total I/O In: 240 [P.O.:240] Out: -   Recent Labs    11/10/17 0616 11/11/17 2014 11/12/17 0620  HGB 11.3* 11.3* 10.7*   Recent Labs    11/11/17 2014 11/12/17 0620  WBC 8.6 7.6  RBC 3.92* 3.70*  HCT 34.5* 32.6*  PLT 233 213   Recent Labs    11/11/17 2014 11/12/17 0620  NA 135 137  K 3.0* 3.3*  CL 97* 101  CO2 30 29  BUN 16 15  CREATININE 0.97 0.84  GLUCOSE 140* 108*  CALCIUM 8.5* 8.2*   No results for input(s): LABPT, INR in the last 72 hours.  Neurologically intact Neurovascular intact Dorsiflexion/Plantar flexion intact Incision: dressing C/D/I No cellulitis present Compartment soft  Assessment/Plan: Left TKA- Doing well this AM. Alert and oriented x 4 and sitting up eating breakfast and conversing normally. PT/OT this AM and if he does well enough he can go home. The PATIENT wants to go home and not to a rehab center and should be able to do so if he does well with the therapies today.   Homero FellersFrank Erich Kochan 11/12/2017, 7:56 AM

## 2017-11-12 NOTE — Discharge Summary (Signed)
Physician Discharge Summary  Mohit Zirbes ZOX:[id number] DOB: 1956/10/24 DOA: 11/11/2017  PCP: Kirt Boys, DO  Admit date: 11/11/2017 Discharge date: 11/12/2017  Admitted From:  Disposition:   Recommendations for Outpatient Follow-up:  1. Follow up with PCP in 1-2 weeks 2. Follow up with Orthopedic in 1-2 weeks  3. Reduce the dose of oxycodone to 5 mg to be only taken every 6 hours as needed for severe pain.  Otherwise tramadol 50 mg has been prescribed for moderate to severe pain. 4. Maintain adequate oral hydration  Home Health: None Equipment/Devices: Rolling walker, patient already has that  Discharge Condition: Home CODE STATUS: Full  Diet recommendation: Full   Brief/Interim Summary: 61 year old male with history of hypertension, hyperlipidemia, obstructive sleep apnea on CPAP, depression, postop day 5 for left total knee replacement came to the hospital for evaluation of change in mental status and dizziness.  Patient was alert awake oriented x4 when I saw him and only slightly drowsy.  He was given extra dose of narcotics in the ER due to severe pain.  Wife did not feel comfortable taking the patient home therefore he was admitted for closer monitoring and IV fluid hydration as he appears dehydrated as well.  He was given potassium repletion.  Overnight he did well in the following days mentation returned back to normal.  He was cleared by orthopedic surgery to go home.  He was prescribed tramadol and his narcotic dose was decreased and his frequency was increased as well.  He was advised to only take it during severe pain otherwise try using NSAIDs and Tylenol for the pain.  He does not have any complaints today and has reached maximum benefit from an hospital stay.  He is ready to be discharged today after physical therapy evaluation for any extra home needs.  He does not want to go to rehab.  Discharge Diagnoses:  Active Problems:   Ambulatory dysfunction  Ambulatory  dysfunction Postop day 5 for left total knee arthroplasty - His pain is better controlled, will decrease his narcotic frequency and dosage.  Will add tramadol.  Meanwhile he can continue taking Tylenol and NSAIDs. -Follow-up outpatient with orthopedics.  Appreciate their evaluation in the hospital by Dr Lequita Halt  Mild metabolic encephalopathy -This was secondary to narcotic use, no signs of active infection -This is resolved at this time -CT of the head in the ER was negative  Mild to moderate dehydration causing hypotension -Resolved with IV fluids  History of BPH -Continue home medications of Flomax and finasteride  Peripheral neuropathy -Continue gabapentin  Obstructive sleep apnea -Can use his home CPAP  Hyperlipidemia -Continue simvastatin  Discharge Instructions   Allergies as of 11/12/2017   No Known Allergies     Medication List    TAKE these medications   baclofen 10 MG tablet Commonly known as:  LIORESAL Take 10 mg 3 (three) times daily as needed by mouth for muscle spasms.   cloNIDine 0.3 MG tablet Commonly known as:  CATAPRES TAKE 1 TABLET(0.3 MG) BY MOUTH DAILY   finasteride 5 MG tablet Commonly known as:  PROSCAR Take 1 tablet (5 mg total) by mouth daily.   gabapentin 300 MG capsule Commonly known as:  NEURONTIN Take 1 capsule (300 mg total) by mouth 3 (three) times daily. Gabapentin 300 mg Protocol Take a 300 mg capsule three times a day for one week, Then a 300 mg capsule twice a day for one week, Then a 300 mg capsule once a day for one  week, then discontinue the Gabapentin.   methocarbamol 500 MG tablet Commonly known as:  ROBAXIN Take 1 tablet (500 mg total) by mouth every 6 (six) hours as needed for muscle spasms.   oxyCODONE 5 MG immediate release tablet Commonly known as:  Oxy IR/ROXICODONE Take 1 tablet (5 mg total) by mouth every 6 (six) hours as needed for moderate pain or severe pain. What changed:    how much to take  when to  take this   PARoxetine 20 MG tablet Commonly known as:  PAXIL TAKE 1 TABLET(20 MG) BY MOUTH DAILY   rivaroxaban 10 MG Tabs tablet Commonly known as:  XARELTO Take 1 tablet (10 mg total) by mouth daily with breakfast. Take Xarelto for two and a half more weeks following discharge from the hospital, then discontinue Xarelto. Once the patient has completed the blood thinner regimen, then take a Baby 81 mg Aspirin daily for three more weeks.   simvastatin 40 MG tablet Commonly known as:  ZOCOR TAKE 1 TABLET(40 MG) BY MOUTH AT BEDTIME FOR CHOLESTEROL   tamsulosin 0.4 MG Caps capsule Commonly known as:  FLOMAX Take 0.4 mg daily after breakfast by mouth.   traMADol 50 MG tablet Commonly known as:  ULTRAM Take 1 tablet (50 mg total) by mouth every 6 (six) hours as needed for moderate pain.   triamterene-hydrochlorothiazide 37.5-25 MG capsule Commonly known as:  DYAZIDE TAKE 1 CAPSULE BY MOUTH DAILY      Follow-up Information    Kirt BoysCarter, Monica, DO. Schedule an appointment as soon as possible for a visit in 1 week(s).   Specialty:  Internal Medicine Contact information: [address] [place] [id number] [phone]        Ollen GrossAluisio, Frank, MD. Schedule an appointment as soon as possible for a visit in 1 week(s).   Specialty:  Orthopedic Surgery Contact information: [address] [place] [id number] [phone]          No Known Allergies  On your next visit with your primary care physician please Get Medicines reviewed and adjusted.   Please request your Prim.MD to go over all Hospital Tests and Procedure/Radiological results at the follow up, please get all Hospital records sent to your Prim MD by signing hospital release before you go home.   If you experience worsening of your admission symptoms, develop shortness of breath, life threatening emergency, suicidal or homicidal thoughts you must seek medical attention immediately by calling  911 or calling your MD immediately  if symptoms less severe.  You Must read complete instructions/literature along with all the possible adverse reactions/side effects for all the Medicines you take and that have been prescribed to you. Take any new Medicines after you have completely understood and accpet all the possible adverse reactions/side effects.   Do not drive, operate heavy machinery, perform activities at heights, swimming or participation in water activities or provide baby sitting services if your were admitted for syncope or siezures until you have seen by Primary MD or a Neurologist and advised to do so again.  Do not drive when taking Pain medications.    Do not take more than prescribed Pain, Sleep and Anxiety Medications  Special Instructions: If you have smoked or chewed Tobacco  in the last 2 yrs please stop smoking, stop any regular Alcohol  and or any Recreational drug use.  Wear Seat belts while driving.   Please note  You were cared for by a hospitalist during your hospital stay.  If you have any questions about your discharge medications or the care you received while you were in the hospital after you are discharged, you can call the unit and asked to speak with the hospitalist on call if the hospitalist that took care of you is not available. Once you are discharged, your primary care physician will handle any further medical issues. Please note that NO REFILLS for any discharge medications will be authorized once you are discharged, as it is imperative that you return to your primary care physician (or establish a relationship with a primary care physician if you do not have one) for your aftercare needs so that they can reassess your need for medications and monitor your lab values.   Increase activity slowly        Consultations:  Orthopedic.    Procedures/Studies: Dg Chest 2 View  Result Date: 11/11/2017 CLINICAL DATA:  Postoperative fever EXAM:  CHEST  2 VIEW COMPARISON:  06/24/2016 FINDINGS: Cardiac shadow is stable. The lungs are well aerated bilaterally. No focal infiltrate or sizable effusion is seen. No acute bony abnormality is noted. IMPRESSION: No active cardiopulmonary disease. Electronically Signed   By: Alcide CleverMark  Lukens M.D.   On: 11/11/2017 21:36   Ct Head Wo Contrast  Result Date: 11/11/2017 CLINICAL DATA:  Altered level of consciousness after taking oxycodone at 16:00 post left knee surgery on Monday. Dizziness and hallucinations. EXAM: CT HEAD WITHOUT CONTRAST TECHNIQUE: Contiguous axial images were obtained from the base of the skull through the vertex without intravenous contrast. COMPARISON:  None. FINDINGS: Brain: No evidence of acute infarction, hemorrhage, hydrocephalus, extra-axial collection or mass lesion/mass effect. Normal variant cavum septi pellucidi and vergae. Vascular: No hyperdense vessel or unexpected calcification. Skull: Normal. Negative for fracture or focal lesion. Sinuses/Orbits: No acute finding. Partially imaged soft tissue density in the medial left maxillary sinus may represent a mucous retention cyst or polyp. Other: None IMPRESSION: No acute intracranial abnormality. Electronically Signed   By: Tollie Ethavid  Kwon M.D.   On: 11/11/2017 23:30      Subjective:   Discharge Exam: Vitals:   11/12/17 0529 11/12/17 0808  BP: (!) 139/56 (!) 124/56  Pulse: 62 60  Resp: 16   Temp: 98 F (36.7 C)   SpO2: 97%    Vitals:   11/12/17 0109 11/12/17 0202 11/12/17 0529 11/12/17 0808  BP: 110/64 (!) 125/53 (!) 139/56 (!) 124/56  Pulse: 64 (!) 55 62 60  Resp: 16 16 16    Temp: 97.7 F (36.5 C) 98 F (36.7 C) 98 F (36.7 C)   TempSrc: Oral Oral Oral   SpO2: 96% 97% 97%   Weight:      Height:        General: Pt is alert, awake, not in acute distress Cardiovascular: RRR, S1/S2 +, no rubs, no gallops Respiratory: CTA bilaterally, no wheezing, no rhonchi Abdominal: Soft, NT, ND, bowel sounds + Extremities: no  edema, no cyanosis;  Left lower extremity knee swelling noted with erythema without any signs of infection - improved      The results of significant diagnostics from this hospitalization (including imaging, microbiology, ancillary and laboratory) are listed below for reference.     Microbiology: Recent Results (from the past 240 hour(s))  Surgical pcr screen     Status: None   Collection Time: 11/03/17  9:15 AM  Result Value Ref Range Status   MRSA, PCR NEGATIVE NEGATIVE Final   Staphylococcus aureus NEGATIVE NEGATIVE Final    Comment: (NOTE) The Xpert  SA Assay (FDA approved for NASAL specimens in patients 49 years of age and older), is one component of a comprehensive surveillance program. It is not intended to diagnose infection nor to guide or monitor treatment.      Labs: BNP (last 3 results) No results for input(s): BNP in the last 8760 hours. Basic Metabolic Panel: Recent Labs  Lab 11/09/17 0538 11/10/17 0616 11/11/17 2014 11/12/17 0620  NA 137 137 135 137  K 3.5 3.1* 3.0* 3.3*  CL 104 101 97* 101  CO2 28 29 30 29   GLUCOSE 120* 104* 140* 108*  BUN 12 10 16 15   CREATININE 0.75 0.73 0.97 0.84  CALCIUM 8.7* 8.7* 8.5* 8.2*  MG  --   --   --  2.0   Liver Function Tests: Recent Labs  Lab 11/11/17 2014  AST 15  ALT 14*  ALKPHOS 62  BILITOT 0.9  PROT 6.1*  ALBUMIN 2.9*   No results for input(s): LIPASE, AMYLASE in the last 168 hours. No results for input(s): AMMONIA in the last 168 hours. CBC: Recent Labs  Lab 11/09/17 0538 11/10/17 0616 11/11/17 2014 11/12/17 0620  WBC 13.2* 11.2* 8.6 7.6  NEUTROABS  --   --  6.2  --   HGB 12.2* 11.3* 11.3* 10.7*  HCT 36.9* 35.1* 34.5* 32.6*  MCV 88.5 88.2 88.0 88.1  PLT 206 207 233 213   Cardiac Enzymes: No results for input(s): CKTOTAL, CKMB, CKMBINDEX, TROPONINI in the last 168 hours. BNP: Invalid input(s): POCBNP CBG: No results for input(s): GLUCAP in the last 168 hours. D-Dimer No results for  input(s): DDIMER in the last 72 hours. Hgb A1c No results for input(s): HGBA1C in the last 72 hours. Lipid Profile No results for input(s): CHOL, HDL, LDLCALC, TRIG, CHOLHDL, LDLDIRECT in the last 72 hours. Thyroid function studies No results for input(s): TSH, T4TOTAL, T3FREE, THYROIDAB in the last 72 hours.  Invalid input(s): FREET3 Anemia work up No results for input(s): VITAMINB12, FOLATE, FERRITIN, TIBC, IRON, RETICCTPCT in the last 72 hours. Urinalysis    Component Value Date/Time   COLORURINE YELLOW 11/11/2017 1929   APPEARANCEUR CLEAR 11/11/2017 1929   LABSPEC 1.011 11/11/2017 1929   PHURINE 7.0 11/11/2017 1929   GLUCOSEU NEGATIVE 11/11/2017 1929   HGBUR NEGATIVE 11/11/2017 1929   BILIRUBINUR NEGATIVE 11/11/2017 1929   KETONESUR 5 (A) 11/11/2017 1929   PROTEINUR NEGATIVE 11/11/2017 1929   NITRITE NEGATIVE 11/11/2017 1929   LEUKOCYTESUR NEGATIVE 11/11/2017 1929   Sepsis Labs Invalid input(s): PROCALCITONIN,  WBC,  LACTICIDVEN Microbiology Recent Results (from the past 240 hour(s))  Surgical pcr screen     Status: None   Collection Time: 11/03/17  9:15 AM  Result Value Ref Range Status   MRSA, PCR NEGATIVE NEGATIVE Final   Staphylococcus aureus NEGATIVE NEGATIVE Final    Comment: (NOTE) The Xpert SA Assay (FDA approved for NASAL specimens in patients 26 years of age and older), is one component of a comprehensive surveillance program. It is not intended to diagnose infection nor to guide or monitor treatment.      Time coordinating discharge: Over 30 minutes  SIGNED:   Dimple Nanas, MD  [facility] 11/12/2017, 9:50 AM Pager   If 7PM-7AM, please contact night-coverage [email or web address] Password [email or web address]

## 2017-11-12 NOTE — Evaluation (Signed)
Physical Therapy One Time Evaluation Patient Details Name: Jason Ingram MRN: [id number] DOB: [birth year] Today's Date: 11/12/2017   History of Present Illness  61 year old male with history of hypertension, hyperlipidemia, obstructive sleep apnea on CPAP, depression, postop left total knee replacement 11/08/17, came to the hospital for evaluation of change in mental status and dizziness and diagnosed with dehydration and mental status changes likely from narcotics.  Clinical Impression  Patient evaluated by Physical Therapy with no further acute PT needs identified. All education has been completed and the patient has no further questions.  Pt eager to mobilize and wishes to d/c home today.  Provided pt with safety cues for mobility and steps.  Spouse arrived as pt performing steps so verbally educated on safe technique.  Pt feels ready for d/c home today.  Recommended to spouse that pt have initial supervision for mobility for safety upon return home. See below for any follow-up Physical Therapy or equipment needs. PT is signing off. Thank you for this referral.     Follow Up Recommendations DC plan and follow up therapy as arranged by surgeon(spouse requesting [facility] services, RN notified)    Equipment Recommendations  None recommended by PT    Recommendations for Other Services       Precautions / Restrictions Precautions Precautions: Fall Restrictions LLE Weight Bearing: Weight bearing as tolerated      Mobility  Bed Mobility Overal bed mobility: Modified Independent                Transfers Overall transfer level: Needs assistance Equipment used: Rolling walker (2 wheeled) Transfers: Sit to/from Stand Sit to Stand: Min guard;Supervision         General transfer comment: verbal cues for safety  Ambulation/Gait Ambulation/Gait assistance: Min guard Ambulation Distance (Feet): 200 Feet Assistive device: Rolling walker (2 wheeled) Gait Pattern/deviations: Step-through  pattern;Decreased stance time - left;Ataxic;Trunk flexed     General Gait Details: verbal cues for improved posture, tends to forward flex at trunk   Stairs Stairs: Yes Stairs assistance: Min guard Stair Management: One rail Left;Step to pattern;Forwards;With crutches Number of Stairs: 2 General stair comments: performed with L rail and one crutch, verbal cues for sequence, safety, crutch positioning, spouse verbally educated as well  Wheelchair Mobility    Modified Rankin (Stroke Patients Only)       Balance                                             Pertinent Vitals/Pain Pain Assessment: 0-10 Pain Score: 5  Pain Location: L knee Pain Descriptors / Indicators: Aching;Tightness;Sore Pain Intervention(s): Limited activity within patient's tolerance;Monitored during session    [place] expects to be discharged to:: Private residence Living Arrangements: Spouse/significant other Available Help at Discharge: Family Type of Home: House Home Access: Stairs to enter Entrance Stairs-Rails: Right Entrance Stairs-Number of Steps: 3-4 Home Layout: One level Home Equipment: Walker - 2 wheels;Crutches      Prior Function Level of Independence: Independent with assistive device(s)               Hand Dominance   Dominant Hand: Right    Extremity/Trunk Assessment        Lower Extremity Assessment Lower Extremity Assessment: LLE deficits/detail LLE Deficits / Details: observed limited knee flexion and extension post op TKA a few days ago  Communication   Communication: No difficulties  Cognition Arousal/Alertness: Awake/alert Behavior During Therapy: WFL for tasks assessed/performed Overall Cognitive Status: Within Functional Limits for tasks assessed                                        General Comments      Exercises     Assessment/Plan    PT Assessment All further PT needs can be met in  the next venue of care  PT Problem List Decreased strength;Decreased range of motion;Decreased mobility;Decreased balance;Decreased activity tolerance;Decreased knowledge of use of DME;Pain;Decreased knowledge of precautions       PT Treatment Interventions      PT Goals (Current goals can be found in the Care Plan section)  Acute Rehab PT Goals Patient Stated Goal: home today PT Goal Formulation: All assessment and education complete, DC therapy    Frequency     Barriers to discharge        Co-evaluation               AM-PAC PT "6 Clicks" Daily Activity  Outcome Measure Difficulty turning over in bed (including adjusting bedclothes, sheets and blankets)?: None Difficulty moving from lying on back to sitting on the side of the bed? : None Difficulty sitting down on and standing up from a chair with arms (e.g., wheelchair, bedside commode, etc,.)?: None Help needed moving to and from a bed to chair (including a wheelchair)?: A Little Help needed walking in hospital room?: A Little Help needed climbing 3-5 steps with a railing? : A Little 6 Click Score: 21    End of Session   Activity Tolerance: Patient tolerated treatment well Patient left: in bed;with call bell/phone within reach;with family/visitor present   PT Visit Diagnosis: Difficulty in walking, not elsewhere classified (R26.2)    Time: 7889-3388 PT Time Calculation (min) (ACUTE ONLY): 18 min   Charges:   PT Evaluation $PT Eval Low Complexity: 1 Low     PT G Codes:   PT G-Codes **NOT FOR INPATIENT CLASS** Functional Assessment Tool Used: AM-PAC 6 Clicks Basic Mobility;Clinical judgement Functional Limitation: Mobility: Walking and moving around Mobility: Walking and Moving Around Current Status (U6666): At least 20 percent but less than 40 percent impaired, limited or restricted Mobility: Walking and Moving Around Goal Status [phone]): At least 1 percent but less than 20 percent impaired, limited or  restricted Mobility: Walking and Moving Around Discharge Status [phone]): At least 1 percent but less than 20 percent impaired, limited or restricted   Carmelia Bake, PT, DPT 11/12/2017 Pager: [phone]  [name] 11/12/2017, 12:22 PM

## 2017-11-12 NOTE — H&P (Signed)
History and Physical    Jason Ingram RUE:[id number] DOB: [birth year] DOA: 11/11/2017  PCP: Kirt Boysarter, Monica, DO Patient coming from: Home  Chief Complaint: Drowsiness  HPI: Jason MannsGeorge Ingram is a 61 y.o. male with medical history significant of hypertension, hyperlipidemia, obstructive sleep apnea, depression, recent left total knee replacement postop day 4 came to the ER for evaluation of altered mental status and dizziness.  Patient had left total knee replacement on November 08, 2017 and was discharged on oral narcotics and baclofen.  After going home patient has been having difficulty ambulating and his wife is not able to take care of him.  Also during this time, on and off he has been talking which does not make sense but otherwise he is alert oriented x4.  Denies any fevers, chills and other complaints.  In the ER patient's blood pressure was noted to be borderline low, he appeared dehydrated, and hypokalemia.  He was started on IV fluids.  CT of the head was negative.  Per the ER staff patient was on and off saying random things but overall he is alert oriented x4.  Wife does not think patient is safe to go home.  His left knee was slightly swollen and erythematous, case was discussed with orthopedic who did not think it was infected and this was expected change postop.  UA and chest x-ray was clear. He was given IV fluids with improvement in his blood pressure. He was admitted for IV fluid hydration and reevaluation by PT/OT, in the meantime I will hold off on Narcotic as I think this is making him little encephalopathic.    Review of Systems: As per HPI otherwise 10 point review of systems negative.   Past Medical History:  Diagnosis Date  . Depression   . High cholesterol   . History of broken nose   . History of pneumonia 1998  . Hx of colonoscopy 2013  . Hypertension   . Injury of pelvis 1997  . Sleep apnea     Past Surgical History:  Procedure Laterality Date  . COLONOSCOPY   05/2012   Dr. Carlisle CaterAbaqueta  . FRACTURE SURGERY    . HAND SURGERY  2000   [facility]  . JOINT REPLACEMENT    . KNEE SURGERY  12/07/1976   [facility]  . KNEE SURGERY  2007  . SKIN GRAFT  1962   [facility]   . TOTAL KNEE ARTHROPLASTY Left 11/08/2017   Procedure: LEFT TOTAL KNEE ARTHROPLASTY;  Surgeon: Ollen GrossAluisio, Frank, MD;  Location: [facility];  Service: Orthopedics;  Laterality: Left;     reports that  has never smoked. he has never used smokeless tobacco. He reports that he drinks about 0.6 oz of alcohol per week. He reports that he does not use drugs.  No Known Allergies  Family History  Problem Relation Age of Onset  . Lymphoma Mother   . Bipolar disorder Son   . Heart disease Other        multiple    Acceptable: Family history reviewed and not pertinent (If you reviewed it)  Prior to Admission medications   Medication Sig Start Date End Date Taking? Authorizing Provider  baclofen (LIORESAL) 10 MG tablet Take 10 mg 3 (three) times daily as needed by mouth for muscle spasms.   Yes [provider]  cloNIDine (CATAPRES) 0.3 MG tablet TAKE 1 TABLET(0.3 MG) BY MOUTH DAILY 10/14/17  Yes Kirt Boysarter, Monica, DO  finasteride (PROSCAR) 5 MG tablet Take 1 tablet (5 mg total) by  mouth daily. 08/16/17  Yes Montez Moritaarter, Monica, DO  gabapentin (NEURONTIN) 300 MG capsule Take 1 capsule (300 mg total) by mouth 3 (three) times daily. Gabapentin 300 mg Protocol Take a 300 mg capsule three times a day for one week, Then a 300 mg capsule twice a day for one week, Then a 300 mg capsule once a day for one week, then discontinue the Gabapentin. 11/09/17  Yes Perkins, Alexzandrew L, PA-C  methocarbamol (ROBAXIN) 500 MG tablet Take 1 tablet (500 mg total) by mouth every 6 (six) hours as needed for muscle spasms. 11/09/17  Yes Perkins, Alexzandrew L, PA-C  oxyCODONE (OXY IR/ROXICODONE) 5 MG immediate release tablet Take 1-2 tablets (5-10 mg total) by mouth every 4 (four) hours as needed for moderate pain or severe pain.  11/09/17  Yes Perkins, Alexzandrew L, PA-C  PARoxetine (PAXIL) 20 MG tablet TAKE 1 TABLET(20 MG) BY MOUTH DAILY 10/14/17  Yes Kirt Boysarter, Monica, DO  rivaroxaban (XARELTO) 10 MG TABS tablet Take 1 tablet (10 mg total) by mouth daily with breakfast. Take Xarelto for two and a half more weeks following discharge from the hospital, then discontinue Xarelto. Once the patient has completed the blood thinner regimen, then take a Baby 81 mg Aspirin daily for three more weeks. 11/10/17  Yes Perkins, Alexzandrew L, PA-C  simvastatin (ZOCOR) 40 MG tablet TAKE 1 TABLET(40 MG) BY MOUTH AT BEDTIME FOR CHOLESTEROL 10/14/17  Yes Kirt Boysarter, Monica, DO  tamsulosin (FLOMAX) 0.4 MG CAPS capsule Take 0.4 mg daily after breakfast by mouth.   Yes [provider]  triamterene-hydrochlorothiazide (DYAZIDE) 37.5-25 MG capsule TAKE 1 CAPSULE BY MOUTH DAILY 10/14/17  Yes Kirt BoysCarter, Monica, DO    Physical Exam: Vitals:   11/11/17 2000 11/11/17 2036 11/11/17 2146 11/11/17 2355  BP: 124/64 124/64 98/61 (!) 97/59  Pulse: 65 65 66 63  Resp: (!) 28 (!) 28 20 14   Temp:   97.7 F (36.5 C)   TempSrc:   Oral   SpO2: 94% 93% 94% 96%  Weight:      Height:          Constitutional: NAD, calm, comfortable Vitals:   11/11/17 2000 11/11/17 2036 11/11/17 2146 11/11/17 2355  BP: 124/64 124/64 98/61 (!) 97/59  Pulse: 65 65 66 63  Resp: (!) 28 (!) 28 20 14   Temp:   97.7 F (36.5 C)   TempSrc:   Oral   SpO2: 94% 93% 94% 96%  Weight:      Height:       Eyes: PERRL, lids and conjunctivae normal ENMT: Mucous membranes are DRY. Posterior pharynx clear of any exudate or lesions.Normal dentition.  Neck: normal, supple, no masses, no thyromegaly Respiratory: clear to auscultation bilaterally, no wheezing, no crackles. Normal respiratory effort. No accessory muscle use.  Cardiovascular: Regular rate and rhythm, no murmurs / rubs / gallops. No extremity edema. 2+ pedal pulses. No carotid bruits.  Abdomen: no tenderness, no masses  palpated. No hepatosplenomegaly. Bowel sounds positive.  Musculoskeletal: no clubbing / cyanosis. No joint deformity upper and lower extremities. Good ROM, no contractures. Normal muscle tone.  Left lower extremity knee swelling noted with erythema without any signs of infection.  Picture seen by orthopedic and thinks this is as expected on postop day 4 Skin: no rashes, lesions, ulcers. No induration Neurologic: CN 2-12 grossly intact. Sensation intact, DTR normal. Strength 5/5 in all 4.  Psychiatric: Normal judgment and insight. Alert and oriented x 3. Normal mood.     Labs on Admission: I  have personally reviewed following labs and imaging studies  CBC: Recent Labs  Lab 11/09/17 0538 11/10/17 0616 11/11/17 2014  WBC 13.2* 11.2* 8.6  NEUTROABS  --   --  6.2  HGB 12.2* 11.3* 11.3*  HCT 36.9* 35.1* 34.5*  MCV 88.5 88.2 88.0  PLT 206 207 233   Basic Metabolic Panel: Recent Labs  Lab 11/09/17 0538 11/10/17 0616 11/11/17 2014  NA 137 137 135  K 3.5 3.1* 3.0*  CL 104 101 97*  CO2 28 29 30   GLUCOSE 120* 104* 140*  BUN 12 10 16   CREATININE 0.75 0.73 0.97  CALCIUM 8.7* 8.7* 8.5*   GFR: Estimated Creatinine Clearance: 121.5 mL/min (by C-G formula based on SCr of 0.97 mg/dL). Liver Function Tests: Recent Labs  Lab 11/11/17 2014  AST 15  ALT 14*  ALKPHOS 62  BILITOT 0.9  PROT 6.1*  ALBUMIN 2.9*   No results for input(s): LIPASE, AMYLASE in the last 168 hours. No results for input(s): AMMONIA in the last 168 hours. Coagulation Profile: No results for input(s): INR, PROTIME in the last 168 hours. Cardiac Enzymes: No results for input(s): CKTOTAL, CKMB, CKMBINDEX, TROPONINI in the last 168 hours. BNP (last 3 results) No results for input(s): PROBNP in the last 8760 hours. HbA1C: No results for input(s): HGBA1C in the last 72 hours. CBG: No results for input(s): GLUCAP in the last 168 hours. Lipid Profile: No results for input(s): CHOL, HDL, LDLCALC, TRIG, CHOLHDL,  LDLDIRECT in the last 72 hours. Thyroid Function Tests: No results for input(s): TSH, T4TOTAL, FREET4, T3FREE, THYROIDAB in the last 72 hours. Anemia Panel: No results for input(s): VITAMINB12, FOLATE, FERRITIN, TIBC, IRON, RETICCTPCT in the last 72 hours. Urine analysis:    Component Value Date/Time   COLORURINE YELLOW 11/11/2017 1929   APPEARANCEUR CLEAR 11/11/2017 1929   LABSPEC 1.011 11/11/2017 1929   PHURINE 7.0 11/11/2017 1929   GLUCOSEU NEGATIVE 11/11/2017 1929   HGBUR NEGATIVE 11/11/2017 1929   BILIRUBINUR NEGATIVE 11/11/2017 1929   KETONESUR 5 (A) 11/11/2017 1929   PROTEINUR NEGATIVE 11/11/2017 1929   NITRITE NEGATIVE 11/11/2017 1929   LEUKOCYTESUR NEGATIVE 11/11/2017 1929   Sepsis Labs: !!!!!!!!!!!!!!!!!!!!!!!!!!!!!!!!!!!!!!!!!!!! @LABRCNTIP (procalcitonin:4,lacticidven:4) ) Recent Results (from the past 240 hour(s))  Surgical pcr screen     Status: None   Collection Time: 11/03/17  9:15 AM  Result Value Ref Range Status   MRSA, PCR NEGATIVE NEGATIVE Final   Staphylococcus aureus NEGATIVE NEGATIVE Final    Comment: (NOTE) The Xpert SA Assay (FDA approved for NASAL specimens in patients 70 years of age and older), is one component of a comprehensive surveillance program. It is not intended to diagnose infection nor to guide or monitor treatment.      Radiological Exams on Admission: Dg Chest 2 View  Result Date: 11/11/2017 CLINICAL DATA:  Postoperative fever EXAM: CHEST  2 VIEW COMPARISON:  06/24/2016 FINDINGS: Cardiac shadow is stable. The lungs are well aerated bilaterally. No focal infiltrate or sizable effusion is seen. No acute bony abnormality is noted. IMPRESSION: No active cardiopulmonary disease. Electronically Signed   By: Alcide Clever M.D.   On: 11/11/2017 21:36   Ct Head Wo Contrast  Result Date: 11/11/2017 CLINICAL DATA:  Altered level of consciousness after taking oxycodone at 16:00 post left knee surgery on Monday. Dizziness and hallucinations. EXAM:  CT HEAD WITHOUT CONTRAST TECHNIQUE: Contiguous axial images were obtained from the base of the skull through the vertex without intravenous contrast. COMPARISON:  None. FINDINGS: Brain: No evidence of acute  infarction, hemorrhage, hydrocephalus, extra-axial collection or mass lesion/mass effect. Normal variant cavum septi pellucidi and vergae. Vascular: No hyperdense vessel or unexpected calcification. Skull: Normal. Negative for fracture or focal lesion. Sinuses/Orbits: No acute finding. Partially imaged soft tissue density in the medial left maxillary sinus may represent a mucous retention cyst or polyp. Other: None IMPRESSION: No acute intracranial abnormality. Electronically Signed   By: Tollie Eth M.D.   On: 11/11/2017 23:30    EKG: Independently reviewed.   Assessment/Plan Active Problems:   Ambulatory dysfunction    Ambulatory dysfunction Postop day 4 left total knee arthroplasty - Admit for observation, according to the wife and patient he is unsafe to go home due to his recent left knee surgery -We will get PT/OT again in the morning -Bedrest tonight -Was discharged on Xarelto for DVT prophylaxis, will do lovenox for now in the hospital. LE picture seen by Orthopedic on call, who doesn't think his knee is infection and this appears to be as normal appearing as expected at POD 4. No leukocytosis/fever therefore will hold off on Abx.  -Pain control with Tylenol, tramadol and IV ketorolac.  Hold off on baclofen and other narcotics.  Mild metabolic encephalopathy - No signs of active infection.  This is likely secondary to narcotic use as he is narcotic nave -Minimize narcotic use, he is currently alert awake oriented x4.  CT of the head negative in the ER - Supportive care  Mild to moderate dehydration Hypotension - We will provide aggressive IV fluid hydration, monitor urine output -Improved with fluids, hold off clonidine for now  BPH -On Flomax and finasteride  Peripheral  neuropathy -On gabapentin  Obstructive sleep apnea -He can use his home CPAP  Hyperlipidemia -Continue simvastatin 20   DVT prophylaxis: Lovenox Code Status: Full code Family Communication: Wife at bedside Disposition Plan: Likely discharge in 24 hours Consults called: None Admission status: Obs   Kuuipo Anzaldo Joline Maxcy MD [facility]   If 7PM-7AM, please contact night-coverage [email or web address] Password [email or web address]  11/12/2017, 12:05 AM

## 2017-11-12 NOTE — Progress Notes (Signed)
OT Cancellation Note  Patient Details Name: Jason Ingram MRN: [id number] DOB: Nov 10, 1956   Cancelled Treatment:     Spoke with PT - no OT needs. Lise AuerLori Gracin [place], [place] [phone]  Einar CrowEDDING, Kylon Philbrook D 11/12/2017, 10:06 AM

## 2017-11-18 DIAGNOSIS — G4733 Obstructive sleep apnea (adult) (pediatric): Secondary | ICD-10-CM | POA: Diagnosis not present

## 2017-11-18 DIAGNOSIS — M25562 Pain in left knee: Secondary | ICD-10-CM | POA: Diagnosis not present

## 2017-11-22 DIAGNOSIS — M25562 Pain in left knee: Secondary | ICD-10-CM | POA: Diagnosis not present

## 2017-11-24 DIAGNOSIS — M25562 Pain in left knee: Secondary | ICD-10-CM | POA: Diagnosis not present

## 2017-11-26 DIAGNOSIS — M25562 Pain in left knee: Secondary | ICD-10-CM | POA: Diagnosis not present

## 2017-12-01 DIAGNOSIS — M25562 Pain in left knee: Secondary | ICD-10-CM | POA: Diagnosis not present

## 2017-12-03 DIAGNOSIS — M25562 Pain in left knee: Secondary | ICD-10-CM | POA: Diagnosis not present

## 2017-12-06 DIAGNOSIS — G4733 Obstructive sleep apnea (adult) (pediatric): Secondary | ICD-10-CM | POA: Diagnosis not present

## 2017-12-10 DIAGNOSIS — M25562 Pain in left knee: Secondary | ICD-10-CM | POA: Diagnosis not present

## 2017-12-14 DIAGNOSIS — Z96659 Presence of unspecified artificial knee joint: Secondary | ICD-10-CM | POA: Diagnosis not present

## 2017-12-14 DIAGNOSIS — H35033 Hypertensive retinopathy, bilateral: Secondary | ICD-10-CM | POA: Diagnosis not present

## 2017-12-16 DIAGNOSIS — M25562 Pain in left knee: Secondary | ICD-10-CM | POA: Diagnosis not present

## 2017-12-16 DIAGNOSIS — F339 Major depressive disorder, recurrent, unspecified: Secondary | ICD-10-CM | POA: Diagnosis not present

## 2017-12-20 DIAGNOSIS — M25562 Pain in left knee: Secondary | ICD-10-CM | POA: Diagnosis not present

## 2017-12-21 DIAGNOSIS — F339 Major depressive disorder, recurrent, unspecified: Secondary | ICD-10-CM | POA: Diagnosis not present

## 2017-12-23 DIAGNOSIS — M25562 Pain in left knee: Secondary | ICD-10-CM | POA: Diagnosis not present

## 2017-12-28 DIAGNOSIS — M25562 Pain in left knee: Secondary | ICD-10-CM | POA: Diagnosis not present

## 2017-12-30 DIAGNOSIS — M25562 Pain in left knee: Secondary | ICD-10-CM | POA: Diagnosis not present

## 2018-01-03 DIAGNOSIS — J019 Acute sinusitis, unspecified: Secondary | ICD-10-CM | POA: Diagnosis not present

## 2018-01-04 DIAGNOSIS — F339 Major depressive disorder, recurrent, unspecified: Secondary | ICD-10-CM | POA: Diagnosis not present

## 2018-01-05 DIAGNOSIS — M25562 Pain in left knee: Secondary | ICD-10-CM | POA: Diagnosis not present

## 2018-01-13 DIAGNOSIS — F339 Major depressive disorder, recurrent, unspecified: Secondary | ICD-10-CM | POA: Diagnosis not present

## 2018-02-01 DIAGNOSIS — F339 Major depressive disorder, recurrent, unspecified: Secondary | ICD-10-CM | POA: Diagnosis not present

## 2018-02-15 ENCOUNTER — Other Ambulatory Visit: Payer: Self-pay | Admitting: Internal Medicine

## 2018-02-25 ENCOUNTER — Other Ambulatory Visit: Payer: Self-pay | Admitting: Internal Medicine

## 2018-03-15 DIAGNOSIS — G4733 Obstructive sleep apnea (adult) (pediatric): Secondary | ICD-10-CM | POA: Diagnosis not present

## 2018-03-18 ENCOUNTER — Other Ambulatory Visit: Payer: Self-pay | Admitting: Internal Medicine

## 2018-05-01 DIAGNOSIS — M549 Dorsalgia, unspecified: Secondary | ICD-10-CM | POA: Diagnosis not present

## 2018-05-13 DIAGNOSIS — R05 Cough: Secondary | ICD-10-CM | POA: Diagnosis not present

## 2018-05-26 DIAGNOSIS — R030 Elevated blood-pressure reading, without diagnosis of hypertension: Secondary | ICD-10-CM | POA: Diagnosis not present

## 2018-05-26 DIAGNOSIS — H6990 Unspecified Eustachian tube disorder, unspecified ear: Secondary | ICD-10-CM | POA: Diagnosis not present

## 2018-06-13 DIAGNOSIS — J00 Acute nasopharyngitis [common cold]: Secondary | ICD-10-CM | POA: Diagnosis not present

## 2018-06-17 ENCOUNTER — Other Ambulatory Visit: Payer: Self-pay | Admitting: Internal Medicine

## 2018-06-28 ENCOUNTER — Other Ambulatory Visit: Payer: Self-pay | Admitting: Internal Medicine

## 2018-06-30 DIAGNOSIS — G4733 Obstructive sleep apnea (adult) (pediatric): Secondary | ICD-10-CM | POA: Diagnosis not present

## 2018-07-27 ENCOUNTER — Encounter: Payer: Self-pay | Admitting: Internal Medicine

## 2018-09-22 DIAGNOSIS — M25562 Pain in left knee: Secondary | ICD-10-CM | POA: Diagnosis not present

## 2018-09-22 DIAGNOSIS — M25561 Pain in right knee: Secondary | ICD-10-CM | POA: Diagnosis not present

## 2018-09-28 DIAGNOSIS — G4733 Obstructive sleep apnea (adult) (pediatric): Secondary | ICD-10-CM | POA: Diagnosis not present

## 2018-10-10 ENCOUNTER — Other Ambulatory Visit: Payer: Self-pay | Admitting: *Deleted

## 2018-10-10 DIAGNOSIS — Z125 Encounter for screening for malignant neoplasm of prostate: Secondary | ICD-10-CM | POA: Diagnosis not present

## 2018-10-10 MED ORDER — SIMVASTATIN 40 MG PO TABS: ORAL_TABLET | ORAL | 0 refills | Status: DC

## 2018-10-10 MED ORDER — PAROXETINE HCL 20 MG PO TABS: ORAL_TABLET | ORAL | 0 refills | Status: DC

## 2018-10-10 NOTE — Telephone Encounter (Signed)
[facility]  Patient needs an appointment before anymore future refills.  Tried calling patient. NA. Message sent on script.

## 2018-10-17 DIAGNOSIS — R350 Frequency of micturition: Secondary | ICD-10-CM | POA: Diagnosis not present

## 2018-10-17 DIAGNOSIS — E291 Testicular hypofunction: Secondary | ICD-10-CM | POA: Diagnosis not present

## 2018-10-27 DIAGNOSIS — M722 Plantar fascial fibromatosis: Secondary | ICD-10-CM | POA: Diagnosis not present

## 2018-10-27 DIAGNOSIS — M170 Bilateral primary osteoarthritis of knee: Secondary | ICD-10-CM | POA: Diagnosis not present

## 2018-11-08 ENCOUNTER — Other Ambulatory Visit: Payer: Self-pay | Admitting: Nurse Practitioner

## 2018-11-09 NOTE — Telephone Encounter (Signed)
Left message on voicemail for patient to return call when available . Reason for call: I need to confirm [facility] and if so we need to schedule yearly with Shanda BumpsJessica.

## 2018-11-10 NOTE — Telephone Encounter (Signed)
Spoke with patient, [place] in [place] and plans to establish with a provider there. Patient would like to know if Shanda BumpsJessica will approve 30 day supply to allow time to establish new provider

## 2018-11-10 NOTE — Telephone Encounter (Signed)
Yes this is okay 

## 2018-11-14 DIAGNOSIS — I10 Essential (primary) hypertension: Secondary | ICD-10-CM | POA: Diagnosis not present

## 2018-11-14 DIAGNOSIS — Z6841 Body Mass Index (BMI) 40.0 and over, adult: Secondary | ICD-10-CM | POA: Diagnosis not present

## 2018-11-14 DIAGNOSIS — F329 Major depressive disorder, single episode, unspecified: Secondary | ICD-10-CM | POA: Diagnosis not present

## 2018-12-08 ENCOUNTER — Telehealth: Payer: Self-pay

## 2018-12-08 ENCOUNTER — Other Ambulatory Visit: Payer: Self-pay | Admitting: Nurse Practitioner

## 2018-12-08 NOTE — Telephone Encounter (Signed)
Called and spoke with patient to verify he would still be returning to [facility]. [facility] had received a refill request for Simvastatin. Patient is no longer under the care of one of our provider's that had left our office. Patient stated that he had moved to a different provider and was no longer interested in seeing someone at [facility]. Denied medication refill.

## 2018-12-26 DIAGNOSIS — R351 Nocturia: Secondary | ICD-10-CM | POA: Diagnosis not present

## 2018-12-26 DIAGNOSIS — I10 Essential (primary) hypertension: Secondary | ICD-10-CM | POA: Diagnosis not present

## 2018-12-26 DIAGNOSIS — E785 Hyperlipidemia, unspecified: Secondary | ICD-10-CM | POA: Diagnosis not present

## 2018-12-28 DIAGNOSIS — H35033 Hypertensive retinopathy, bilateral: Secondary | ICD-10-CM | POA: Diagnosis not present

## 2018-12-29 DIAGNOSIS — M25561 Pain in right knee: Secondary | ICD-10-CM | POA: Diagnosis not present

## 2018-12-29 DIAGNOSIS — M1711 Unilateral primary osteoarthritis, right knee: Secondary | ICD-10-CM | POA: Diagnosis not present

## 2019-01-04 DIAGNOSIS — I10 Essential (primary) hypertension: Secondary | ICD-10-CM | POA: Diagnosis not present

## 2019-01-04 DIAGNOSIS — R351 Nocturia: Secondary | ICD-10-CM | POA: Diagnosis not present

## 2019-01-04 DIAGNOSIS — Z6841 Body Mass Index (BMI) 40.0 and over, adult: Secondary | ICD-10-CM | POA: Diagnosis not present

## 2019-01-04 DIAGNOSIS — E785 Hyperlipidemia, unspecified: Secondary | ICD-10-CM | POA: Diagnosis not present

## 2019-01-08 ENCOUNTER — Other Ambulatory Visit: Payer: Self-pay | Admitting: Nurse Practitioner

## 2019-01-17 ENCOUNTER — Ambulatory Visit: Payer: BLUE CROSS/BLUE SHIELD | Admitting: Psychiatry

## 2019-01-17 DIAGNOSIS — F329 Major depressive disorder, single episode, unspecified: Secondary | ICD-10-CM

## 2019-01-17 DIAGNOSIS — F32A Depression, unspecified: Secondary | ICD-10-CM

## 2019-01-17 MED ORDER — PAROXETINE HCL 20 MG PO TABS: ORAL_TABLET | ORAL | 1 refills | Status: DC

## 2019-01-17 NOTE — Progress Notes (Signed)
[facility]  Patient ID: Felipa EthGeorge L Briner,  MRN: [id number]  PCP: Patient, No Pcp Per  Date of Evaluation: 01/17/2019 Time spent:20 minutes  Chief Complaint:   HISTORY/CURRENT STATUS: HPI patient seen 1 year ago.  Currently he is doing well.  He has noticed weight gain up to 65 pounds over the last year but is now losing weight and has already lost 16 lbs. Currently he is doing well.  Individual Medical History/ Review of Systems: Changes? :No   Allergies: Patient has no known allergies.  Current Medications:  Current Outpatient Medications:  .  baclofen (LIORESAL) 10 MG tablet, Take 10 mg 3 (three) times daily as needed by mouth for muscle spasms., Disp: , Rfl:  .  cloNIDine (CATAPRES) 0.3 MG tablet, TAKE 1 TABLET(0.3 MG) BY MOUTH DAILY, Disp: 90 tablet, Rfl: 1 .  finasteride (PROSCAR) 5 MG tablet, Take 1 tablet (5 mg total) by mouth daily., Disp: 30 tablet, Rfl: 1 .  gabapentin (NEURONTIN) 300 MG capsule, Take 1 capsule (300 mg total) by mouth 3 (three) times daily. Gabapentin 300 mg Protocol Take a 300 mg capsule three times a day for one week, Then a 300 mg capsule twice a day for one week, Then a 300 mg capsule once a day for one week, then discontinue the Gabapentin., Disp: 42 capsule, Rfl: 0 .  methocarbamol (ROBAXIN) 500 MG tablet, Take 1 tablet (500 mg total) by mouth every 6 (six) hours as needed for muscle spasms., Disp: 80 tablet, Rfl: 0 .  oxyCODONE (OXY IR/ROXICODONE) 5 MG immediate release tablet, Take 1 tablet (5 mg total) by mouth every 6 (six) hours as needed for moderate pain or severe pain., Disp: 80 tablet, Rfl: 0 .  PARoxetine (PAXIL) 20 MG tablet, TAKE 1 TABLET BY MOUTH EVERY DAY, Disp: 30 tablet, Rfl: 0 .  rivaroxaban (XARELTO) 10 MG TABS tablet, Take 1 tablet (10 mg total) by mouth daily with breakfast. Take Xarelto for two and a half more weeks following discharge from the hospital, then discontinue Xarelto. Once the patient has completed the blood  thinner regimen, then take a Baby 81 mg Aspirin daily for three more weeks., Disp: 19 tablet, Rfl: 0 .  simvastatin (ZOCOR) 40 MG tablet, TAKE 1 TABLET BY MOUTH AT BEDTIME FOR CHOLESTEROL, Disp: 30 tablet, Rfl: 0 .  tamsulosin (FLOMAX) 0.4 MG CAPS capsule, Take 0.4 mg daily after breakfast by mouth., Disp: , Rfl:  .  traMADol (ULTRAM) 50 MG tablet, Take 1 tablet (50 mg total) by mouth every 6 (six) hours as needed for moderate pain., Disp: 20 tablet, Rfl: 0 .  triamterene-hydrochlorothiazide (DYAZIDE) 37.5-25 MG capsule, TAKE 1 CAPSULE BY MOUTH DAILY, Disp: 90 capsule, Rfl: 1 Medication Side Effects: none  Family Medical/ Social History: Changes?no  MENTAL HEALTH EXAM:  There were no vitals taken for this visit.There is no height or weight on file to calculate BMI.  General Appearance: Casual  Eye Contact:  Good  Speech:  Clear and Coherent  Volume:  Normal  Mood:  Euthymic  Affect:  Appropriate  Thought Process:  Linear  Orientation:  Full (Time, Place, and Person)  Thought Content: Logical   Suicidal Thoughts:  No  Homicidal Thoughts:  No  Memory:  WNL  Judgement:  Good  Insight:  Good  Psychomotor Activity:  Normal  Concentration:  Concentration: Good  Recall:  Good  Fund of Knowledge: Good  Language: Good  Assets:  Desire for Improvement  ADL's:  Intact  Cognition: WNL  Prognosis:  Good    DIAGNOSES: No diagnosis found.  Receiving Psychotherapy: No    RECOMMENDATIONS: Patient is doing well.  He will continue Paxil 20 mg a day.  Has sleep apnea for which she is on CPAP for. I will see him again in 3 months.   Anne Fu, PA-C

## 2019-02-24 DIAGNOSIS — I10 Essential (primary) hypertension: Secondary | ICD-10-CM | POA: Diagnosis not present

## 2019-02-24 DIAGNOSIS — Z6841 Body Mass Index (BMI) 40.0 and over, adult: Secondary | ICD-10-CM | POA: Diagnosis not present

## 2019-03-09 ENCOUNTER — Other Ambulatory Visit: Payer: Self-pay | Admitting: Psychiatry

## 2019-04-12 DIAGNOSIS — E785 Hyperlipidemia, unspecified: Secondary | ICD-10-CM | POA: Diagnosis not present

## 2019-04-12 DIAGNOSIS — Z6838 Body mass index (BMI) 38.0-38.9, adult: Secondary | ICD-10-CM | POA: Diagnosis not present

## 2019-04-12 DIAGNOSIS — I10 Essential (primary) hypertension: Secondary | ICD-10-CM | POA: Diagnosis not present

## 2019-04-12 DIAGNOSIS — F329 Major depressive disorder, single episode, unspecified: Secondary | ICD-10-CM | POA: Diagnosis not present

## 2019-04-12 DIAGNOSIS — E669 Obesity, unspecified: Secondary | ICD-10-CM | POA: Diagnosis not present

## 2019-04-18 ENCOUNTER — Ambulatory Visit: Payer: BLUE CROSS/BLUE SHIELD | Admitting: Psychiatry

## 2019-05-07 DIAGNOSIS — Z20828 Contact with and (suspected) exposure to other viral communicable diseases: Secondary | ICD-10-CM | POA: Diagnosis not present

## 2019-05-09 DIAGNOSIS — I10 Essential (primary) hypertension: Secondary | ICD-10-CM | POA: Diagnosis not present

## 2019-05-09 DIAGNOSIS — E785 Hyperlipidemia, unspecified: Secondary | ICD-10-CM | POA: Diagnosis not present

## 2019-06-27 DIAGNOSIS — R21 Rash and other nonspecific skin eruption: Secondary | ICD-10-CM | POA: Diagnosis not present

## 2019-06-27 DIAGNOSIS — M792 Neuralgia and neuritis, unspecified: Secondary | ICD-10-CM | POA: Diagnosis not present

## 2019-06-27 DIAGNOSIS — M62838 Other muscle spasm: Secondary | ICD-10-CM | POA: Diagnosis not present

## 2019-06-29 DIAGNOSIS — M62838 Other muscle spasm: Secondary | ICD-10-CM | POA: Diagnosis not present

## 2019-06-29 DIAGNOSIS — M25521 Pain in right elbow: Secondary | ICD-10-CM | POA: Diagnosis not present

## 2019-06-29 DIAGNOSIS — M5030 Other cervical disc degeneration, unspecified cervical region: Secondary | ICD-10-CM | POA: Diagnosis not present

## 2019-06-29 DIAGNOSIS — M542 Cervicalgia: Secondary | ICD-10-CM | POA: Diagnosis not present

## 2019-07-03 DIAGNOSIS — M62838 Other muscle spasm: Secondary | ICD-10-CM | POA: Diagnosis not present

## 2019-07-03 DIAGNOSIS — M5030 Other cervical disc degeneration, unspecified cervical region: Secondary | ICD-10-CM | POA: Diagnosis not present

## 2019-07-03 DIAGNOSIS — M542 Cervicalgia: Secondary | ICD-10-CM | POA: Diagnosis not present

## 2019-07-03 DIAGNOSIS — M546 Pain in thoracic spine: Secondary | ICD-10-CM | POA: Diagnosis not present

## 2019-07-18 DIAGNOSIS — M5090 Cervical disc disorder, unspecified, unspecified cervical region: Secondary | ICD-10-CM | POA: Diagnosis not present

## 2019-07-18 DIAGNOSIS — M47812 Spondylosis without myelopathy or radiculopathy, cervical region: Secondary | ICD-10-CM | POA: Diagnosis not present

## 2019-07-18 DIAGNOSIS — M5031 Other cervical disc degeneration,  high cervical region: Secondary | ICD-10-CM | POA: Diagnosis not present

## 2019-07-18 DIAGNOSIS — I10 Essential (primary) hypertension: Secondary | ICD-10-CM | POA: Diagnosis not present

## 2019-07-18 DIAGNOSIS — E669 Obesity, unspecified: Secondary | ICD-10-CM | POA: Diagnosis not present

## 2019-07-18 DIAGNOSIS — R200 Anesthesia of skin: Secondary | ICD-10-CM | POA: Diagnosis not present

## 2019-07-18 DIAGNOSIS — Z6830 Body mass index (BMI) 30.0-30.9, adult: Secondary | ICD-10-CM | POA: Diagnosis not present

## 2019-07-19 ENCOUNTER — Other Ambulatory Visit: Payer: Self-pay

## 2019-07-19 MED ORDER — PAROXETINE HCL 20 MG PO TABS: 20.0000 mg | ORAL_TABLET | Freq: Every day | ORAL | 0 refills | Status: DC

## 2019-09-01 DIAGNOSIS — Z113 Encounter for screening for infections with a predominantly sexual mode of transmission: Secondary | ICD-10-CM | POA: Diagnosis not present

## 2019-09-28 ENCOUNTER — Telehealth: Payer: Self-pay | Admitting: Psychiatry

## 2019-09-28 NOTE — Telephone Encounter (Signed)
Noted, thank you and good suggestion

## 2019-09-28 NOTE — Telephone Encounter (Signed)
Pt left VM stating he need to speak with someone. He hasn't been seen since 2/11 pt of [facility].

## 2019-09-28 NOTE — Telephone Encounter (Signed)
Spoke with pt. He said he has a lot of things going on right now. He actually would like to see one of the therapists so I transferred him up front to make an appt. I suggested also making an appt with a provider as well. He has not been on meds in 4-5 months.

## 2019-10-04 IMAGING — CT CT HEAD W/O CM
3 series · 14 of 47 positions shown, 16 images · non-contrast
Comparison: None.

CLINICAL DATA: Altered level of consciousness after taking
oxycodone at [DATE] post left knee surgery on [REDACTED]. Dizziness and
hallucinations.

EXAM:
CT HEAD WITHOUT CONTRAST
TECHNIQUE: Contiguous axial images were obtained from the base of the skull
through the vertex without intravenous contrast.

[Series 2: head wo · axial · 0.50mm/px · z∈[+83,+208]mm · 8 of 31 slices shown, 10 images]
[im 3/31  brain]
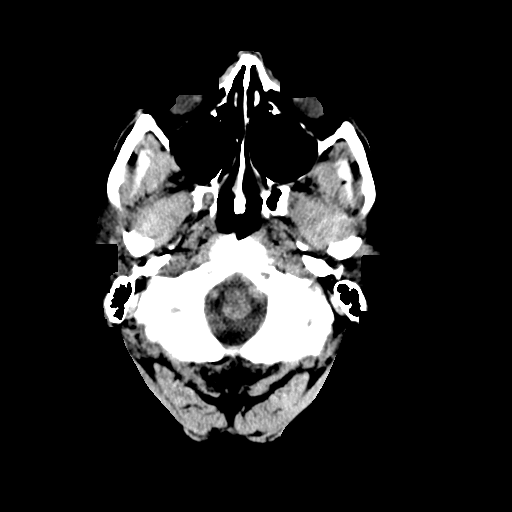
[im 3/31  bone]
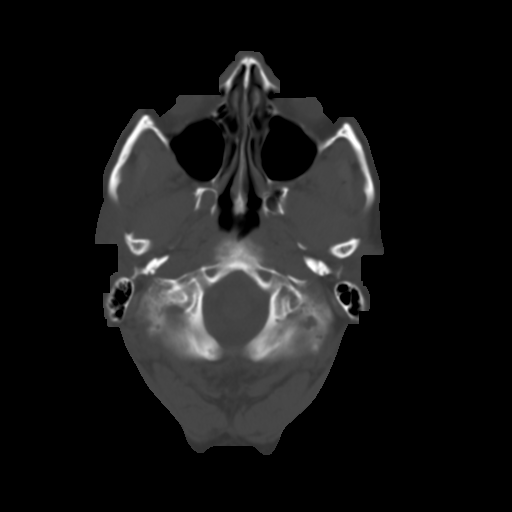
[im 7/31  brain]
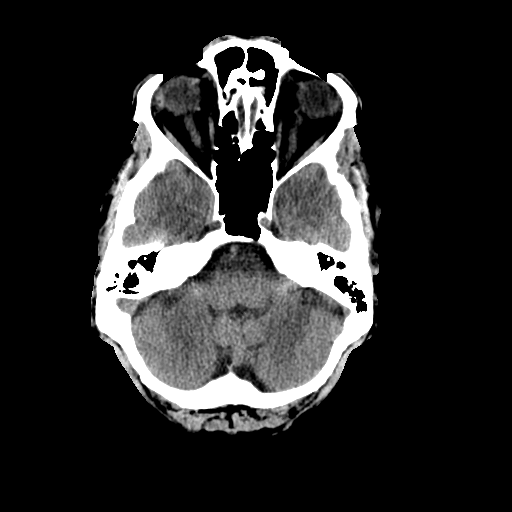
[im 10/31  brain]
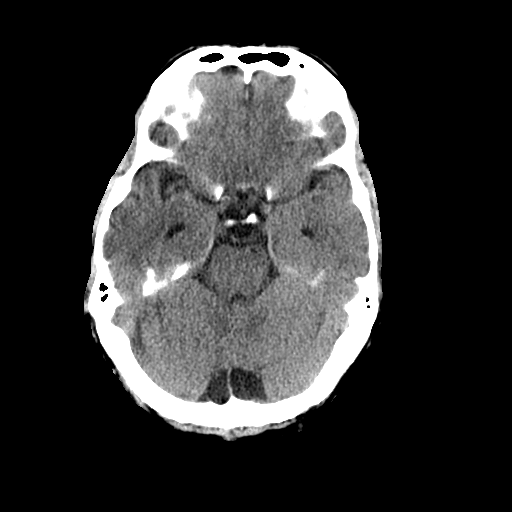
[im 14/31  brain]
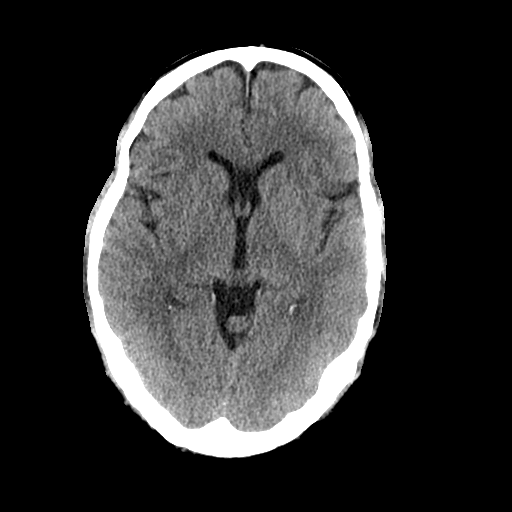
[im 17/31  brain]
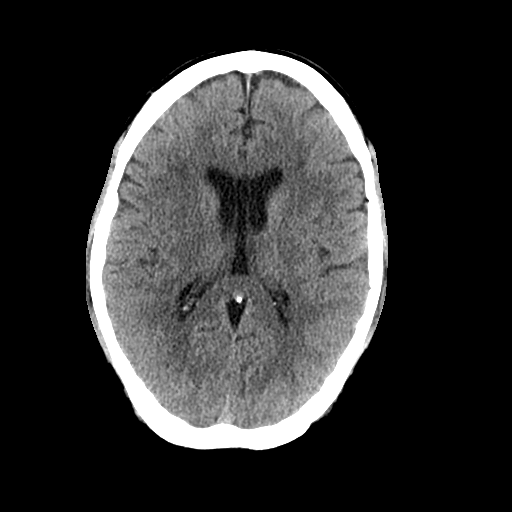
[im 17/31  bone]
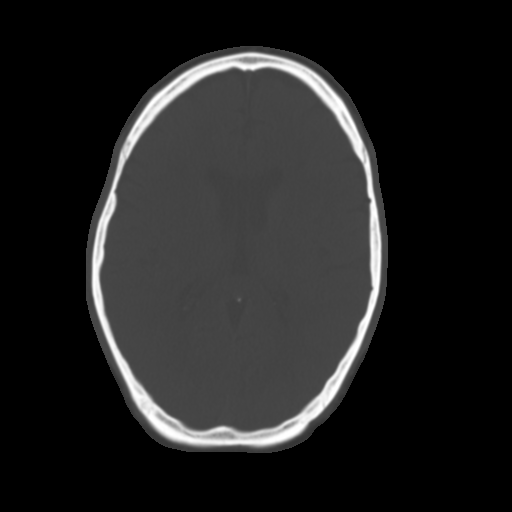
[im 21/31  brain]
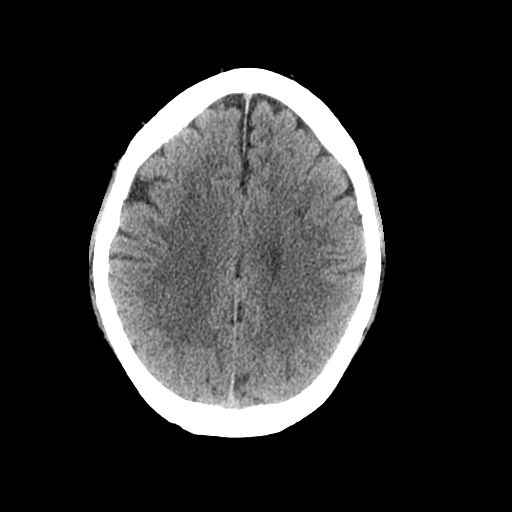
[im 24/31  brain]
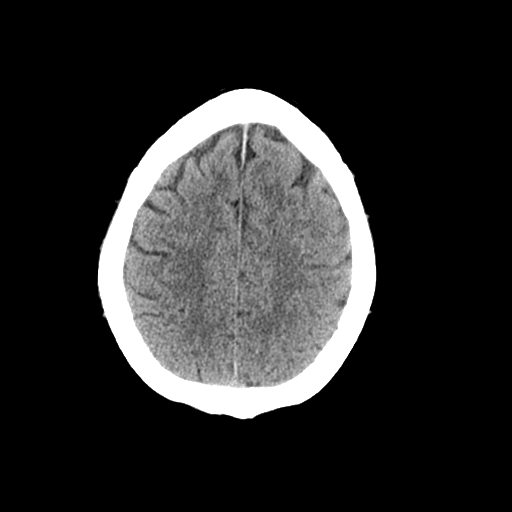
[im 28/31  brain]
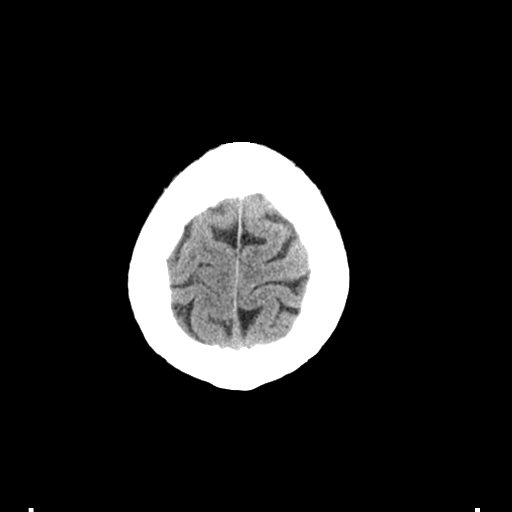

[Series 4: coronal soft tissue · coronal · 0.32mm/px · 3 of 81 slices shown]
[im 27/81  brain]
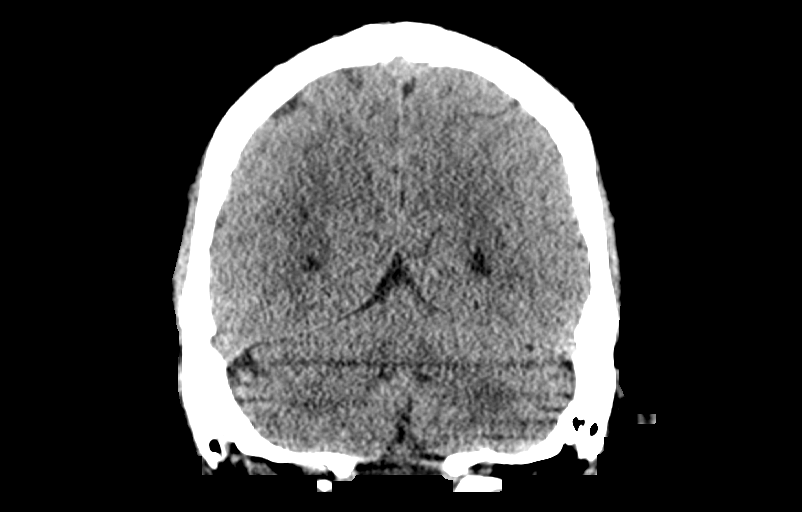
[im 36/81  brain]
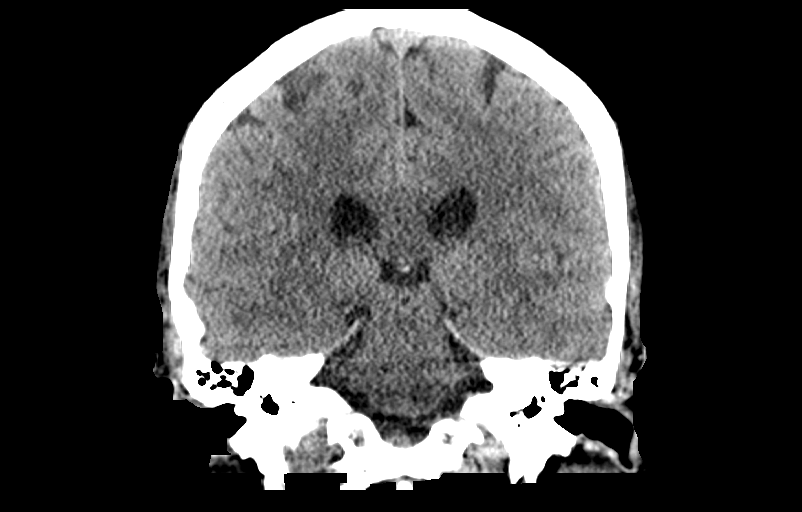
[im 45/81  brain]
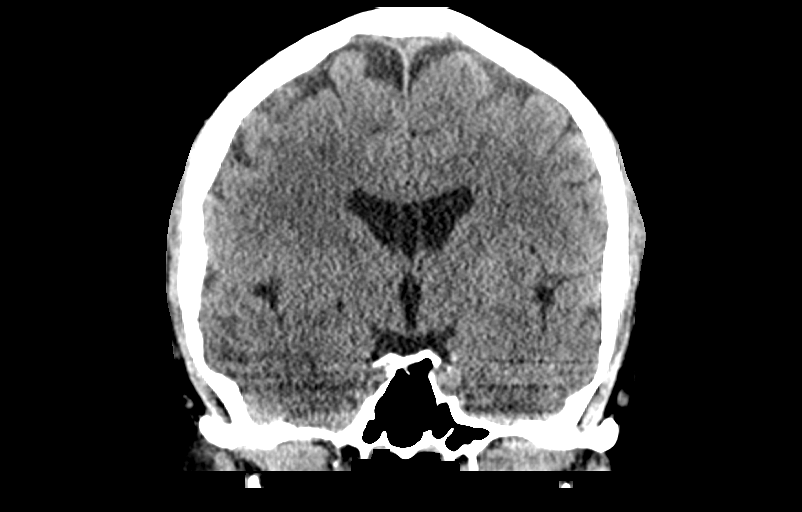

[Series 5: sagittal soft tissue · sagittal · 0.35mm/px · 3 of 84 slices shown]
[im 28/84  brain]
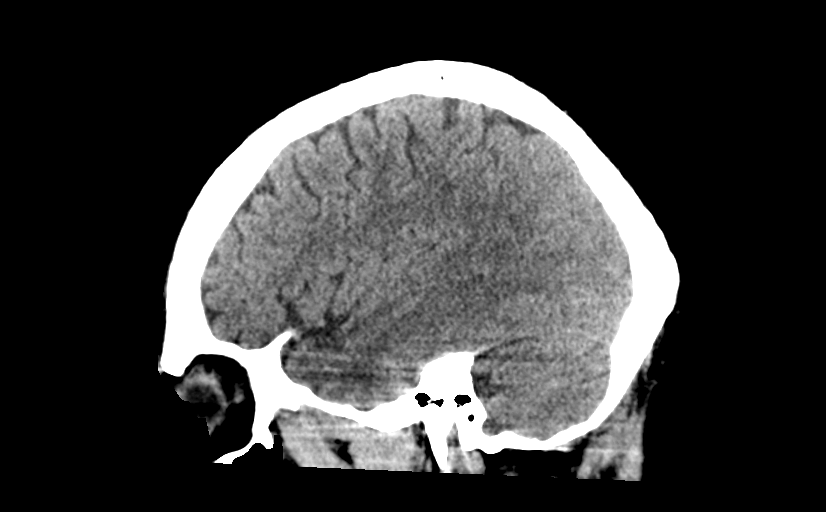
[im 42/84  brain]
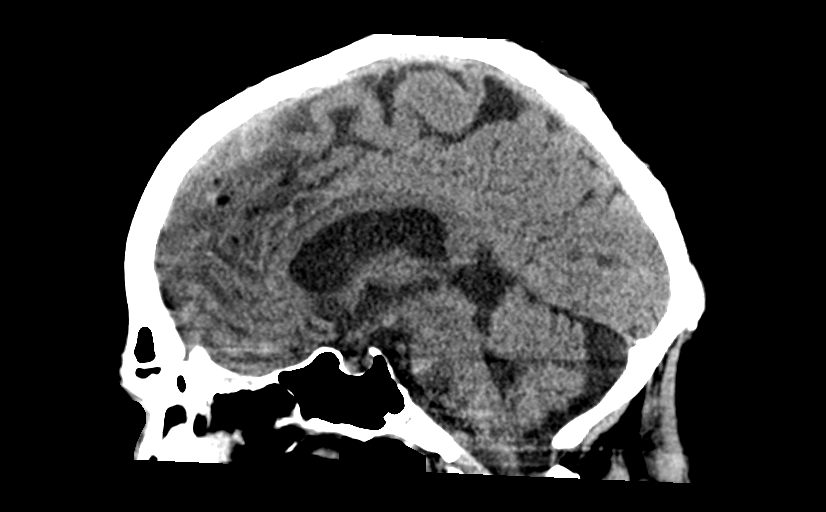
[im 56/84  brain]
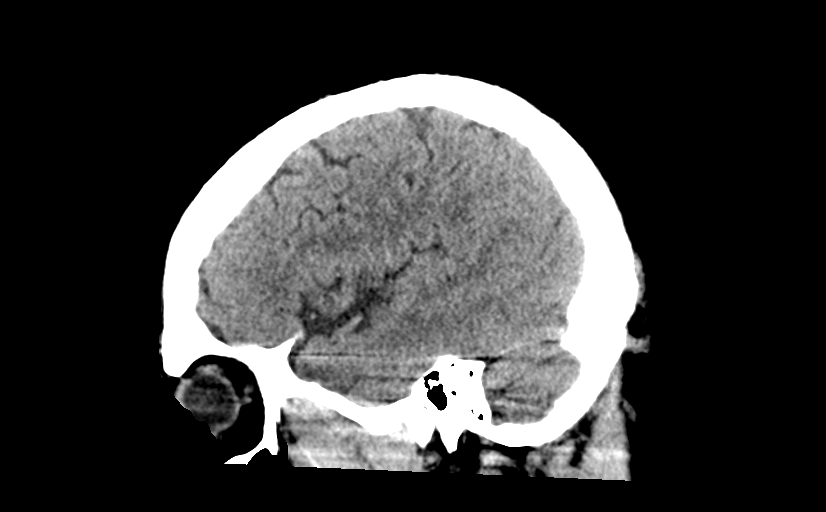

[14 of 47 positions shown; findings below may reference images not displayed]

FINDINGS: Brain: No evidence of acute infarction, hemorrhage, hydrocephalus,
extra-axial collection or mass lesion/mass effect. Normal variant
cavum septi pellucidi and vergae.

Vascular: No hyperdense vessel or unexpected calcification.

Skull: Normal. Negative for fracture or focal lesion.

Sinuses/Orbits: No acute finding. Partially imaged soft tissue
density in the medial left maxillary sinus may represent a mucous
retention cyst or polyp.

Other: None
IMPRESSION: No acute intracranial abnormality.

## 2019-10-04 IMAGING — CR DG CHEST 2V
2 series · 2 of 2 positions shown · non-contrast
Comparison: 06/24/2016

CLINICAL DATA: Postoperative fever

EXAM:
CHEST  2 VIEW

[w chest lat]
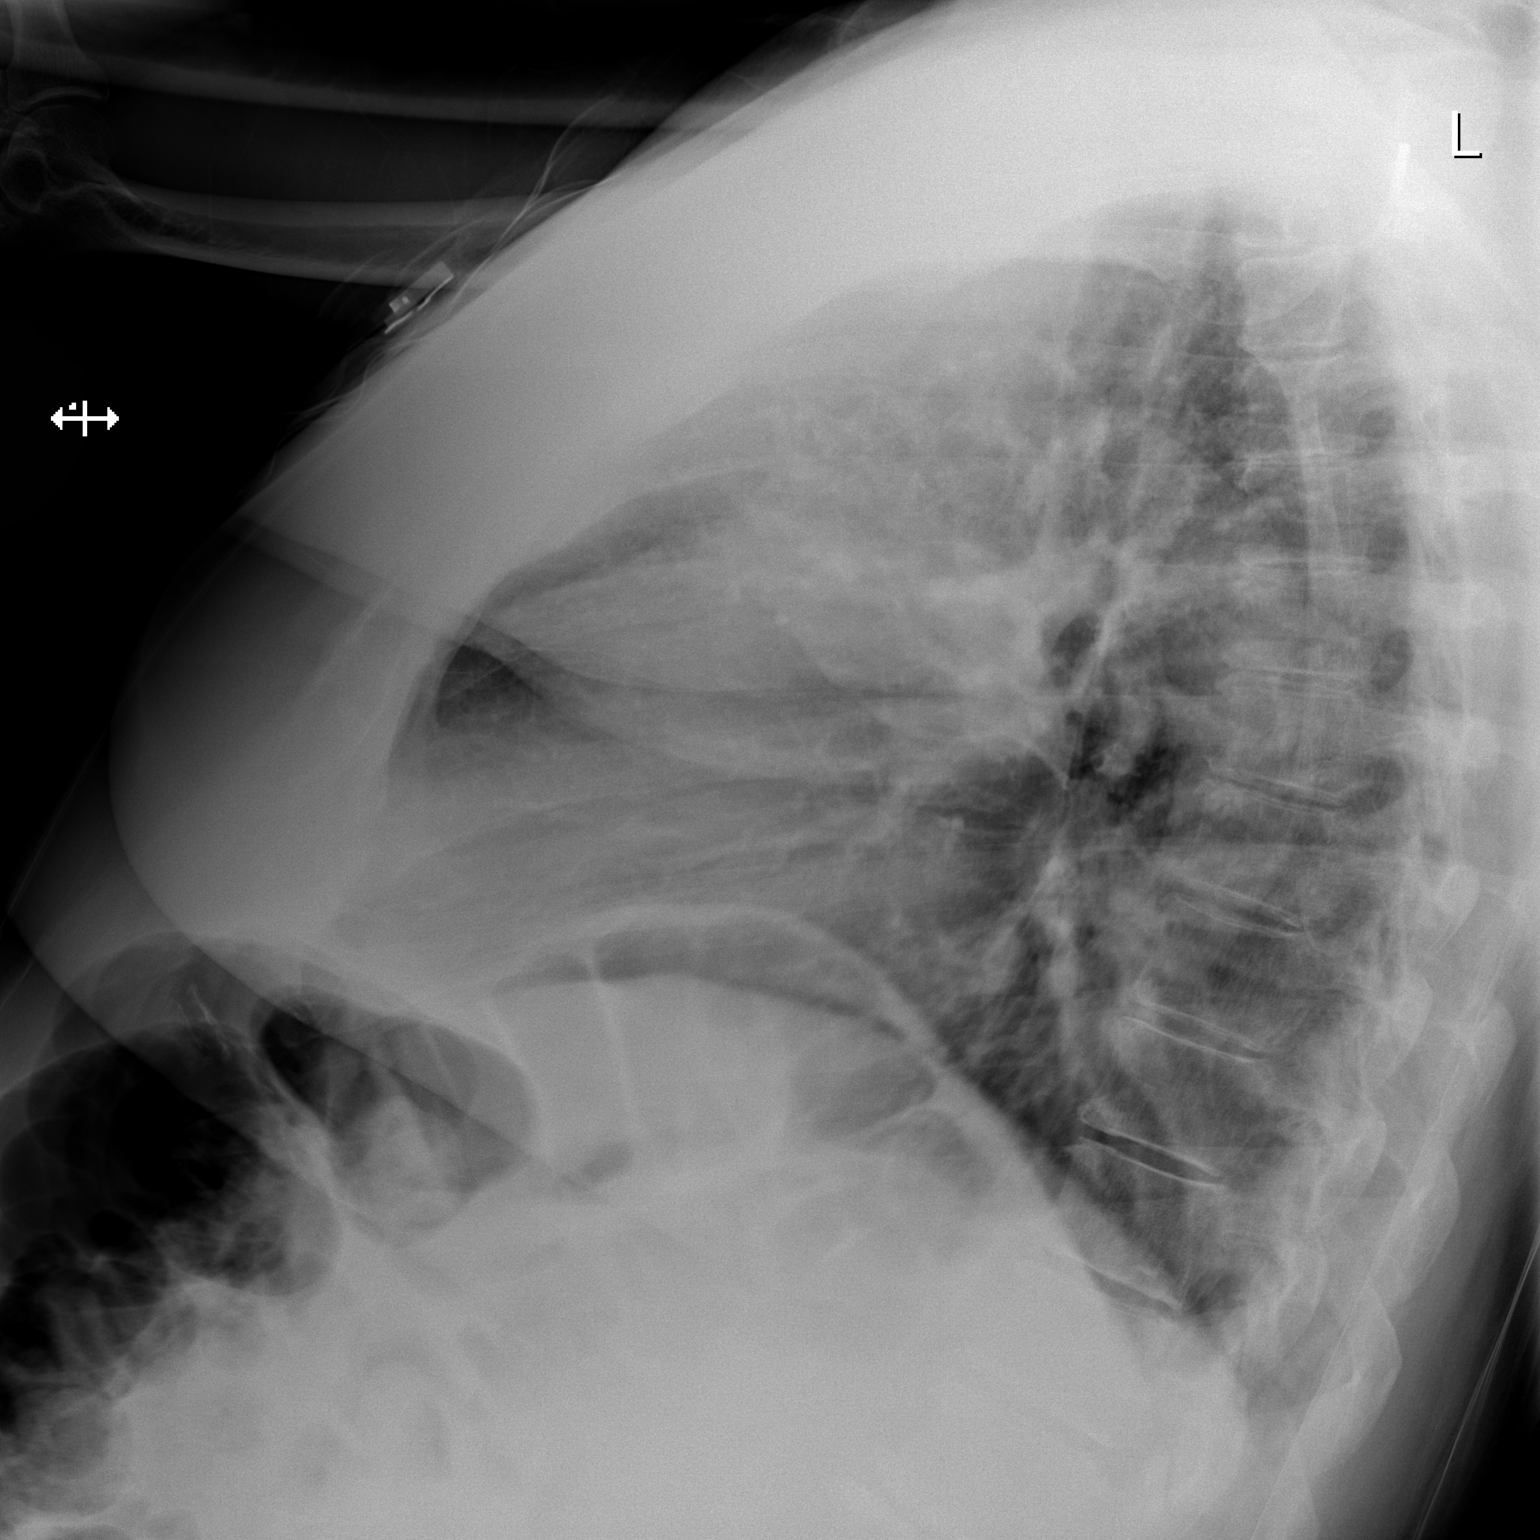

[x chest ap]
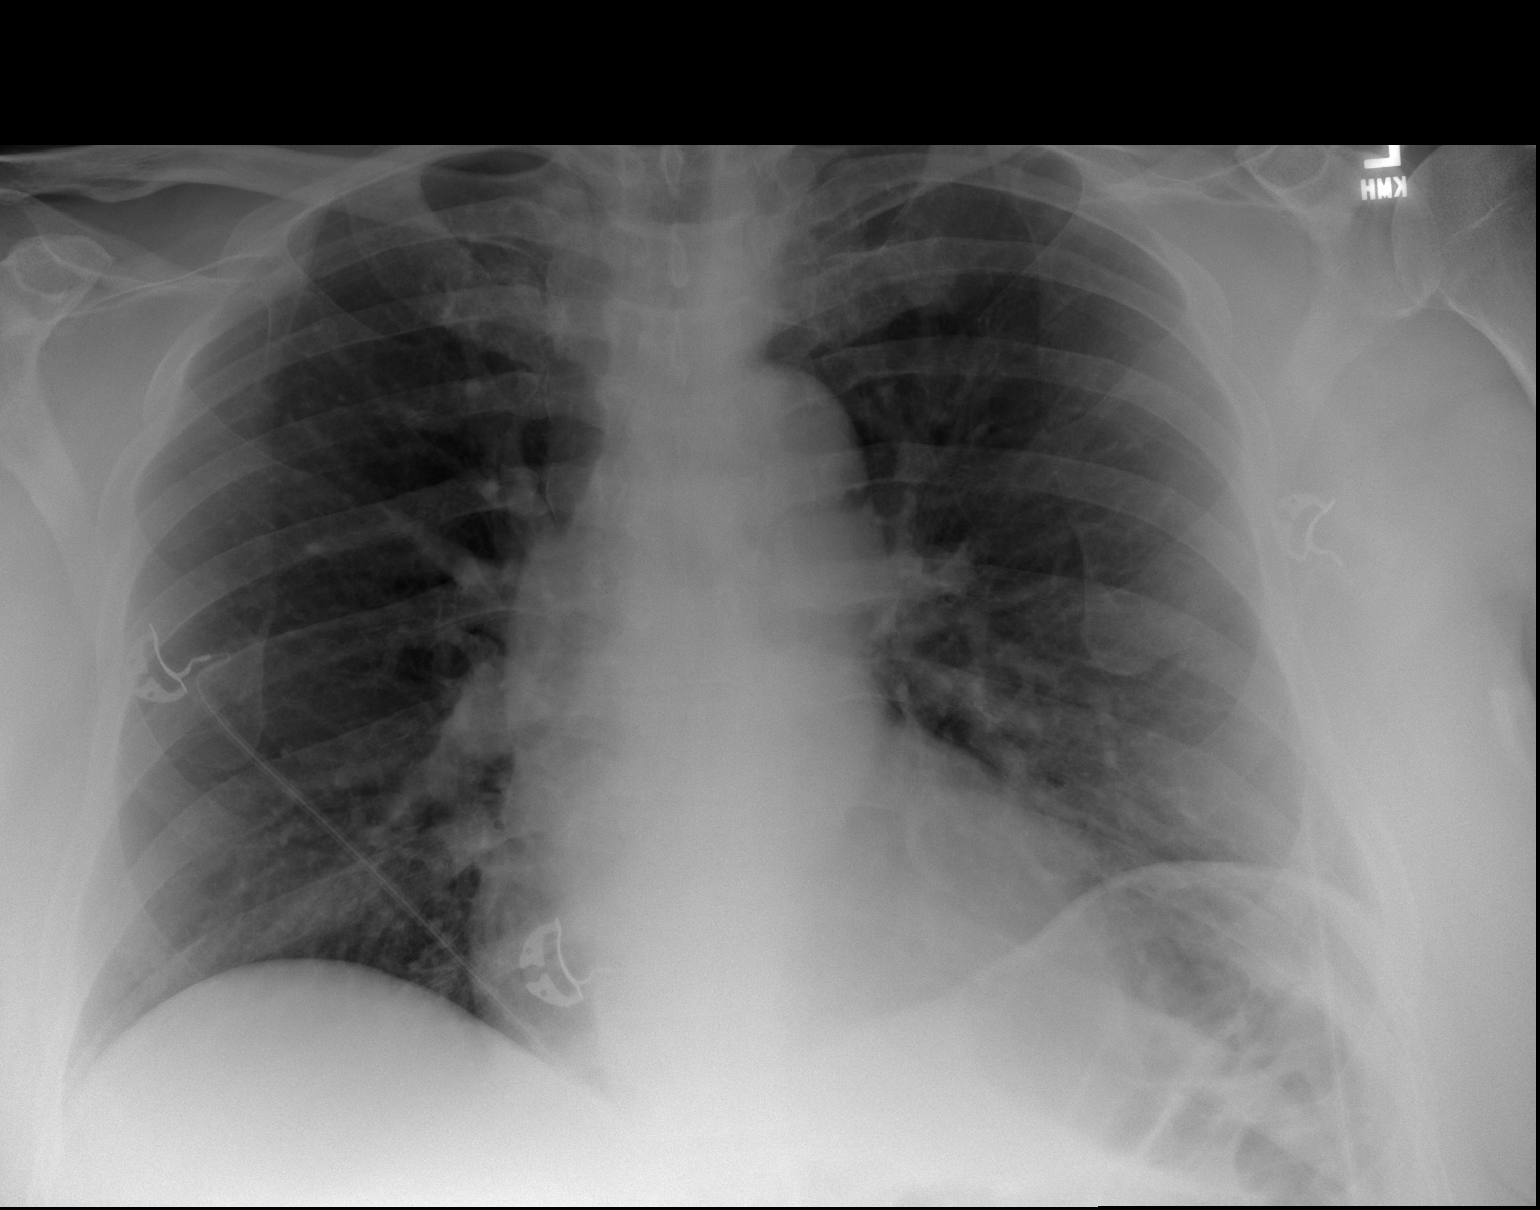

[2 of 2 positions shown; findings below may reference images not displayed]

FINDINGS: Cardiac shadow is stable. The lungs are well aerated bilaterally. No
focal infiltrate or sizable effusion is seen. No acute bony
abnormality is noted.
IMPRESSION: No active cardiopulmonary disease.

## 2019-10-09 DIAGNOSIS — R140 Abdominal distension (gaseous): Secondary | ICD-10-CM | POA: Diagnosis not present

## 2019-10-09 DIAGNOSIS — F329 Major depressive disorder, single episode, unspecified: Secondary | ICD-10-CM | POA: Diagnosis not present

## 2019-10-09 DIAGNOSIS — Z13220 Encounter for screening for lipoid disorders: Secondary | ICD-10-CM | POA: Diagnosis not present

## 2019-10-09 DIAGNOSIS — Z125 Encounter for screening for malignant neoplasm of prostate: Secondary | ICD-10-CM | POA: Diagnosis not present

## 2019-10-09 DIAGNOSIS — Z23 Encounter for immunization: Secondary | ICD-10-CM | POA: Diagnosis not present

## 2019-10-09 DIAGNOSIS — Z131 Encounter for screening for diabetes mellitus: Secondary | ICD-10-CM | POA: Diagnosis not present

## 2019-10-09 DIAGNOSIS — F418 Other specified anxiety disorders: Secondary | ICD-10-CM | POA: Diagnosis not present

## 2019-10-19 ENCOUNTER — Ambulatory Visit: Payer: BLUE CROSS/BLUE SHIELD | Admitting: Mental Health

## 2019-10-20 DIAGNOSIS — F4323 Adjustment disorder with mixed anxiety and depressed mood: Secondary | ICD-10-CM | POA: Diagnosis not present

## 2019-10-24 ENCOUNTER — Ambulatory Visit (INDEPENDENT_AMBULATORY_CARE_PROVIDER_SITE_OTHER): Payer: BC Managed Care – PPO | Admitting: Adult Health

## 2019-10-24 ENCOUNTER — Encounter: Payer: Self-pay | Admitting: Adult Health

## 2019-10-24 ENCOUNTER — Other Ambulatory Visit: Payer: Self-pay

## 2019-10-24 DIAGNOSIS — F32A Depression, unspecified: Secondary | ICD-10-CM

## 2019-10-24 DIAGNOSIS — F411 Generalized anxiety disorder: Secondary | ICD-10-CM

## 2019-10-24 DIAGNOSIS — F329 Major depressive disorder, single episode, unspecified: Secondary | ICD-10-CM

## 2019-10-24 NOTE — Progress Notes (Signed)
Jason Ingram [id number] 18-Dec-1955 63 y.o.  Virtual Visit via Telephone Note  I connected with pt on 10/24/19 at  4:40 PM EST by telephone and verified that I am speaking with the correct person using two identifiers.   I discussed the limitations, risks, security and privacy concerns of performing an evaluation and management service by telephone and the availability of in person appointments. I also discussed with the patient that there may be a patient responsible charge related to this service. The patient expressed understanding and agreed to proceed.   I discussed the assessment and treatment plan with the patient. The patient was provided an opportunity to ask questions and all were answered. The patient agreed with the plan and demonstrated an understanding of the instructions.   The patient was advised to call back or seek an in-person evaluation if the symptoms worsen or if the condition fails to improve as anticipated.  I provided  20 minutes of non-face-to-face time during this encounter.  The patient was located at home.  The provider was located at [place].   Aloha Gell, NP   Subjective:   Patient ID:  Jason Ingram is a 63 y.o. (DOB 1956-07-09) male.  Chief Complaint: No chief complaint on file.   HPI JAMARRI VUNCANNON presents for follow-up of depression and anxiety  Describes mood today as "so-so". Pleasant. Nervous. Mood symptoms - reports depression, anxiety, and irritability. Feels more anxious overall. Had been taking Paxil for several years and has been titrating off. Stopped completely 2 to 3 months ago and feels anxiety is "through the roof". PCP gave him a prescription for Lexapro. Has not started. Concerned that his fiance will be mad at him if he restarts medications. Asked about supplemental options and therapy. Planning to see therapist. Currently in [place] with fiance - her father is ill. Stable interest and motivation.  Energy levels  stable. Active, does not have a regular exercise routine. Works full-time. Enjoys some usual interests and activities. Spending time with fiance.  Appetite adequate. Weight loss 140 pounds since March 2020. Sleeps well most nights. Averages 6 to 8 hours. Focus and concentration mostly stable - "worse with increased anxiety". Completing tasks. Managing aspects of household. Work going well.  Denies SI or HI. Denies AH or VH.   Review of Systems:  Review of Systems  Musculoskeletal: Negative for arthralgias and gait problem.  Psychiatric/Behavioral: The patient is nervous/anxious.     Medications: I have reviewed the patient's current medications.  Current Outpatient Medications  Medication Sig Dispense Refill  . baclofen (LIORESAL) 10 MG tablet Take 10 mg 3 (three) times daily as needed by mouth for muscle spasms.    . cloNIDine (CATAPRES) 0.3 MG tablet TAKE 1 TABLET(0.3 MG) BY MOUTH DAILY 90 tablet 1  . finasteride (PROSCAR) 5 MG tablet Take 1 tablet (5 mg total) by mouth daily. 30 tablet 1  . gabapentin (NEURONTIN) 300 MG capsule Take 1 capsule (300 mg total) by mouth 3 (three) times daily. Gabapentin 300 mg Protocol Take a 300 mg capsule three times a day for one week, Then a 300 mg capsule twice a day for one week, Then a 300 mg capsule once a day for one week, then discontinue the Gabapentin. (Patient not taking: Reported on 01/17/2019) 42 capsule 0  . methocarbamol (ROBAXIN) 500 MG tablet Take 1 tablet (500 mg total) by mouth every 6 (six) hours as needed for muscle spasms. 80 tablet 0  . oxyCODONE (OXY IR/ROXICODONE)  5 MG immediate release tablet Take 1 tablet (5 mg total) by mouth every 6 (six) hours as needed for moderate pain or severe pain. (Patient not taking: Reported on 01/17/2019) 80 tablet 0  . PARoxetine (PAXIL) 20 MG tablet Take 1 tablet (20 mg total) by mouth daily. 30 tablet 0  . rivaroxaban (XARELTO) 10 MG TABS tablet Take 1 tablet (10 mg total) by mouth daily with  breakfast. Take Xarelto for two and a half more weeks following discharge from the hospital, then discontinue Xarelto. Once the patient has completed the blood thinner regimen, then take a Baby 81 mg Aspirin daily for three more weeks. (Patient not taking: Reported on 01/17/2019) 19 tablet 0  . simvastatin (ZOCOR) 40 MG tablet TAKE 1 TABLET BY MOUTH AT BEDTIME FOR CHOLESTEROL 30 tablet 0  . tamsulosin (FLOMAX) 0.4 MG CAPS capsule Take 0.4 mg daily after breakfast by mouth.    . traMADol (ULTRAM) 50 MG tablet Take 1 tablet (50 mg total) by mouth every 6 (six) hours as needed for moderate pain. (Patient not taking: Reported on 01/17/2019) 20 tablet 0  . triamterene-hydrochlorothiazide (DYAZIDE) 37.5-25 MG capsule TAKE 1 CAPSULE BY MOUTH DAILY 90 capsule 1   No current facility-administered medications for this visit.     Medication Side Effects: None  Allergies: No Known Allergies  Past Medical History:  Diagnosis Date  . Depression   . High cholesterol   . History of broken nose   . History of pneumonia 1998  . Hx of colonoscopy 2013  . Hypertension   . Injury of pelvis 1997  . Sleep apnea     Family History  Problem Relation Age of Onset  . Lymphoma Mother   . Bipolar disorder Son   . Heart disease Other        multiple    Social History   Socioeconomic History  . Marital status: Married    Spouse name: Not on file  . Number of children: Not on file  . Years of education: Not on file  . Highest education level: Not on file  Occupational History  . Occupation: Paediatric nurse  . Financial resource strain: Not on file  . Food insecurity    Worry: Not on file    Inability: Not on file  . Transportation needs    Medical: Not on file    Non-medical: Not on file  Tobacco Use  . Smoking status: Never Smoker  . Smokeless tobacco: Never Used  Substance and Sexual Activity  . Alcohol use: Yes    Alcohol/week: 1.0 standard drinks    Types: 1 Standard drinks or  equivalent per week    Comment: 4 drinks weekly  . Drug use: No  . Sexual activity: Not on file  Lifestyle  . Physical activity    Days per week: Not on file    Minutes per session: Not on file  . Stress: Not on file  Relationships  . Social Musician on phone: Not on file    Gets together: Not on file    Attends religious service: Not on file    Active member of club or organization: Not on file    Attends meetings of clubs or organizations: Not on file    Relationship status: Not on file  . Intimate partner violence    Fear of current or ex partner: Not on file    Emotionally abused: Not on file    Physically abused:  Not on file    Forced sexual activity: Not on file  Other Topics Concern  . Not on file  Social History Narrative   Diet: N/A   Caffeine: Yes    Married: Yes, 2016   House: House, 2 stories    Pets: 2 Dogs   Current/Past profession: Building services engineerngineer- [employer]    Exercise: Yes, daily   Living Will: No   DNR: No   POA/HPOA: No     Past Medical History, Surgical history, Social history, and Family history were reviewed and updated as appropriate.   Please see review of systems for further details on the patient's review from today.   Objective:   Physical Exam:  There were no vitals taken for this visit.  Physical Exam Constitutional:      General: He is not in acute distress.    Appearance: He is well-developed.  Musculoskeletal:        General: No deformity.  Neurological:     Mental Status: He is alert and oriented to person, place, and time.     Coordination: Coordination normal.  Psychiatric:        Attention and Perception: Attention and perception normal. He does not perceive auditory or visual hallucinations.        Mood and Affect: Mood normal. Mood is not anxious or depressed. Affect is not labile, blunt, angry or inappropriate.        Speech: Speech normal.        Behavior: Behavior normal.        Thought Content: Thought  content normal. Thought content is not paranoid or delusional. Thought content does not include homicidal or suicidal ideation. Thought content does not include homicidal or suicidal plan.        Cognition and Memory: Cognition and memory normal.        Judgment: Judgment normal.     Comments: Insight intact     Lab Review:     Component Value Date/Time   NA 137 11/12/2017 0620   NA 141 05/08/2016 1630   K 3.3 (L) 11/12/2017 0620   CL 101 11/12/2017 0620   CO2 29 11/12/2017 0620   GLUCOSE 108 (H) 11/12/2017 0620   BUN 15 11/12/2017 0620   BUN 17 05/08/2016 1630   CREATININE 0.84 11/12/2017 0620   CREATININE 1.07 09/29/2017 1110   CALCIUM 8.2 (L) 11/12/2017 0620   PROT 6.1 (L) 11/11/2017 2014   PROT 7.0 05/08/2016 1630   ALBUMIN 2.9 (L) 11/11/2017 2014   ALBUMIN 4.4 05/08/2016 1630   AST 15 11/11/2017 2014   ALT 14 (L) 11/11/2017 2014   ALKPHOS 62 11/11/2017 2014   BILITOT 0.9 11/11/2017 2014   BILITOT 0.4 05/08/2016 1630   GFRNONAA >60 11/12/2017 0620   GFRNONAA 74 09/29/2017 1110   GFRAA >60 11/12/2017 0620   GFRAA 86 09/29/2017 1110       Component Value Date/Time   WBC 7.6 11/12/2017 0620   RBC 3.70 (L) 11/12/2017 0620   HGB 10.7 (L) 11/12/2017 0620   HCT 32.6 (L) 11/12/2017 0620   PLT 213 11/12/2017 0620   MCV 88.1 11/12/2017 0620   MCH 28.9 11/12/2017 0620   MCHC 32.8 11/12/2017 0620   RDW 13.4 11/12/2017 0620   LYMPHSABS 1.5 11/11/2017 2014   MONOABS 0.9 11/11/2017 2014   EOSABS 0.0 11/11/2017 2014   BASOSABS 0.0 11/11/2017 2014    No results found for: POCLITH, LITHIUM   No results found for: PHENYTOIN, PHENOBARB, VALPROATE, CBMZ   .  res Assessment: Plan:    Plan:  Discussed Lexapro and directions on starting - PCP prescribed Discussed supplemental options for anxiety. Discussed therapy - plans to start when returning from [place].   RTC 4 weeks  Patient advised to contact office with any questions, adverse effects, or acute worsening in signs  and symptoms.  Diagnoses and all orders for this visit:  Depression, unspecified depression type  Generalized anxiety disorder    Please see After Visit Summary for patient specific instructions.  No future appointments.  No orders of the defined types were placed in this encounter.     -------------------------------

## 2019-11-22 DIAGNOSIS — Z20828 Contact with and (suspected) exposure to other viral communicable diseases: Secondary | ICD-10-CM | POA: Diagnosis not present

## 2019-12-04 DIAGNOSIS — F4323 Adjustment disorder with mixed anxiety and depressed mood: Secondary | ICD-10-CM | POA: Diagnosis not present

## 2020-05-01 ENCOUNTER — Encounter: Payer: Self-pay | Admitting: Adult Health

## 2020-05-01 ENCOUNTER — Ambulatory Visit: Payer: BC Managed Care – PPO | Admitting: Mental Health

## 2020-05-01 ENCOUNTER — Ambulatory Visit (INDEPENDENT_AMBULATORY_CARE_PROVIDER_SITE_OTHER): Payer: BC Managed Care – PPO | Admitting: Adult Health

## 2020-05-01 ENCOUNTER — Other Ambulatory Visit: Payer: Self-pay

## 2020-05-01 DIAGNOSIS — F329 Major depressive disorder, single episode, unspecified: Secondary | ICD-10-CM

## 2020-05-01 DIAGNOSIS — F411 Generalized anxiety disorder: Secondary | ICD-10-CM

## 2020-05-01 DIAGNOSIS — F32A Depression, unspecified: Secondary | ICD-10-CM

## 2020-05-01 MED ORDER — ESCITALOPRAM OXALATE 10 MG PO TABS: 10.0000 mg | ORAL_TABLET | Freq: Every day | ORAL | 1 refills | Status: DC

## 2020-05-01 NOTE — Progress Notes (Signed)
GRIFFIN GERRARD [id number] 26-May-1956 64 y.o.  Subjective:   Patient ID:  NOLLAN MULDROW is a 64 y.o. (DOB Dec 30, 1955) male.  Chief Complaint: No chief complaint on file.   HPI Jason Ingram presents to the office today for follow-up of depression and anxiety  Describes mood today as "so-so". Pleasant. Nervous. Mood symptoms - reports depression, anxiety, and irritability. Having trouble making decisions. Dreads doing things he used to do with no problem. Not as anxious as he was prior to taking Lexapro. Broke up with fiance on April 3rd - dated 10 to 11 months. Left on a business trip right after break up and was gone for 2 months. Living in basement at sister's in [facility]. Planning to see therapist - Elio Forget. Varying interest and motivation.  Energy levels "fine". Active, does not have a regular exercise routine. Walks 2 miles every day. Works full-time. Enjoys some usual interests and activities. Single. Lives alone.  Appetite adequate. Weight stable - 258.2. Plans to lose weight.  Sleeps well most nights. Averages 8 hours. Focus and concentration stable - "pretty much ok". Completing tasks. Managing aspects of household. Work going well.  Denies SI or HI. Denies AH or VH.  Previous medications: Paxil, Zoloft  PHQ2-9     Office Visit from 09/29/2017 in [facility] Visit from 01/31/2016 in [facility] Visit from 10/09/2015 in [facility] at [facility] Total Score  0  0  0       Review of Systems:  Review of Systems  Musculoskeletal: Negative for gait problem.  Neurological: Negative for tremors.  Psychiatric/Behavioral:       Please refer to HPI    Medications: I have reviewed the patient's current medications.  Current Outpatient Medications  Medication Sig Dispense Refill  . baclofen (LIORESAL) 10 MG tablet Take 10 mg 3 (three) times daily as needed by mouth for muscle spasms.    . cloNIDine (CATAPRES) 0.3 MG tablet TAKE 1  TABLET(0.3 MG) BY MOUTH DAILY 90 tablet 1  . escitalopram (LEXAPRO) 10 MG tablet Take 1 tablet (10 mg total) by mouth daily. 90 tablet 1  . finasteride (PROSCAR) 5 MG tablet Take 1 tablet (5 mg total) by mouth daily. 30 tablet 1  . gabapentin (NEURONTIN) 300 MG capsule Take 1 capsule (300 mg total) by mouth 3 (three) times daily. Gabapentin 300 mg Protocol Take a 300 mg capsule three times a day for one week, Then a 300 mg capsule twice a day for one week, Then a 300 mg capsule once a day for one week, then discontinue the Gabapentin. (Patient not taking: Reported on 01/17/2019) 42 capsule 0  . methocarbamol (ROBAXIN) 500 MG tablet Take 1 tablet (500 mg total) by mouth every 6 (six) hours as needed for muscle spasms. 80 tablet 0  . oxyCODONE (OXY IR/ROXICODONE) 5 MG immediate release tablet Take 1 tablet (5 mg total) by mouth every 6 (six) hours as needed for moderate pain or severe pain. (Patient not taking: Reported on 01/17/2019) 80 tablet 0  . rivaroxaban (XARELTO) 10 MG TABS tablet Take 1 tablet (10 mg total) by mouth daily with breakfast. Take Xarelto for two and a half more weeks following discharge from the hospital, then discontinue Xarelto. Once the patient has completed the blood thinner regimen, then take a Baby 81 mg Aspirin daily for three more weeks. (Patient not taking: Reported on 01/17/2019) 19 tablet 0  . simvastatin (ZOCOR) 40 MG tablet TAKE  1 TABLET BY MOUTH AT BEDTIME FOR CHOLESTEROL 30 tablet 0  . tamsulosin (FLOMAX) 0.4 MG CAPS capsule Take 0.4 mg daily after breakfast by mouth.    . traMADol (ULTRAM) 50 MG tablet Take 1 tablet (50 mg total) by mouth every 6 (six) hours as needed for moderate pain. (Patient not taking: Reported on 01/17/2019) 20 tablet 0  . triamterene-hydrochlorothiazide (DYAZIDE) 37.5-25 MG capsule TAKE 1 CAPSULE BY MOUTH DAILY 90 capsule 1   No current facility-administered medications for this visit.    Medication Side Effects: None  Allergies: No Known  Allergies  Past Medical History:  Diagnosis Date  . Depression   . High cholesterol   . History of broken nose   . History of pneumonia 1998  . Hx of colonoscopy 2013  . Hypertension   . Injury of pelvis 1997  . Sleep apnea     Family History  Problem Relation Age of Onset  . Lymphoma Mother   . Bipolar disorder Son   . Heart disease Other        multiple    Social History   Socioeconomic History  . Marital status: Married    Spouse name: Not on file  . Number of children: Not on file  . Years of education: Not on file  . Highest education level: Not on file  Occupational History  . Occupation: Chief Financial Officer  Tobacco Use  . Smoking status: Never Smoker  . Smokeless tobacco: Never Used  Substance and Sexual Activity  . Alcohol use: Yes    Alcohol/week: 1.0 standard drinks    Types: 1 Standard drinks or equivalent per week    Comment: 4 drinks weekly  . Drug use: No  . Sexual activity: Not on file  Other Topics Concern  . Not on file  Social History Narrative   Diet: N/A   Caffeine: Yes    Married: Yes, 2016   House: House, 2 stories    Pets: 2 Dogs   Current/Past profession: Contractor    Exercise: Yes, daily   Living Will: No   DNR: No   POA/HPOA: No    Social Determinants of Radio broadcast assistant Strain:   . Difficulty of Paying Living Expenses:   Food Insecurity:   . Worried About Charity fundraiser in the Last Year:   . Arboriculturist in the Last Year:   Transportation Needs:   . Film/video editor (Medical):   Marland Kitchen Lack of Transportation (Non-Medical):   Physical Activity:   . Days of Exercise per Week:   . Minutes of Exercise per Session:   Stress:   . Feeling of Stress :   Social Connections:   . Frequency of Communication with Friends and Family:   . Frequency of Social Gatherings with Friends and Family:   . Attends Religious Services:   . Active Member of Clubs or Organizations:   . Attends Archivist  Meetings:   Marland Kitchen Marital Status:   Intimate Partner Violence:   . Fear of Current or Ex-Partner:   . Emotionally Abused:   Marland Kitchen Physically Abused:   . Sexually Abused:     Past Medical History, Surgical history, Social history, and Family history were reviewed and updated as appropriate.   Please see review of systems for further details on the patient's review from today.   Objective:   Physical Exam:  There were no vitals taken for this visit.  Physical Exam Constitutional:  General: He is not in acute distress. Musculoskeletal:        General: No deformity.  Neurological:     Mental Status: He is alert and oriented to person, place, and time.     Coordination: Coordination normal.  Psychiatric:        Attention and Perception: Attention and perception normal. He does not perceive auditory or visual hallucinations.        Mood and Affect: Mood is anxious and depressed. Affect is not labile, blunt, angry or inappropriate.        Speech: Speech normal.        Behavior: Behavior normal.        Thought Content: Thought content normal. Thought content is not paranoid or delusional. Thought content does not include homicidal or suicidal ideation. Thought content does not include homicidal or suicidal plan.        Cognition and Memory: Cognition and memory normal.        Judgment: Judgment normal.     Comments: Insight intact     Lab Review:     Component Value Date/Time   NA 137 11/12/2017 0620   NA 141 05/08/2016 1630   K 3.3 (L) 11/12/2017 0620   CL 101 11/12/2017 0620   CO2 29 11/12/2017 0620   GLUCOSE 108 (H) 11/12/2017 0620   BUN 15 11/12/2017 0620   BUN 17 05/08/2016 1630   CREATININE 0.84 11/12/2017 0620   CREATININE 1.07 09/29/2017 1110   CALCIUM 8.2 (L) 11/12/2017 0620   PROT 6.1 (L) 11/11/2017 2014   PROT 7.0 05/08/2016 1630   ALBUMIN 2.9 (L) 11/11/2017 2014   ALBUMIN 4.4 05/08/2016 1630   AST 15 11/11/2017 2014   ALT 14 (L) 11/11/2017 2014   ALKPHOS 62  11/11/2017 2014   BILITOT 0.9 11/11/2017 2014   BILITOT 0.4 05/08/2016 1630   GFRNONAA >60 11/12/2017 0620   GFRNONAA 74 09/29/2017 1110   GFRAA >60 11/12/2017 0620   GFRAA 86 09/29/2017 1110       Component Value Date/Time   WBC 7.6 11/12/2017 0620   RBC 3.70 (L) 11/12/2017 0620   HGB 10.7 (L) 11/12/2017 0620   HCT 32.6 (L) 11/12/2017 0620   PLT 213 11/12/2017 0620   MCV 88.1 11/12/2017 0620   MCH 28.9 11/12/2017 0620   MCHC 32.8 11/12/2017 0620   RDW 13.4 11/12/2017 0620   LYMPHSABS 1.5 11/11/2017 2014   MONOABS 0.9 11/11/2017 2014   EOSABS 0.0 11/11/2017 2014   BASOSABS 0.0 11/11/2017 2014    No results found for: POCLITH, LITHIUM   No results found for: PHENYTOIN, PHENOBARB, VALPROATE, CBMZ   .res Assessment: Plan:    Plan:  Continue Lexapro 10mg  daily   Starting therapy today with  RTC 4 weeks  Patient advised to contact office with any questions, adverse effects, or acute worsening in signs and symptoms.   Diagnoses and all orders for this visit:  Depression, unspecified depression type -     escitalopram (LEXAPRO) 10 MG tablet; Take 1 tablet (10 mg total) by mouth daily.  Generalized anxiety disorder -     escitalopram (LEXAPRO) 10 MG tablet; Take 1 tablet (10 mg total) by mouth daily.     Please see After Visit Summary for patient specific instructions.  Future Appointments  Date Time Provider Department Center  05/01/2020  1:00 PM 05/03/2020, [facility] None    No orders of the defined types were placed in this encounter.   -------------------------------
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
## 2020-05-09 ENCOUNTER — Ambulatory Visit (INDEPENDENT_AMBULATORY_CARE_PROVIDER_SITE_OTHER): Payer: BC Managed Care – PPO | Admitting: Mental Health

## 2020-05-09 ENCOUNTER — Other Ambulatory Visit: Payer: Self-pay

## 2020-05-09 DIAGNOSIS — F32A Depression, unspecified: Secondary | ICD-10-CM

## 2020-05-09 DIAGNOSIS — F411 Generalized anxiety disorder: Secondary | ICD-10-CM

## 2020-05-09 DIAGNOSIS — F329 Major depressive disorder, single episode, unspecified: Secondary | ICD-10-CM | POA: Diagnosis not present

## 2020-05-09 NOTE — Progress Notes (Signed)
[facility] Psychotherapy Note  Name: Jason Ingram Date: 05/09/20 MRN: [id number] DOB: [birth year] PCP: Patient, No Pcp Per  Time spent: 53 minutes Treatment: individual therapy   Mental Status Exam:   Appearance:   Casual     Behavior:  Appropriate  Motor:  Normal  Speech/Language:   Clear and Coherent  Affect:  Constricted  Mood:  anxious and depressed  Thought process:  normal  Thought content:    WNL  Sensory/Perceptual disturbances:    none  Orientation:  x4  Attention:  Good  Concentration:  Good  Memory:  WNL  Fund of knowledge:   Good  Insight:    Good  Judgment:   Good  Impulse Control:  Good   Reported Symptoms:  Depressed mood, anxious, negative self talk, rumination  Risk Assessment: Danger to Self:  No Self-injurious Behavior: No Danger to Others: No Duty to Warn:no Physical Aggression / Violence:No  Access to Firearms a concern: No  Gang Involvement:No  Patient / guardian was educated about steps to take if suicide or homicide risk level increases between visits: yes While future psychiatric events cannot be accurately predicted, the patient does not currently require acute inpatient psychiatric care and does not currently meet [facility] involuntary commitment criteria.  Substance Abuse History: Current substance abuse: No     Medical History/Surgical History: Past Medical History:  Diagnosis Date  . Depression   . High cholesterol   . History of broken nose   . History of pneumonia 1998  . Hx of colonoscopy 2013  . Hypertension   . Injury of pelvis 1997  . Sleep apnea     Past Surgical History:  Procedure Laterality Date  . COLONOSCOPY  05/2012   Dr. Gerline Legacy  . FRACTURE SURGERY    . HAND SURGERY  2000   Thumb Repair  . JOINT REPLACEMENT    . KNEE SURGERY  12/07/1976   [facility]  . KNEE SURGERY  2007  . [place] ARTHROPLASTY Left 11/08/2017   Procedure: LEFT TOTAL KNEE ARTHROPLASTY;  Surgeon:  Gaynelle Arabian, MD;  Location: [facility];  Service: Orthopedics;  Laterality: Left;    Medications: Current Outpatient Medications  Medication Sig Dispense Refill  . baclofen (LIORESAL) 10 MG tablet Take 10 mg 3 (three) times daily as needed by mouth for muscle spasms.    . cloNIDine (CATAPRES) 0.3 MG tablet TAKE 1 TABLET(0.3 MG) BY MOUTH DAILY 90 tablet 1  . escitalopram (LEXAPRO) 10 MG tablet Take 1 tablet (10 mg total) by mouth daily. 90 tablet 1  . finasteride (PROSCAR) 5 MG tablet Take 1 tablet (5 mg total) by mouth daily. 30 tablet 1  . gabapentin (NEURONTIN) 300 MG capsule Take 1 capsule (300 mg total) by mouth 3 (three) times daily. Gabapentin 300 mg Protocol Take a 300 mg capsule three times a day for one week, Then a 300 mg capsule twice a day for one week, Then a 300 mg capsule once a day for one week, then discontinue the Gabapentin. (Patient not taking: Reported on 01/17/2019) 42 capsule 0  . methocarbamol (ROBAXIN) 500 MG tablet Take 1 tablet (500 mg total) by mouth every 6 (six) hours as needed for muscle spasms. 80 tablet 0  . oxyCODONE (OXY IR/ROXICODONE) 5 MG immediate release tablet Take 1 tablet (5 mg total) by mouth every 6 (six) hours as needed for moderate pain or severe pain. (Patient not taking: Reported on 01/17/2019)  80 tablet 0  . rivaroxaban (XARELTO) 10 MG TABS tablet Take 1 tablet (10 mg total) by mouth daily with breakfast. Take Xarelto for two and a half more weeks following discharge from the hospital, then discontinue Xarelto. Once the patient has completed the blood thinner regimen, then take a Baby 81 mg Aspirin daily for three more weeks. (Patient not taking: Reported on 01/17/2019) 19 tablet 0  . simvastatin (ZOCOR) 40 MG tablet TAKE 1 TABLET BY MOUTH AT BEDTIME FOR CHOLESTEROL 30 tablet 0  . tamsulosin (FLOMAX) 0.4 MG CAPS capsule Take 0.4 mg daily after breakfast by mouth.    . traMADol (ULTRAM) 50 MG tablet Take 1 tablet (50 mg total) by mouth every 6 (six)  hours as needed for moderate pain. (Patient not taking: Reported on 01/17/2019) 20 tablet 0  . triamterene-hydrochlorothiazide (DYAZIDE) 37.5-25 MG capsule TAKE 1 CAPSULE BY MOUTH DAILY 90 capsule 1   No current facility-administered medications for this visit.    No Known Allergies  Abuse History: Victim -denied Report needed: none Victim of Neglect:No. Perpetrator of -none  Witness / Exposure to Domestic Violence: No   Protective Services Involvement: No  Witness to [employer] Violence:  No   Family History: both parents deceased. Sister-lives in [place]. [place] Family History  Problem Relation Age of Onset  . Lymphoma Mother   . Bipolar disorder Son   . Heart disease Other        multiple    Social History:  Social History   Socioeconomic History  . Marital status: Married    Spouse name: Not on file  . Number of children: Not on file  . Years of education: Not on file  . Highest education level: Not on file  Occupational History  . Occupation: Art gallery manager  Tobacco Use  . Smoking status: Never Smoker  . Smokeless tobacco: Never Used  Vaping Use  . Vaping Use: Never used  Substance and Sexual Activity  . Alcohol use: Yes    Alcohol/week: 1.0 standard drink    Types: 1 Standard drinks or equivalent per week    Comment: 4 drinks weekly  . Drug use: No  . Sexual activity: Not on file  Other Topics Concern  . Not on file  Social History Narrative   Diet: N/A   Caffeine: Yes    Married: Yes, 2016   House: House, 2 stories    Pets: 2 Dogs   Current/Past profession: Building services engineer    Exercise: Yes, daily   Living Will: No   DNR: No   POA/HPOA: No    Social Determinants of Corporate investment banker Strain:   . Difficulty of Paying Living Expenses:   Food Insecurity:   . Worried About Programme researcher, broadcasting/film/video in the Last Year:   . Barista in the Last Year:   Transportation Needs:   . Freight forwarder (Medical):   Marland Kitchen Lack of Transportation  (Non-Medical):   Physical Activity:   . Days of Exercise per Week:   . Minutes of Exercise per Session:   Stress:   . Feeling of Stress :   Social Connections:   . Frequency of Communication with Friends and Family:   . Frequency of Social Gatherings with Friends and Family:   . Attends Religious Services:   . Active Member of Clubs or Organizations:   . Attends Banker Meetings:   Marland Kitchen Marital Status:     Living situation: the patient lives  w/ his sister currently  Sexual Orientation:  hetero  Relationship Status:  divorced  Name of spouse / other: none             If a parent, number of children / ages: Mathew-age 50; Marchelle Folks- age 50  Support Systems; family  Financial Stress:  No   Income/Employment/Disability: full time  Financial planner: No   Educational History: Education: Risk manager:   none  Any cultural differences that may affect / interfere with treatment:  none  Recreation/Hobbies:    Stressors: financial, interpersonal  Strengths:  employment, support system  Barriers:  none  Legal History: Pending legal issue / charges: none History of legal issue / charges: none   Subjective:  Patient presents for session on time.  Continue to assess, completing the second portion of the assessment with patient.  He was able to share relevant history, focusing on his past marital relationships giving significant detail pointing out similarities in all his relationships either during his 2 marriages or after when dating.  He noticed his tendency to try and "fix" there is in which she becomes involved possibly reinforcing sense of security.  Shared some personal experiences growing up, being overweight for years this being a source of distress socially at times.  Lost a considerable amount of weight over the past year up to 140 pounds.  He identified the need to maintain level of socialization, having friends to spend time  with and plans to take steps in this area by phone 3 over the next few days.  Lives in her sister's home currently as he is adjusting to leaving his last relationship.  A considerable amount of negative self talk around this adjustment period.  Provided support and encouragement as he collaboratively identified some action steps to take between sessions to feel that he is making progress with increasing his independence.  Interventions: Assessment, CBT, supportive therapy  Diagnoses:    ICD-10-CM   1. Depression, unspecified depression type  F32.9   2. Generalized anxiety disorder  F41.1    Plan: Patient is to use CBT, mindfulness and coping skills to help manage decrease symptoms associated with their diagnosis.  Patient to follow through on identifying social outlets and possible housing options.   Long-term goal:   Reduce overall level, frequency, and intensity of the feelings of depression and anxiety 7/10 to a 0-2/10 in severity for at least 3 consecutive months.   Short-term goal:  Decrease "deflating, self-doubting" and catastrophizing thinking style Decrease ruminating, especially at night which can affect his sleep Follow through on finding his own residence Identify and engage with social support system Increase self insight which will improve his developing healthy interpersonal relationships    Assessment of progress:  progressing   [name] Session, [facility]

## 2020-05-29 ENCOUNTER — Ambulatory Visit: Payer: BC Managed Care – PPO | Admitting: Adult Health

## 2020-05-29 ENCOUNTER — Ambulatory Visit: Payer: BC Managed Care – PPO | Admitting: Mental Health

## 2020-05-30 ENCOUNTER — Ambulatory Visit (INDEPENDENT_AMBULATORY_CARE_PROVIDER_SITE_OTHER): Payer: BC Managed Care – PPO | Admitting: Mental Health

## 2020-05-30 ENCOUNTER — Other Ambulatory Visit: Payer: Self-pay

## 2020-05-30 DIAGNOSIS — F329 Major depressive disorder, single episode, unspecified: Secondary | ICD-10-CM | POA: Diagnosis not present

## 2020-05-30 DIAGNOSIS — F411 Generalized anxiety disorder: Secondary | ICD-10-CM | POA: Diagnosis not present

## 2020-05-30 DIAGNOSIS — F32A Depression, unspecified: Secondary | ICD-10-CM
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
## 2020-06-12 ENCOUNTER — Ambulatory Visit (INDEPENDENT_AMBULATORY_CARE_PROVIDER_SITE_OTHER): Payer: BC Managed Care – PPO | Admitting: Mental Health

## 2020-06-12 ENCOUNTER — Other Ambulatory Visit: Payer: Self-pay

## 2020-06-12 DIAGNOSIS — F32A Depression, unspecified: Secondary | ICD-10-CM

## 2020-06-12 DIAGNOSIS — F329 Major depressive disorder, single episode, unspecified: Secondary | ICD-10-CM | POA: Diagnosis not present

## 2020-06-16 NOTE — Progress Notes (Signed)
[facility]  Name: Jason Ingram Date: 06/13/20 MRN: [id number] DOB: 03-07-1956 PCP: Patient, No Pcp Per  Time spent: 55 minutes Treatment: individual therapy   Mental Status Exam:   Appearance:   Casual     Behavior:  Appropriate  Motor:  Normal  Speech/Language:   Clear and Coherent  Affect:  Constricted  Mood:  anxious and depressed  Thought process:  normal  Thought content:    WNL  Sensory/Perceptual disturbances:    none  Orientation:  x4  Attention:  Good  Concentration:  Good  Memory:  WNL  Fund of knowledge:   Good  Insight:    Good  Judgment:   Good  Impulse Control:  Good   Reported Symptoms:  Depressed mood, anxious, negative self talk  Risk Assessment: Danger to Self:  No Self-injurious Behavior: No Danger to Others: No Duty to Warn:no Physical Aggression / Violence:No  Access to Firearms a concern: No  Gang Involvement:No  Patient / guardian was educated about steps to take if suicide or homicide risk level increases between visits: yes While future psychiatric events cannot be accurately predicted, the patient does not currently require acute inpatient psychiatric care and does not currently meet [facility] involuntary commitment criteria.  Medications: Current Outpatient Medications  Medication Sig Dispense Refill  . baclofen (LIORESAL) 10 MG tablet Take 10 mg 3 (three) times daily as needed by mouth for muscle spasms.    . cloNIDine (CATAPRES) 0.3 MG tablet TAKE 1 TABLET(0.3 MG) BY MOUTH DAILY 90 tablet 1  . escitalopram (LEXAPRO) 10 MG tablet Take 1 tablet (10 mg total) by mouth daily. 90 tablet 1  . finasteride (PROSCAR) 5 MG tablet Take 1 tablet (5 mg total) by mouth daily. 30 tablet 1  . gabapentin (NEURONTIN) 300 MG capsule Take 1 capsule (300 mg total) by mouth 3 (three) times daily. Gabapentin 300 mg Protocol Take a 300 mg capsule three times a day for one week, Then a 300 mg capsule twice a day for one week, Then a 300  mg capsule once a day for one week, then discontinue the Gabapentin. (Patient not taking: Reported on 01/17/2019) 42 capsule 0  . methocarbamol (ROBAXIN) 500 MG tablet Take 1 tablet (500 mg total) by mouth every 6 (six) hours as needed for muscle spasms. 80 tablet 0  . oxyCODONE (OXY IR/ROXICODONE) 5 MG immediate release tablet Take 1 tablet (5 mg total) by mouth every 6 (six) hours as needed for moderate pain or severe pain. (Patient not taking: Reported on 01/17/2019) 80 tablet 0  . rivaroxaban (XARELTO) 10 MG TABS tablet Take 1 tablet (10 mg total) by mouth daily with breakfast. Take Xarelto for two and a half more weeks following discharge from the hospital, then discontinue Xarelto. Once the patient has completed the blood thinner regimen, then take a Baby 81 mg Aspirin daily for three more weeks. (Patient not taking: Reported on 01/17/2019) 19 tablet 0  . simvastatin (ZOCOR) 40 MG tablet TAKE 1 TABLET BY MOUTH AT BEDTIME FOR CHOLESTEROL 30 tablet 0  . tamsulosin (FLOMAX) 0.4 MG CAPS capsule Take 0.4 mg daily after breakfast by mouth.    . traMADol (ULTRAM) 50 MG tablet Take 1 tablet (50 mg total) by mouth every 6 (six) hours as needed for moderate pain. (Patient not taking: Reported on 01/17/2019) 20 tablet 0  . triamterene-hydrochlorothiazide (DYAZIDE) 37.5-25 MG capsule TAKE 1 CAPSULE BY MOUTH DAILY 90 capsule 1   No current facility-administered medications for this  visit.    No Known Allergies  Subjective:  Patient presents for session, sharing progress.  He stated that he went on a date recently, that it was pleasant but ultimately will probably not have another one with her.  He stated that he continues to spend time with his friend group and he is the social outlet.  We continue to explore self beliefs that maintained past romantic relationships.  He identified more examples of being a "rescuer" and relationships.  Through guided discovery, he identified how this gives him a full sense of  security and wants to change how he approaches relationships.  Encouraged him to identify characteristics and other aspects of the type of person with which he feels he would be a good match long-term.  He identified some negative self talk related to his being overweight.  He stated that he is struggled to take steps with his diet as discussed but plans to follow through between sessions.  Provide support, encouragement while having patient identify specific steps to take towards making this change.  Diagnoses:    ICD-10-CM   1. Depression, unspecified depression type  F32.9     Plan: Patient is to use CBT, mindfulness and coping skills to help manage decrease symptoms associated with their diagnosis.    Patient to continue to make time to engage socially with friends.  Patient to take time to identify aspects of self that would allow him to engage in ultimately unsatisfying romantic relationships.   Long-term goal:   Reduce overall level, frequency, and intensity of the feelings of depression and anxiety 7/10 to a 0-2/10 in severity for at least 3 consecutive months.   Short-term goal:  Decrease "deflating, self-doubting" thinking style when thinking about his failed relationships Increase self insight into what he needs to change about himself to allow for more balanced, healthy romantic relationships Identify and follow through on steps toward increased independence such as finding his own residence and increased financial stability  Utilize coping skills as discussed in session   Assessment of progress:  progressing   [name] Session, [facility]

## 2020-06-27 ENCOUNTER — Ambulatory Visit: Payer: BC Managed Care – PPO | Admitting: Mental Health

## 2020-07-15 ENCOUNTER — Ambulatory Visit (INDEPENDENT_AMBULATORY_CARE_PROVIDER_SITE_OTHER): Payer: BC Managed Care – PPO | Admitting: Mental Health

## 2020-07-15 ENCOUNTER — Other Ambulatory Visit: Payer: Self-pay

## 2020-07-15 DIAGNOSIS — F32A Depression, unspecified: Secondary | ICD-10-CM

## 2020-07-15 DIAGNOSIS — F329 Major depressive disorder, single episode, unspecified: Secondary | ICD-10-CM | POA: Diagnosis not present

## 2020-07-15 NOTE — Progress Notes (Signed)
[facility]  Name: MERTON WADLOW Date: 07/15/20 MRN: [id number] DOB: December 23, 1955 PCP: Patient, No Pcp Per  Time spent: 53 minutes Treatment: individual therapy   Mental Status Exam:   Appearance:   Casual     Behavior:  Appropriate  Motor:  Normal  Speech/Language:   Clear and Coherent  Affect:  Constricted  Mood:  anxious and depressed  Thought process:  normal  Thought content:    WNL  Sensory/Perceptual disturbances:    none  Orientation:  x4  Attention:  Good  Concentration:  Good  Memory:  WNL  Fund of knowledge:   Good  Insight:    Good  Judgment:   Good  Impulse Control:  Good   Reported Symptoms:  Depressed mood, anxious, negative self talk  Risk Assessment: Danger to Self:  No Self-injurious Behavior: No Danger to Others: No Duty to Warn:no Physical Aggression / Violence:No  Access to Firearms a concern: No  Gang Involvement:No  Patient / guardian was educated about steps to take if suicide or homicide risk level increases between visits: yes While future psychiatric events cannot be accurately predicted, the patient does not currently require acute inpatient psychiatric care and does not currently meet [facility] involuntary commitment criteria.  Medications: Current Outpatient Medications  Medication Sig Dispense Refill  . baclofen (LIORESAL) 10 MG tablet Take 10 mg 3 (three) times daily as needed by mouth for muscle spasms.    . cloNIDine (CATAPRES) 0.3 MG tablet TAKE 1 TABLET(0.3 MG) BY MOUTH DAILY 90 tablet 1  . escitalopram (LEXAPRO) 10 MG tablet Take 1 tablet (10 mg total) by mouth daily. 90 tablet 1  . finasteride (PROSCAR) 5 MG tablet Take 1 tablet (5 mg total) by mouth daily. 30 tablet 1  . gabapentin (NEURONTIN) 300 MG capsule Take 1 capsule (300 mg total) by mouth 3 (three) times daily. Gabapentin 300 mg Protocol Take a 300 mg capsule three times a day for one week, Then a 300 mg capsule twice a day for one week, Then a 300  mg capsule once a day for one week, then discontinue the Gabapentin. (Patient not taking: Reported on 01/17/2019) 42 capsule 0  . methocarbamol (ROBAXIN) 500 MG tablet Take 1 tablet (500 mg total) by mouth every 6 (six) hours as needed for muscle spasms. 80 tablet 0  . oxyCODONE (OXY IR/ROXICODONE) 5 MG immediate release tablet Take 1 tablet (5 mg total) by mouth every 6 (six) hours as needed for moderate pain or severe pain. (Patient not taking: Reported on 01/17/2019) 80 tablet 0  . rivaroxaban (XARELTO) 10 MG TABS tablet Take 1 tablet (10 mg total) by mouth daily with breakfast. Take Xarelto for two and a half more weeks following discharge from the hospital, then discontinue Xarelto. Once the patient has completed the blood thinner regimen, then take a Baby 81 mg Aspirin daily for three more weeks. (Patient not taking: Reported on 01/17/2019) 19 tablet 0  . simvastatin (ZOCOR) 40 MG tablet TAKE 1 TABLET BY MOUTH AT BEDTIME FOR CHOLESTEROL 30 tablet 0  . tamsulosin (FLOMAX) 0.4 MG CAPS capsule Take 0.4 mg daily after breakfast by mouth.    . traMADol (ULTRAM) 50 MG tablet Take 1 tablet (50 mg total) by mouth every 6 (six) hours as needed for moderate pain. (Patient not taking: Reported on 01/17/2019) 20 tablet 0  . triamterene-hydrochlorothiazide (DYAZIDE) 37.5-25 MG capsule TAKE 1 CAPSULE BY MOUTH DAILY 90 capsule 1   No current facility-administered medications for this  visit.    No Known Allergies  Subjective:  Patient presents on time for session in no distress.  Shared progress, how he continues to live with his sister.  His house is to be completed in about 6 months and looks forward to having his own residence.  He stated he has friendships where he will spend time with a group of friends but they are younger than he has and would like to have friends more around his own age.  Shared how he ruminates, not wanting to have friends, about his son.  He stated his son recently was reprimanded at  work due to not being fast enough with his work.  He stated he ultimately would like his son to find a different job, worries about his son's future in some ways, having a fulfilling life etc.  Engaged in problem solving with patient to identify ways to meet others, plans to follow through with ideas identified today.  He stated his daughter is soon to move out of town and this conflicts with his work schedule.  He is working out of town in the coming weeks and he identified how to handle the situation by helping her financially with the move.  He is struggled to lose weight as discussed last session.  He stated that he has been "on and off" with his diet but plans to be more consistent and strict.  He correlates his being overweight to being unsatisfied with his self-image and increases critical self-conscious feelings.  Facilitated his identifying steps he is taking to make identified changes, such as toward independence, and understanding himself to be ready to cultivate healthy relationships.  Interventions: CBT, supportive therapy, problem solving  Diagnoses:    ICD-10-CM   1. Depression, unspecified depression type  F32.9     Plan: Patient is to use CBT, mindfulness and coping skills to help manage decrease symptoms associated with their diagnosis.    Patient to continue to make time to engage socially with friends.  Patient to take time to identify aspects of self that would allow him to engage in ultimately unsatisfying romantic relationships.    Long-term goal:   Reduce overall level, frequency, and intensity of the feelings of depression and anxiety 7/10 to a 0-2/10 in severity for at least 3 consecutive months.   Short-term goal:  Decrease "deflating, self-doubting" thinking style when thinking about his failed relationships Increase self insight into what he needs to change about himself to allow for more balanced, healthy romantic relationships Identify and follow through on steps  toward increased independence such as finding his own residence and increased financial stability  Utilize coping skills as discussed in session   Assessment of progress:  progressing   [name] Session, [facility]

## 2020-08-16 ENCOUNTER — Other Ambulatory Visit: Payer: Self-pay

## 2020-08-16 ENCOUNTER — Telehealth: Payer: Self-pay | Admitting: Adult Health

## 2020-08-16 DIAGNOSIS — F32A Depression, unspecified: Secondary | ICD-10-CM

## 2020-08-16 DIAGNOSIS — F411 Generalized anxiety disorder: Secondary | ICD-10-CM

## 2020-08-16 MED ORDER — ESCITALOPRAM OXALATE 10 MG PO TABS: 10.0000 mg | ORAL_TABLET | Freq: Every day | ORAL | 0 refills | Status: DC

## 2020-08-16 NOTE — Telephone Encounter (Signed)
Rx sent 

## 2020-08-16 NOTE — Telephone Encounter (Signed)
Pt called and needs a refill for Lexapro sent to [facility] in [place], [place] for this one time only. He is visiting family in [place] [facility] [place] [facility] for a week. [facility] # is [phone].

## 2020-08-26 ENCOUNTER — Ambulatory Visit: Payer: BC Managed Care – PPO | Admitting: Mental Health

## 2020-09-17 ENCOUNTER — Ambulatory Visit (INDEPENDENT_AMBULATORY_CARE_PROVIDER_SITE_OTHER): Payer: BC Managed Care – PPO | Admitting: Mental Health

## 2020-09-17 ENCOUNTER — Other Ambulatory Visit: Payer: Self-pay

## 2020-09-17 DIAGNOSIS — F32A Depression, unspecified: Secondary | ICD-10-CM

## 2020-09-17 DIAGNOSIS — F411 Generalized anxiety disorder: Secondary | ICD-10-CM

## 2020-09-17 NOTE — Progress Notes (Signed)
[facility]  Name: Jason Ingram Date: 09/17/20 MRN: [id number] DOB: 08-03-1956 PCP: Patient, No Pcp Per  Time spent: 53 minutes Treatment: individual therapy    Mental Status Exam:   Appearance:   Casual     Behavior:  Appropriate  Motor:  Normal  Speech/Language:   Clear and Coherent  Affect:  Constricted  Mood:  anxious and depressed  Thought process:  normal  Thought content:    WNL  Sensory/Perceptual disturbances:    none  Orientation:  x4  Attention:  Good  Concentration:  Good  Memory:  WNL  Fund of knowledge:   Good  Insight:    Good  Judgment:   Good  Impulse Control:  Good   Reported Symptoms:  Depressed mood, anxious, negative self talk  Risk Assessment: Danger to Self:  No Self-injurious Behavior: No Danger to Others: No Duty to Warn:no Physical Aggression / Violence:No  Access to Firearms a concern: No  Gang Involvement:No  Patient / guardian was educated about steps to take if suicide or homicide risk level increases between visits: yes While future psychiatric events cannot be accurately predicted, the patient does not currently require acute inpatient psychiatric care and does not currently meet [facility] involuntary commitment criteria.  Medications: Current Outpatient Medications  Medication Sig Dispense Refill  . baclofen (LIORESAL) 10 MG tablet Take 10 mg 3 (three) times daily as needed by mouth for muscle spasms.    . cloNIDine (CATAPRES) 0.3 MG tablet TAKE 1 TABLET(0.3 MG) BY MOUTH DAILY 90 tablet 1  . escitalopram (LEXAPRO) 10 MG tablet Take 1 tablet (10 mg total) by mouth daily. 90 tablet 0  . finasteride (PROSCAR) 5 MG tablet Take 1 tablet (5 mg total) by mouth daily. 30 tablet 1  . gabapentin (NEURONTIN) 300 MG capsule Take 1 capsule (300 mg total) by mouth 3 (three) times daily. Gabapentin 300 mg Protocol Take a 300 mg capsule three times a day for one week, Then a 300 mg capsule twice a day for one week, Then a  300 mg capsule once a day for one week, then discontinue the Gabapentin. (Patient not taking: Reported on 01/17/2019) 42 capsule 0  . methocarbamol (ROBAXIN) 500 MG tablet Take 1 tablet (500 mg total) by mouth every 6 (six) hours as needed for muscle spasms. 80 tablet 0  . oxyCODONE (OXY IR/ROXICODONE) 5 MG immediate release tablet Take 1 tablet (5 mg total) by mouth every 6 (six) hours as needed for moderate pain or severe pain. (Patient not taking: Reported on 01/17/2019) 80 tablet 0  . rivaroxaban (XARELTO) 10 MG TABS tablet Take 1 tablet (10 mg total) by mouth daily with breakfast. Take Xarelto for two and a half more weeks following discharge from the hospital, then discontinue Xarelto. Once the patient has completed the blood thinner regimen, then take a Baby 81 mg Aspirin daily for three more weeks. (Patient not taking: Reported on 01/17/2019) 19 tablet 0  . simvastatin (ZOCOR) 40 MG tablet TAKE 1 TABLET BY MOUTH AT BEDTIME FOR CHOLESTEROL 30 tablet 0  . tamsulosin (FLOMAX) 0.4 MG CAPS capsule Take 0.4 mg daily after breakfast by mouth.    . traMADol (ULTRAM) 50 MG tablet Take 1 tablet (50 mg total) by mouth every 6 (six) hours as needed for moderate pain. (Patient not taking: Reported on 01/17/2019) 20 tablet 0  . triamterene-hydrochlorothiazide (DYAZIDE) 37.5-25 MG capsule TAKE 1 CAPSULE BY MOUTH DAILY 90 capsule 1   No current facility-administered medications for  this visit.    No Known Allergies  Subjective:  Patient presents on time for today's session. Shared progress since last visit which was about 2 months ago. He stated that he was busy with work, out of town, upset that he had gained weight during this time, identified the negative cognition "I knew that I shouldn't have eaten like I did, now I got to lose all this weight". Through discussion, patient shared that he had a daily stipend for food and the temptation to eat out was great. Assisted him in refocusing his thoughts toward  change he presently would like to take steps toward. He stated that he wants to again walking exercise and increase from there. He stated he knows how to eat healthy to lose weight has done this before in the past. We discussed the significance of using a calendar to schedule his exercise regimen to increase consistency and motivation. Patient verbalized understanding and plans to follow through. He also continues to identify the need to make more friends, identified the need to be more social. Also, identified worries and fears of growing old and being alone. Provide support and understanding while also continuing to facilitate patient toward identifying calming, self affirming self talk where he was able to state "I'm going to get in shape, exercise, eat healthy and I will feel better".  Interventions: CBT, supportive therapy, problem solving  Diagnoses:    ICD-10-CM   1. Depression, unspecified depression type  F32.A   2. Generalized anxiety disorder  F41.1     Plan: Patient is to use CBT, mindfulness and coping skills to help manage decrease symptoms associated with their diagnosis.    Patient to continue to make time to engage socially with friends.  Patient to take time to identify aspects of self that would allow him to engage in ultimately unsatisfying romantic relationships.    Long-term goal:   Reduce overall level, frequency, and intensity of the feelings of depression and anxiety 7/10 to a 0-2/10 in severity for at least 3 consecutive months.   Short-term goal:  Decrease "deflating, self-doubting" thinking style when thinking about his failed relationships Increase self insight into what he needs to change about himself to allow for more balanced, healthy romantic relationships Identify and follow through on steps toward increased independence such as finding his own residence and increased financial stability  Utilize coping skills as discussed in session   Assessment of progress:   progressing   [name] Session, [facility]

## 2020-10-16 ENCOUNTER — Ambulatory Visit: Payer: BC Managed Care – PPO | Admitting: Mental Health

## 2020-11-05 ENCOUNTER — Ambulatory Visit (INDEPENDENT_AMBULATORY_CARE_PROVIDER_SITE_OTHER): Payer: BC Managed Care – PPO | Admitting: Mental Health

## 2020-11-05 ENCOUNTER — Other Ambulatory Visit: Payer: Self-pay

## 2020-11-05 DIAGNOSIS — F32A Depression, unspecified: Secondary | ICD-10-CM

## 2020-11-05 NOTE — Progress Notes (Addendum)
[facility]  Name: Jason Ingram Date: 11/05/20 MRN: [id number] DOB: August 07, 1956 PCP: Patient, No Pcp Per  Time spent: 53 minutes Treatment: individual therapy    Mental Status Exam:   Appearance:   Casual     Behavior:  Appropriate  Motor:  Normal  Speech/Language:   Clear and Coherent  Affect:  Constricted  Mood:  anxious and depressed  Thought process:  normal  Thought content:    WNL  Sensory/Perceptual disturbances:    none  Orientation:  x4  Attention:  Good  Concentration:  Good  Memory:  WNL  Fund of knowledge:   Good  Insight:    Good  Judgment:   Good  Impulse Control:  Good   Reported Symptoms:  Depressed mood, anxiety, negative self talk  Risk Assessment: Danger to Self:  No Self-injurious Behavior: No Danger to Others: No Duty to Warn:no Physical Aggression / Violence:No  Access to Firearms a concern: No  Gang Involvement:No  Patient / guardian was educated about steps to take if suicide or homicide risk level increases between visits: yes While future psychiatric events cannot be accurately predicted, the patient does not currently require acute inpatient psychiatric care and does not currently meet [facility] involuntary commitment criteria.  Medications: Current Outpatient Medications  Medication Sig Dispense Refill  . baclofen (LIORESAL) 10 MG tablet Take 10 mg 3 (three) times daily as needed by mouth for muscle spasms.    . cloNIDine (CATAPRES) 0.3 MG tablet TAKE 1 TABLET(0.3 MG) BY MOUTH DAILY 90 tablet 1  . escitalopram (LEXAPRO) 10 MG tablet Take 1 tablet (10 mg total) by mouth daily. 90 tablet 0  . finasteride (PROSCAR) 5 MG tablet Take 1 tablet (5 mg total) by mouth daily. 30 tablet 1  . gabapentin (NEURONTIN) 300 MG capsule Take 1 capsule (300 mg total) by mouth 3 (three) times daily. Gabapentin 300 mg Protocol Take a 300 mg capsule three times a day for one week, Then a 300 mg capsule twice a day for one week, Then a  300 mg capsule once a day for one week, then discontinue the Gabapentin. (Patient not taking: Reported on 01/17/2019) 42 capsule 0  . methocarbamol (ROBAXIN) 500 MG tablet Take 1 tablet (500 mg total) by mouth every 6 (six) hours as needed for muscle spasms. 80 tablet 0  . oxyCODONE (OXY IR/ROXICODONE) 5 MG immediate release tablet Take 1 tablet (5 mg total) by mouth every 6 (six) hours as needed for moderate pain or severe pain. (Patient not taking: Reported on 01/17/2019) 80 tablet 0  . rivaroxaban (XARELTO) 10 MG TABS tablet Take 1 tablet (10 mg total) by mouth daily with breakfast. Take Xarelto for two and a half more weeks following discharge from the hospital, then discontinue Xarelto. Once the patient has completed the blood thinner regimen, then take a Baby 81 mg Aspirin daily for three more weeks. (Patient not taking: Reported on 01/17/2019) 19 tablet 0  . simvastatin (ZOCOR) 40 MG tablet TAKE 1 TABLET BY MOUTH AT BEDTIME FOR CHOLESTEROL 30 tablet 0  . tamsulosin (FLOMAX) 0.4 MG CAPS capsule Take 0.4 mg daily after breakfast by mouth.    . traMADol (ULTRAM) 50 MG tablet Take 1 tablet (50 mg total) by mouth every 6 (six) hours as needed for moderate pain. (Patient not taking: Reported on 01/17/2019) 20 tablet 0  . triamterene-hydrochlorothiazide (DYAZIDE) 37.5-25 MG capsule TAKE 1 CAPSULE BY MOUTH DAILY 90 capsule 1   No current facility-administered medications for  this visit.    No Known Allergies  Subjective:  Patient presents for session on time. He shared recent events in progress. He continues to share how he feels lonely, wants to meet others. He said he is taking some steps in this area, has been going to a singles church group weekly and also spent some time with two other friends recently. He continues to identify the fear of growing old and being alone. He continues to live at his sister's house while the construction on his home which is being built currently is to be completed in the  next few months. Through God and discovery, he identify the need to be mindful of his expectations as he stated "I just need to work on being okay with myself". We discussed the concept of radical acceptance where he agreed to further review and seek opportunities to practice between sessions.   Interventions: CBT, supportive therapy, problem solving  Diagnoses:  No diagnosis found.  Plan: Patient is to use CBT, mindfulness and coping skills to help manage decrease symptoms associated with their diagnosis.    Patient to continue to make time to engage socially with friends.  Patient to take time to identify aspects of self that would allow him to engage in ultimately unsatisfying romantic relationships.    Long-term goal:   Reduce overall level, frequency, and intensity of the feelings of depression and anxiety 7/10 to a 0-2/10 in severity for at least 3 consecutive months.   Short-term goal:  Decrease "deflating, self-doubting" thinking style when thinking about his failed relationships Increase self insight into what he needs to change about himself to allow for more balanced, healthy romantic relationships Identify and follow through on steps toward increased independence such as finding his own residence and increased financial stability  Utilize coping skills as discussed in session   Assessment of progress:  progressing   [name] Session, [facility]

## 2020-12-23 ENCOUNTER — Ambulatory Visit: Payer: BC Managed Care – PPO | Admitting: Mental Health

## 2021-01-02 ENCOUNTER — Ambulatory Visit (INDEPENDENT_AMBULATORY_CARE_PROVIDER_SITE_OTHER): Payer: BC Managed Care – PPO | Admitting: Mental Health

## 2021-01-02 ENCOUNTER — Other Ambulatory Visit: Payer: Self-pay

## 2021-01-02 DIAGNOSIS — F32A Depression, unspecified: Secondary | ICD-10-CM | POA: Diagnosis not present

## 2021-01-02 NOTE — Progress Notes (Signed)
[facility]  Name: Jason Ingram Date: 01/02/21 MRN: [id number] DOB: [birth year] PCP: Patient, No Pcp Per  Time spent: 53 minutes Treatment: individual therapy    Mental Status Exam:   Appearance:   Casual     Behavior:  Appropriate  Motor:  Normal  Speech/Language:   Clear and Coherent  Affect:  Constricted  Mood:  anxious and depressed  Thought process:  normal  Thought content:    WNL  Sensory/Perceptual disturbances:    none  Orientation:  x4  Attention:  Good  Concentration:  Good  Memory:  WNL  Fund of knowledge:   Good  Insight:    Good  Judgment:   Good  Impulse Control:  Good   Reported Symptoms:  Depressed mood, anxiety, negative self talk  Risk Assessment: Danger to Self:  No Self-injurious Behavior: No Danger to Others: No Duty to Warn:no Physical Aggression / Violence:No  Access to Firearms a concern: No  Gang Involvement:No  Patient / guardian was educated about steps to take if suicide or homicide risk level increases between visits: yes While future psychiatric events cannot be accurately predicted, the patient does not currently require acute inpatient psychiatric care and does not currently meet [facility] involuntary commitment criteria.  Medications: Current Outpatient Medications  Medication Sig Dispense Refill  . baclofen (LIORESAL) 10 MG tablet Take 10 mg 3 (three) times daily as needed by mouth for muscle spasms.    . cloNIDine (CATAPRES) 0.3 MG tablet TAKE 1 TABLET(0.3 MG) BY MOUTH DAILY 90 tablet 1  . escitalopram (LEXAPRO) 10 MG tablet Take 1 tablet (10 mg total) by mouth daily. 90 tablet 0  . finasteride (PROSCAR) 5 MG tablet Take 1 tablet (5 mg total) by mouth daily. 30 tablet 1  . gabapentin (NEURONTIN) 300 MG capsule Take 1 capsule (300 mg total) by mouth 3 (three) times daily. Gabapentin 300 mg Protocol Take a 300 mg capsule three times a day for one week, Then a 300 mg capsule twice a day for one week, Then a  300 mg capsule once a day for one week, then discontinue the Gabapentin. (Patient not taking: Reported on 01/17/2019) 42 capsule 0  . methocarbamol (ROBAXIN) 500 MG tablet Take 1 tablet (500 mg total) by mouth every 6 (six) hours as needed for muscle spasms. 80 tablet 0  . oxyCODONE (OXY IR/ROXICODONE) 5 MG immediate release tablet Take 1 tablet (5 mg total) by mouth every 6 (six) hours as needed for moderate pain or severe pain. (Patient not taking: Reported on 01/17/2019) 80 tablet 0  . rivaroxaban (XARELTO) 10 MG TABS tablet Take 1 tablet (10 mg total) by mouth daily with breakfast. Take Xarelto for two and a half more weeks following discharge from the hospital, then discontinue Xarelto. Once the patient has completed the blood thinner regimen, then take a Baby 81 mg Aspirin daily for three more weeks. (Patient not taking: Reported on 01/17/2019) 19 tablet 0  . simvastatin (ZOCOR) 40 MG tablet TAKE 1 TABLET BY MOUTH AT BEDTIME FOR CHOLESTEROL 30 tablet 0  . tamsulosin (FLOMAX) 0.4 MG CAPS capsule Take 0.4 mg daily after breakfast by mouth.    . traMADol (ULTRAM) 50 MG tablet Take 1 tablet (50 mg total) by mouth every 6 (six) hours as needed for moderate pain. (Patient not taking: Reported on 01/17/2019) 20 tablet 0  . triamterene-hydrochlorothiazide (DYAZIDE) 37.5-25 MG capsule TAKE 1 CAPSULE BY MOUTH DAILY 90 capsule 1   No current facility-administered medications for  this visit.    No Known Allergies  Subjective:  Patient presents for session on time.  Shared events since our last visit where he continues to live with his sister and her husband.  He stated that the house he has being built will be ready in May, upset as the deadline got pushed back 2 months.  He identifies wanting more independence and living in his sister's basement is not helpful for his self-esteem.  He went on to share how he needs a romantic relationship in his life, plans to follow through with meeting others to dating  applications.  He continues to have social support with some friends and church group.  He stated there is 1 lady he would like to talk to in the church group and plans to follow through between sessions, "I think I will ask her out for dinner, got a try something" referring to his feeling a lack of confidence.  Through guided discovery, patient identified the need to lose weight as a significant step toward increasing his confidence.  He plans to adhere to his dietary regimen.    Interventions: CBT, supportive therapy, problem solving  Diagnoses:    ICD-10-CM   1. Depression, unspecified depression type  F32.A     Plan: Patient is to use CBT, mindfulness and coping skills to help manage decrease symptoms associated with their diagnosis.    Patient to continue to make time to engage socially with friends.  Patient to take time to identify aspects of self that would allow him to engage in ultimately unsatisfying romantic relationships.    Long-term goal:   Reduce overall level, frequency, and intensity of the feelings of depression and anxiety 7/10 to a 0-2/10 in severity for at least 3 consecutive months.   Short-term goal:  Decrease "deflating, self-doubting" thinking style when thinking about his failed relationships Increase self insight into what he needs to change about himself to allow for more balanced, healthy romantic relationships Identify and follow through on steps toward increased independence such as finding his own residence and increased financial stability  Utilize coping skills as discussed in session   Assessment of progress:  progressing   [name] Session, [facility]

## 2021-01-27 ENCOUNTER — Ambulatory Visit: Payer: BC Managed Care – PPO | Admitting: Mental Health

## 2021-02-06 ENCOUNTER — Other Ambulatory Visit: Payer: Self-pay

## 2021-02-06 ENCOUNTER — Ambulatory Visit (INDEPENDENT_AMBULATORY_CARE_PROVIDER_SITE_OTHER): Payer: BC Managed Care – PPO | Admitting: Mental Health

## 2021-02-06 DIAGNOSIS — F32A Depression, unspecified: Secondary | ICD-10-CM | POA: Diagnosis not present

## 2021-02-06 NOTE — Progress Notes (Incomplete)
[facility]  Name: Jason Ingram Date: 02/06/21 MRN: [id number] DOB: 02-13-1956 PCP: Patient, No Pcp Per  Time spent: 54 minutes Treatment: individual therapy    Mental Status Exam:   Appearance:   Casual     Behavior:  Appropriate  Motor:  Normal  Speech/Language:   Clear and Coherent  Affect:  Constricted  Mood:  anxious and depressed  Thought process:  normal  Thought content:    WNL  Sensory/Perceptual disturbances:    none  Orientation:  x4  Attention:  Good  Concentration:  Good  Memory:  WNL  Fund of knowledge:   Good  Insight:    Good  Judgment:   Good  Impulse Control:  Good   Reported Symptoms:  Depressed mood, anxiety, negative self talk  Risk Assessment: Danger to Self:  No Self-injurious Behavior: No Danger to Others: No Duty to Warn:no Physical Aggression / Violence:No  Access to Firearms a concern: No  Gang Involvement:No  Patient / guardian was educated about steps to take if suicide or homicide risk level increases between visits: yes While future psychiatric events cannot be accurately predicted, the patient does not currently require acute inpatient psychiatric care and does not currently meet [facility] involuntary commitment criteria.  Medications: Current Outpatient Medications  Medication Sig Dispense Refill  . baclofen (LIORESAL) 10 MG tablet Take 10 mg 3 (three) times daily as needed by mouth for muscle spasms.    . cloNIDine (CATAPRES) 0.3 MG tablet TAKE 1 TABLET(0.3 MG) BY MOUTH DAILY 90 tablet 1  . escitalopram (LEXAPRO) 10 MG tablet Take 1 tablet (10 mg total) by mouth daily. 90 tablet 0  . finasteride (PROSCAR) 5 MG tablet Take 1 tablet (5 mg total) by mouth daily. 30 tablet 1  . gabapentin (NEURONTIN) 300 MG capsule Take 1 capsule (300 mg total) by mouth 3 (three) times daily. Gabapentin 300 mg Protocol Take a 300 mg capsule three times a day for one week, Then a 300 mg capsule twice a day for one week, Then a  300 mg capsule once a day for one week, then discontinue the Gabapentin. (Patient not taking: Reported on 01/17/2019) 42 capsule 0  . methocarbamol (ROBAXIN) 500 MG tablet Take 1 tablet (500 mg total) by mouth every 6 (six) hours as needed for muscle spasms. 80 tablet 0  . oxyCODONE (OXY IR/ROXICODONE) 5 MG immediate release tablet Take 1 tablet (5 mg total) by mouth every 6 (six) hours as needed for moderate pain or severe pain. (Patient not taking: Reported on 01/17/2019) 80 tablet 0  . rivaroxaban (XARELTO) 10 MG TABS tablet Take 1 tablet (10 mg total) by mouth daily with breakfast. Take Xarelto for two and a half more weeks following discharge from the hospital, then discontinue Xarelto. Once the patient has completed the blood thinner regimen, then take a Baby 81 mg Aspirin daily for three more weeks. (Patient not taking: Reported on 01/17/2019) 19 tablet 0  . simvastatin (ZOCOR) 40 MG tablet TAKE 1 TABLET BY MOUTH AT BEDTIME FOR CHOLESTEROL 30 tablet 0  . tamsulosin (FLOMAX) 0.4 MG CAPS capsule Take 0.4 mg daily after breakfast by mouth.    . traMADol (ULTRAM) 50 MG tablet Take 1 tablet (50 mg total) by mouth every 6 (six) hours as needed for moderate pain. (Patient not taking: Reported on 01/17/2019) 20 tablet 0  . triamterene-hydrochlorothiazide (DYAZIDE) 37.5-25 MG capsule TAKE 1 CAPSULE BY MOUTH DAILY 90 capsule 1   No current facility-administered medications for  this visit.    No Known Allergies  Subjective:  Patient presents for session on time.    Shared events since our last visit where he continues to live with his sister and her husband.  He stated that the house he has being built will be ready in May, upset as the deadline got pushed back 2 months.  He identifies wanting more independence and living in his sister's basement is not helpful for his self-esteem.  He went on to share how he needs a romantic relationship in his life, plans to follow through with meeting others to dating  applications.  He continues to have social support with some friends and church group.  He stated there is 1 lady he would like to talk to in the church group and plans to follow through between sessions, "I think I will ask her out for dinner, got a try something" referring to his feeling a lack of confidence.  Through guided discovery, patient identified the need to lose weight as a significant step toward increasing his confidence.  He plans to adhere to his dietary regimen.    Interventions: CBT, supportive therapy, problem solving  Diagnoses:    ICD-10-CM   1. Depression, unspecified depression type  F32.A     Plan: Patient is to use CBT, mindfulness and coping skills to help manage decrease symptoms associated with their diagnosis.    Patient to continue to make time to engage socially with friends.  Patient to take time to identify aspects of self that would allow him to engage in ultimately unsatisfying romantic relationships.    Long-term goal:   Reduce overall level, frequency, and intensity of the feelings of depression and anxiety 7/10 to a 0-2/10 in severity for at least 3 consecutive months.   Short-term goal:  Decrease "deflating, self-doubting" thinking style when thinking about his failed relationships Increase self insight into what he needs to change about himself to allow for more balanced, healthy romantic relationships Identify and follow through on steps toward increased independence such as finding his own residence and increased financial stability  Utilize coping skills as discussed in session   Assessment of progress:  progressing   [name] Session, [facility]

## 2021-02-12 ENCOUNTER — Other Ambulatory Visit: Payer: Self-pay

## 2021-02-12 DIAGNOSIS — F411 Generalized anxiety disorder: Secondary | ICD-10-CM

## 2021-02-12 DIAGNOSIS — F32A Depression, unspecified: Secondary | ICD-10-CM

## 2021-02-12 MED ORDER — ESCITALOPRAM OXALATE 10 MG PO TABS: 10.0000 mg | ORAL_TABLET | Freq: Every day | ORAL | 0 refills | Status: DC

## 2021-02-18 ENCOUNTER — Ambulatory Visit (INDEPENDENT_AMBULATORY_CARE_PROVIDER_SITE_OTHER): Payer: BC Managed Care – PPO | Admitting: Pulmonary Disease

## 2021-02-18 ENCOUNTER — Encounter: Payer: Self-pay | Admitting: Pulmonary Disease

## 2021-02-18 ENCOUNTER — Other Ambulatory Visit: Payer: Self-pay

## 2021-02-18 VITALS — BP 122/74 | HR 72 | Temp 97.8°F | Ht 76.0 in | Wt 301.6 lb

## 2021-02-18 DIAGNOSIS — G4733 Obstructive sleep apnea (adult) (pediatric): Secondary | ICD-10-CM | POA: Diagnosis not present

## 2021-02-18 NOTE — Patient Instructions (Signed)
History of severe obstructive sleep apnea with mission becoming dysfunctional  We will schedule you for home sleep study Update your results as soon as reviewed  Initiate CPAP therapy to your medical supply company once we have results  Continue using your current CPAP  Call with significant concerns  Follow-up in 3 to 4 months

## 2021-02-18 NOTE — Progress Notes (Signed)
Jason Ingram    [id number]    [birth year]  Primary Care Physician:Rhyne, Katheran Awe, MD  Referring Physician: No referring provider defined for this encounter.  Chief complaint:   Patient with a history of obstructive sleep apnea Machine is becoming dysfunctional  HPI:  Machine has become noisier  He still uses his machine on a nightly basis, feels he benefits from using the machine on a nightly basis  He was diagnosed with severe obstructive sleep apnea over 5 years ago and has been using CPAP since At some point managed to lose a lot of weight over 60-70 pounds and has gained it all back  He usually feels well when he wakes up in the morning Usually goes to bed between 10 and 11 PM, takes about 20 minutes to fall asleep About 2 awakenings Final wake up time about 7 AM  He feels well generally He has high blood pressure, hypercholesterolemia well controlled  He is active Never smoker  No pertinent occupational history   Divorced  Outpatient Encounter Medications as of 02/18/2021  Medication Sig  . cloNIDine (CATAPRES) 0.3 MG tablet TAKE 1 TABLET(0.3 MG) BY MOUTH DAILY  . escitalopram (LEXAPRO) 10 MG tablet Take 1 tablet (10 mg total) by mouth daily.  . finasteride (PROSCAR) 5 MG tablet Take 1 tablet (5 mg total) by mouth daily.  Marland Kitchen lisinopril (ZESTRIL) 10 MG tablet Take 10 mg by mouth daily.  Marland Kitchen oxybutynin (DITROPAN-XL) 10 MG 24 hr tablet Take 10 mg by mouth at bedtime.  . tamsulosin (FLOMAX) 0.4 MG CAPS capsule Take 0.4 mg daily after breakfast by mouth.  . triamterene-hydrochlorothiazide (DYAZIDE) 37.5-25 MG capsule TAKE 1 CAPSULE BY MOUTH DAILY  . [DISCONTINUED] baclofen (LIORESAL) 10 MG tablet Take 10 mg 3 (three) times daily as needed by mouth for muscle spasms.  . [DISCONTINUED] gabapentin (NEURONTIN) 300 MG capsule Take 1 capsule (300 mg total) by mouth 3 (three) times daily. Gabapentin 300 mg Protocol Take a 300 mg capsule three times a day for one  week, Then a 300 mg capsule twice a day for one week, Then a 300 mg capsule once a day for one week, then discontinue the Gabapentin. (Patient not taking: Reported on 01/17/2019)  . [DISCONTINUED] methocarbamol (ROBAXIN) 500 MG tablet Take 1 tablet (500 mg total) by mouth every 6 (six) hours as needed for muscle spasms.  . [DISCONTINUED] oxyCODONE (OXY IR/ROXICODONE) 5 MG immediate release tablet Take 1 tablet (5 mg total) by mouth every 6 (six) hours as needed for moderate pain or severe pain. (Patient not taking: Reported on 01/17/2019)  . [DISCONTINUED] rivaroxaban (XARELTO) 10 MG TABS tablet Take 1 tablet (10 mg total) by mouth daily with breakfast. Take Xarelto for two and a half more weeks following discharge from the hospital, then discontinue Xarelto. Once the patient has completed the blood thinner regimen, then take a Baby 81 mg Aspirin daily for three more weeks. (Patient not taking: No sig reported)  . [DISCONTINUED] simvastatin (ZOCOR) 40 MG tablet TAKE 1 TABLET BY MOUTH AT BEDTIME FOR CHOLESTEROL  . [DISCONTINUED] traMADol (ULTRAM) 50 MG tablet Take 1 tablet (50 mg total) by mouth every 6 (six) hours as needed for moderate pain. (Patient not taking: Reported on 01/17/2019)   No facility-administered encounter medications on file as of 02/18/2021.    Allergies as of 02/18/2021  . (No Known Allergies)    Past Medical History:  Diagnosis Date  . Depression   .  High cholesterol   . History of broken nose   . History of pneumonia 1998  . Hx of colonoscopy 2013  . Hypertension   . Injury of pelvis 1997  . Sleep apnea     Past Surgical History:  Procedure Laterality Date  . COLONOSCOPY  05/2012   Dr. Carlisle Cater  . FRACTURE SURGERY    . HAND SURGERY  2000   [facility]  . JOINT REPLACEMENT    . KNEE SURGERY  12/07/1976   [facility]  . KNEE SURGERY  2007  . SKIN GRAFT  1962   [facility]   . TOTAL KNEE ARTHROPLASTY Left 11/08/2017   Procedure: LEFT TOTAL KNEE ARTHROPLASTY;  Surgeon:  Ollen Gross, MD;  Location: [facility] ORS;  Service: Orthopedics;  Laterality: Left;    Family History  Problem Relation Age of Onset  . Lymphoma Mother   . Bipolar disorder Son   . Heart disease Other        multiple    Social History   Socioeconomic History  . Marital status: Married    Spouse name: Not on file  . Number of children: Not on file  . Years of education: Not on file  . Highest education level: Not on file  Occupational History  . Occupation: Art gallery manager  Tobacco Use  . Smoking status: Never Smoker  . Smokeless tobacco: Never Used  Vaping Use  . Vaping Use: Never used  Substance and Sexual Activity  . Alcohol use: Yes    Alcohol/week: 1.0 standard drink    Types: 1 Standard drinks or equivalent per week    Comment: 4 drinks weekly  . Drug use: No  . Sexual activity: Not on file  Other Topics Concern  . Not on file  Social History Narrative   Diet: N/A   Caffeine: Yes    Married: Yes, 2016   House: House, 2 stories    Pets: 2 Dogs   Current/Past profession: Building services engineer    Exercise: Yes, daily   Living Will: No   DNR: No   POA/HPOA: No    Social Determinants of Corporate investment banker Strain: Not on file  Food Insecurity: Not on file  Transportation Needs: Not on file  Physical Activity: Not on file  Stress: Not on file  Social Connections: Not on file  Intimate Partner Violence: Not on file    Review of Systems  Respiratory: Positive for apnea. Negative for shortness of breath.   Psychiatric/Behavioral: Positive for sleep disturbance.    Vitals:   02/18/21 1439  BP: 122/74  Pulse: 72  Temp: 97.8 F (36.6 C)  SpO2: 97%     Physical Exam Constitutional:      Appearance: He is obese.  HENT:     Head: Normocephalic.     Nose: No congestion.     Mouth/Throat:     Mouth: Mucous membranes are moist.     Comments: Mallampati 4, crowded oropharynx Eyes:     Pupils: Pupils are equal, round, and reactive to light.   Cardiovascular:     Rate and Rhythm: Normal rate and regular rhythm.     Heart sounds: No murmur heard. No friction rub.  Pulmonary:     Effort: No respiratory distress.     Breath sounds: No stridor. No wheezing or rhonchi.  Musculoskeletal:     Cervical back: No rigidity or tenderness.  Neurological:     Mental Status: He is alert.  Psychiatric:  Mood and Affect: Mood normal.    Epworth Sleepiness Scale of 4  Data Reviewed: Compliance data reviewed showing 100% compliance Average use of 8 hours 31 minutes Is on CPAP of 10 AHI of 0.8  Assessment:  Severe obstructive sleep apnea -Compliant with CPAP use  Obesity  Pathophysiology of sleep disordered breathing reviewed   Plan/Recommendations: We will schedule the patient for a home sleep study  Updated results are similar reviewed Contact his DME company for an upgrade to his machine  Tentative follow-up in 3 to 4 months  Virl Diamond MD [facility] 02/18/2021, 2:56 PM  CC: No ref. provider found

## 2021-02-25 ENCOUNTER — Other Ambulatory Visit: Payer: Self-pay

## 2021-02-25 ENCOUNTER — Ambulatory Visit (INDEPENDENT_AMBULATORY_CARE_PROVIDER_SITE_OTHER): Payer: BC Managed Care – PPO | Admitting: Mental Health

## 2021-02-25 DIAGNOSIS — F32A Depression, unspecified: Secondary | ICD-10-CM | POA: Diagnosis not present

## 2021-02-25 NOTE — Progress Notes (Signed)
[facility]  Name: Jason Ingram Date:  02/25/21 MRN: [id number] DOB: Feb 10, 1956 PCP: Toula Moos, MD  Time spent: 53 minutes Treatment: individual therapy    Mental Status Exam:   Appearance:   Casual     Behavior:  Appropriate  Motor:  Normal  Speech/Language:   Clear and Coherent  Affect:  Constricted  Mood:  anxious and depressed  Thought process:  normal  Thought content:    WNL  Sensory/Perceptual disturbances:    none  Orientation:  x4  Attention:  Good  Concentration:  Good  Memory:  WNL  Fund of knowledge:   Good  Insight:    Good  Judgment:   Good  Impulse Control:  Good   Reported Symptoms:  Depressed mood, anxiety, negative self talk  Risk Assessment: Danger to Self:  No Self-injurious Behavior: No Danger to Others: No Duty to Warn:no Physical Aggression / Violence:No  Access to Firearms a concern: No  Gang Involvement:No  Patient / guardian was educated about steps to take if suicide or homicide risk level increases between visits: yes While future psychiatric events cannot be accurately predicted, the patient does not currently require acute inpatient psychiatric care and does not currently meet [facility] involuntary commitment criteria.  Medications: Current Outpatient Medications  Medication Sig Dispense Refill  . cloNIDine (CATAPRES) 0.3 MG tablet TAKE 1 TABLET(0.3 MG) BY MOUTH DAILY 90 tablet 1  . escitalopram (LEXAPRO) 10 MG tablet Take 1 tablet (10 mg total) by mouth daily. 90 tablet 0  . finasteride (PROSCAR) 5 MG tablet Take 1 tablet (5 mg total) by mouth daily. 30 tablet 1  . lisinopril (ZESTRIL) 10 MG tablet Take 10 mg by mouth daily.    Marland Kitchen oxybutynin (DITROPAN-XL) 10 MG 24 hr tablet Take 10 mg by mouth at bedtime.    . tamsulosin (FLOMAX) 0.4 MG CAPS capsule Take 0.4 mg daily after breakfast by mouth.    . triamterene-hydrochlorothiazide (DYAZIDE) 37.5-25 MG capsule TAKE 1 CAPSULE BY MOUTH DAILY 90 capsule 1   No  current facility-administered medications for this visit.    No Known Allergies  Subjective:  Patient presents for session on time.  Patient shared progress, how he is felt more depressed over the last few days.  Patient went on to share how he has been dating but 1 lady that he was speaking to consistently recall for their dating as she stated that she plans to move away soon and wants to not get involved in a relationship at this point.  Patient shared how this combined with his chronic feelings of loneliness and worry that he will "be alone" for the remainder of his life has been the ongoing narrative that has maintained his feelings of depression and anxiety.  He stated that he has continued to struggle with making efforts toward managing his diet effectively towards losing weight which he feels is needed particularly if he is trying to date again.  Engaged in some problem solving with patient to further identify what is been effective for her in the past such as portion control and food choices.  In an effort to decrease negative thoughts, he plans to limit his getting on the scale and checking his weight frequently as he has been doing recently.      Interventions: CBT, supportive therapy, problem solving  Diagnoses:    ICD-10-CM   1. Depression, unspecified depression type  F32.A     Plan: Patient is to use CBT, mindfulness and  coping skills to help manage decrease symptoms associated with their diagnosis.    Patient to continue to make time to engage socially with friends.  Patient to take time to identify aspects of self that would allow him to engage in ultimately unsatisfying romantic relationships.    Long-term goal:   Reduce overall level, frequency, and intensity of the feelings of depression and anxiety 7/10 to a 0-2/10 in severity for at least 3 consecutive months.   Short-term goal:  Decrease "deflating, self-doubting" thinking style when thinking about his failed  relationships Increase self insight into what he needs to change about himself to allow for more balanced, healthy romantic relationships Identify and follow through on steps toward increased independence such as finding his own residence and increased financial stability  Utilize coping skills as discussed in session   Assessment of progress:  progressing   [name] Session, [facility]

## 2021-03-03 NOTE — Progress Notes (Signed)
[facility]  Name: Jason Ingram Date: 02/06/21 MRN: [id number] DOB: [birth year] PCP: Patient, No Pcp Per  Time spent: 54 minutes Treatment: individual therapy    Mental Status Exam:   Appearance:   Casual     Behavior:  Appropriate  Motor:  Normal  Speech/Language:   Clear and Coherent  Affect:  Constricted  Mood:  anxious and depressed  Thought process:  normal  Thought content:    WNL  Sensory/Perceptual disturbances:    none  Orientation:  x4  Attention:  Good  Concentration:  Good  Memory:  WNL  Fund of knowledge:   Good  Insight:    Good  Judgment:   Good  Impulse Control:  Good   Reported Symptoms:  Depressed mood, anxiety, negative self talk  Risk Assessment: Danger to Self:  No Self-injurious Behavior: No Danger to Others: No Duty to Warn:no Physical Aggression / Violence:No  Access to Firearms a concern: No  Gang Involvement:No  Patient / guardian was educated about steps to take if suicide or homicide risk level increases between visits: yes While future psychiatric events cannot be accurately predicted, the patient does not currently require acute inpatient psychiatric care and does not currently meet [facility] involuntary commitment criteria.  Medications: Current Outpatient Medications  Medication Sig Dispense Refill  . baclofen (LIORESAL) 10 MG tablet Take 10 mg 3 (three) times daily as needed by mouth for muscle spasms.    . cloNIDine (CATAPRES) 0.3 MG tablet TAKE 1 TABLET(0.3 MG) BY MOUTH DAILY 90 tablet 1  . escitalopram (LEXAPRO) 10 MG tablet Take 1 tablet (10 mg total) by mouth daily. 90 tablet 0  . finasteride (PROSCAR) 5 MG tablet Take 1 tablet (5 mg total) by mouth daily. 30 tablet 1  . gabapentin (NEURONTIN) 300 MG capsule Take 1 capsule (300 mg total) by mouth 3 (three) times daily. Gabapentin 300 mg Protocol Take a 300 mg capsule three times a day for one week, Then a 300 mg capsule twice a day for one week, Then a  300 mg capsule once a day for one week, then discontinue the Gabapentin. (Patient not taking: Reported on 01/17/2019) 42 capsule 0  . methocarbamol (ROBAXIN) 500 MG tablet Take 1 tablet (500 mg total) by mouth every 6 (six) hours as needed for muscle spasms. 80 tablet 0  . oxyCODONE (OXY IR/ROXICODONE) 5 MG immediate release tablet Take 1 tablet (5 mg total) by mouth every 6 (six) hours as needed for moderate pain or severe pain. (Patient not taking: Reported on 01/17/2019) 80 tablet 0  . rivaroxaban (XARELTO) 10 MG TABS tablet Take 1 tablet (10 mg total) by mouth daily with breakfast. Take Xarelto for two and a half more weeks following discharge from the hospital, then discontinue Xarelto. Once the patient has completed the blood thinner regimen, then take a Baby 81 mg Aspirin daily for three more weeks. (Patient not taking: Reported on 01/17/2019) 19 tablet 0  . simvastatin (ZOCOR) 40 MG tablet TAKE 1 TABLET BY MOUTH AT BEDTIME FOR CHOLESTEROL 30 tablet 0  . tamsulosin (FLOMAX) 0.4 MG CAPS capsule Take 0.4 mg daily after breakfast by mouth.    . traMADol (ULTRAM) 50 MG tablet Take 1 tablet (50 mg total) by mouth every 6 (six) hours as needed for moderate pain. (Patient not taking: Reported on 01/17/2019) 20 tablet 0  . triamterene-hydrochlorothiazide (DYAZIDE) 37.5-25 MG capsule TAKE 1 CAPSULE BY MOUTH DAILY 90 capsule 1   No current facility-administered medications for  this visit.     Subjective:  Patient presents for session on time.  Patient shared sad feelings related to his being noncontrolled her life with having a life partner.  He stated that he broke up with related that he had gone on a few dates recently and the other that he was dating also has told him that she does not want to get further involved as she plans to move in the coming months.  He expresses worry about getting into the new house he is building for himself, stating "what if I just move there and I am alone for the rest of my  life".  Patient was reminded of his friendships that he has, that he is unable to go on dates recently and how moving into this house will also give him the independence that he is also identified needing in past sessions as he continues to live with his sister and her husband at their home.  He stated he is also struggled with his dietary efforts recently but verbalizes in session his plans to schedule an exercise routine as well as a dietary regimen to increase his consistency in this area.   Interventions: CBT, supportive therapy, problem solving  Diagnoses:    ICD-10-CM   1. Depression, unspecified depression type  F32.A     Plan: Patient is to use CBT, mindfulness and coping skills to help manage decrease symptoms associated with their diagnosis.    Patient to continue to make time to engage socially with friends.  Patient to take time to identify aspects of self that would allow him to engage in ultimately unsatisfying romantic relationships.    Long-term goal:   Reduce overall level, frequency, and intensity of the feelings of depression and anxiety 7/10 to a 0-2/10 in severity for at least 3 consecutive months.   Short-term goal:  Decrease "deflating, self-doubting" thinking style when thinking about his failed relationships Increase self insight into what he needs to change about himself to allow for more balanced, healthy romantic relationships Identify and follow through on steps toward increased independence such as finding his own residence and increased financial stability  Utilize coping skills as discussed in session   Assessment of progress:  progressing   [name] Session, [facility]

## 2021-03-18 ENCOUNTER — Other Ambulatory Visit: Payer: Self-pay

## 2021-03-18 ENCOUNTER — Ambulatory Visit (INDEPENDENT_AMBULATORY_CARE_PROVIDER_SITE_OTHER): Payer: BC Managed Care – PPO | Admitting: Mental Health

## 2021-03-18 DIAGNOSIS — F32A Depression, unspecified: Secondary | ICD-10-CM | POA: Diagnosis not present

## 2021-03-18 NOTE — Progress Notes (Signed)
[facility]  Name: Jason Ingram Date:  03/18/21 MRN: [id number] DOB: May 19, 1956 PCP: Jason Shark, MD  Time spent: 55 minutes Treatment: individual therapy    Mental Status Exam:   Appearance:   Casual     Behavior:  Appropriate  Motor:  Normal  Speech/Language:   Clear and Coherent  Affect:  Constricted  Mood:  anxious and depressed  Thought process:  normal  Thought content:    WNL  Sensory/Perceptual disturbances:    none  Orientation:  x4  Attention:  Good  Concentration:  Good  Memory:  WNL  Fund of knowledge:   Good  Insight:    Good  Judgment:   Good  Impulse Control:  Good   Reported Symptoms:  Depressed mood, anxiety, negative self talk  Risk Assessment: Danger to Self:  No Self-injurious Behavior: No Danger to Others: No Duty to Warn:no Physical Aggression / Violence:No  Access to Firearms a concern: No  Gang Involvement:No  Patient / guardian was educated about steps to take if suicide or homicide risk level increases between visits: yes While future psychiatric events cannot be accurately predicted, the patient does not currently require acute inpatient psychiatric care and does not currently meet [facility] involuntary commitment criteria.  Medications: Current Outpatient Medications  Medication Sig Dispense Refill  . cloNIDine (CATAPRES) 0.3 MG tablet TAKE 1 TABLET(0.3 MG) BY MOUTH DAILY 90 tablet 1  . escitalopram (LEXAPRO) 10 MG tablet Take 1 tablet (10 mg total) by mouth daily. 90 tablet 0  . finasteride (PROSCAR) 5 MG tablet Take 1 tablet (5 mg total) by mouth daily. 30 tablet 1  . lisinopril (ZESTRIL) 10 MG tablet Take 10 mg by mouth daily.    Marland Kitchen oxybutynin (DITROPAN-XL) 10 MG 24 hr tablet Take 10 mg by mouth at bedtime.    . tamsulosin (FLOMAX) 0.4 MG CAPS capsule Take 0.4 mg daily after breakfast by mouth.    . triamterene-hydrochlorothiazide (DYAZIDE) 37.5-25 MG capsule TAKE 1 CAPSULE BY MOUTH DAILY 90 capsule 1   No  current facility-administered medications for this visit.    Subjective:  Patient presents for session on time.  Patient shared how he has felt less depressed since his last session.  He stated he is more hopeful about his situation, has been talking to some ladies that he met through a dating and also looks forward to moving into his house about 15 days.  He feels that he has had more distance from the lady he was dating about a month ago, which he stated has helped his mood.  Facilitated his identifying self supportive thoughts where he was able to identify "I know I have my friends, and I am also talking to people which makes me feel hopeful about my situation".  Patient also shared how he has made some initial progress on managing his diet and has lost a few pounds which he cited as a other calculation for his improving mood.    Interventions: CBT, supportive therapy, problem solving  Diagnoses:    ICD-10-CM   1. Depression, unspecified depression type  F32.A     Plan: Patient is to use CBT, mindfulness and coping skills to help manage decrease symptoms associated with their diagnosis.    Patient to continue to make time to engage socially with friends.     Long-term goal:   Reduce overall level, frequency, and intensity of the feelings of depression and anxiety 7/10 to a 0-2/10 in severity for at least  3 consecutive months.   Short-term goal:  Decrease "deflating, self-doubting" thinking style when thinking about his failed relationships Increase self insight into what he needs to change about himself to allow for more balanced, healthy romantic relationships Identify and follow through on steps toward increased independence such as finding his own residence and increased financial stability  Utilize coping skills as discussed in session   Assessment of progress:  progressing   Anson [place], [facility]

## 2021-04-11 ENCOUNTER — Ambulatory Visit: Payer: BC Managed Care – PPO

## 2021-04-11 ENCOUNTER — Other Ambulatory Visit: Payer: Self-pay

## 2021-04-11 DIAGNOSIS — G4733 Obstructive sleep apnea (adult) (pediatric): Secondary | ICD-10-CM

## 2021-04-17 ENCOUNTER — Telehealth: Payer: Self-pay | Admitting: Pulmonary Disease

## 2021-04-17 DIAGNOSIS — G4733 Obstructive sleep apnea (adult) (pediatric): Secondary | ICD-10-CM

## 2021-04-17 NOTE — Telephone Encounter (Signed)
Call patient  Sleep study result  Date of study: 04/11/2021  Impression: Severe obstructive sleep apnea Mild oxygen desaturations  Recommendation: DME referral  Recommend CPAP therapy for severe obstructive sleep apnea  Auto titrating CPAP with pressure settings of 5-20 will be appropriate  Encourage weight loss measures  Follow-up in the office 4 to 6 weeks following initiation of treatment

## 2021-04-18 NOTE — Addendum Note (Signed)
Addended bySandra Cockayne on: 04/18/2021 02:58 PM   Modules accepted: Orders

## 2021-04-18 NOTE — Telephone Encounter (Signed)
I called and spoke with patient regarding HST results and Dr. Agustina Caroli. Patient verbalized understanding. I sent in order for new cpap machine as patient said his old machine is not working. I informed patient DME company will reach out for next step. Patient verbalized understanding, nothing further needed.

## 2021-04-21 ENCOUNTER — Ambulatory Visit (INDEPENDENT_AMBULATORY_CARE_PROVIDER_SITE_OTHER): Payer: BC Managed Care – PPO | Admitting: Mental Health

## 2021-04-21 ENCOUNTER — Other Ambulatory Visit: Payer: Self-pay

## 2021-04-21 DIAGNOSIS — F32A Depression, unspecified: Secondary | ICD-10-CM | POA: Diagnosis not present

## 2021-04-21 NOTE — Progress Notes (Signed)
[facility]  Name: Jason Ingram Date:  04/21/21 MRN: [id number] DOB: [birth year] PCP: Toula Moos, MD  Time spent: 36  Minutes  Treatment: individual therapy   Mental Status Exam:   Appearance:   Casual     Behavior:  Appropriate  Motor:  Normal  Speech/Language:   Clear and Coherent  Affect:  Constricted  Mood:  anxious and depressed  Thought process:  normal  Thought content:    WNL  Sensory/Perceptual disturbances:    none  Orientation:  x4  Attention:  Good  Concentration:  Good  Memory:  WNL  Fund of knowledge:   Good  Insight:    Good  Judgment:   Good  Impulse Control:  Good   Reported Symptoms:  Depressed mood, anxiety, negative self talk  Risk Assessment: Danger to Self:  No Self-injurious Behavior: No Danger to Others: No Duty to Warn:no Physical Aggression / Violence:No  Access to Firearms a concern: No  Gang Involvement:No  Patient / guardian was educated about steps to take if suicide or homicide risk level increases between visits: yes While future psychiatric events cannot be accurately predicted, the patient does not currently require acute inpatient psychiatric care and does not currently meet [facility] involuntary commitment criteria.  Medications: Current Outpatient Medications  Medication Sig Dispense Refill  . cloNIDine (CATAPRES) 0.3 MG tablet TAKE 1 TABLET(0.3 MG) BY MOUTH DAILY 90 tablet 1  . escitalopram (LEXAPRO) 10 MG tablet Take 1 tablet (10 mg total) by mouth daily. 90 tablet 0  . finasteride (PROSCAR) 5 MG tablet Take 1 tablet (5 mg total) by mouth daily. 30 tablet 1  . lisinopril (ZESTRIL) 10 MG tablet Take 10 mg by mouth daily.    Marland Kitchen oxybutynin (DITROPAN-XL) 10 MG 24 hr tablet Take 10 mg by mouth at bedtime.    . tamsulosin (FLOMAX) 0.4 MG CAPS capsule Take 0.4 mg daily after breakfast by mouth.    . triamterene-hydrochlorothiazide (DYAZIDE) 37.5-25 MG capsule TAKE 1 CAPSULE BY MOUTH DAILY 90 capsule 1    No current facility-administered medications for this visit.    Subjective:  Patient presents for session on time.  Assessed progress where he stated that he has enjoyed moving into his new house.  Patient had waited several months for his house to be built and this is a step toward increasing some independence as he was living with his sister and her husband.  He continues to date, sharing some recent experiences.  He processed some thoughts and feelings related to the challenges of going on several diets over the past few months.  Through guided discovery, he identifies wanting to find a life partner as he has fears of being alone as he feels he is getting older.  This causing some anxiety for patient, facilitated his identifying self supportive thoughts where he shared "I have gone on a recent date that went well, I will just see where it goes that is all I can do". He continues to struggle with his diet, shares some plans on taking more steps to adhere to strategies to decrease calorie intake.  Interventions: CBT, supportive therapy, problem solving  Diagnoses:    ICD-10-CM   1. Depression, unspecified depression type  F32.A     Plan: Patient is to use CBT, mindfulness and coping skills to help manage decrease symptoms associated with their diagnosis.    Patient to continue to make time to engage socially with friends.  Patient to be mindful of self  talk between sessions that increases his feelings of anxiety and depression.   Long-term goal:   Reduce overall level, frequency, and intensity of the feelings of depression and anxiety 7/10 to a 0-2/10 in severity for at least 3 consecutive months.   Short-term goal:  Decrease "deflating, self-doubting" thinking style when thinking about his failed relationships Increase self insight into what he needs to change about himself to allow for more balanced, healthy romantic relationships Identify and follow through on steps toward increased  independence such as finding his own residence and increased financial stability  Utilize coping skills as discussed in session   Assessment of progress:  progressing   [name] Session, [facility]

## 2021-06-18 ENCOUNTER — Telehealth: Payer: Self-pay | Admitting: Adult Health

## 2021-06-18 NOTE — Telephone Encounter (Signed)
Pt called requesting refill for Lexapro @ new pharmacy [facility] [address] [place]. Moved and address updated. [address] w/ [address] & [address]

## 2021-06-19 ENCOUNTER — Other Ambulatory Visit: Payer: Self-pay

## 2021-06-19 DIAGNOSIS — F32A Depression, unspecified: Secondary | ICD-10-CM

## 2021-06-19 DIAGNOSIS — F411 Generalized anxiety disorder: Secondary | ICD-10-CM

## 2021-06-19 MED ORDER — ESCITALOPRAM OXALATE 10 MG PO TABS: 10.0000 mg | ORAL_TABLET | Freq: Every day | ORAL | 0 refills | Status: DC

## 2021-06-19 NOTE — Telephone Encounter (Signed)
Rx refill sent to updated pharmacy

## 2021-06-25 ENCOUNTER — Ambulatory Visit: Payer: BC Managed Care – PPO | Admitting: Adult Health

## 2021-06-25 ENCOUNTER — Ambulatory Visit: Payer: BC Managed Care – PPO | Admitting: Mental Health

## 2021-06-30 ENCOUNTER — Encounter: Payer: Self-pay | Admitting: Adult Health

## 2021-06-30 ENCOUNTER — Other Ambulatory Visit: Payer: Self-pay

## 2021-06-30 ENCOUNTER — Ambulatory Visit (INDEPENDENT_AMBULATORY_CARE_PROVIDER_SITE_OTHER): Payer: BC Managed Care – PPO | Admitting: Adult Health

## 2021-06-30 ENCOUNTER — Ambulatory Visit: Payer: BC Managed Care – PPO | Admitting: Adult Health

## 2021-06-30 DIAGNOSIS — F32A Depression, unspecified: Secondary | ICD-10-CM

## 2021-06-30 DIAGNOSIS — F411 Generalized anxiety disorder: Secondary | ICD-10-CM | POA: Diagnosis not present

## 2021-06-30 MED ORDER — ESCITALOPRAM OXALATE 10 MG PO TABS: 10.0000 mg | ORAL_TABLET | Freq: Every day | ORAL | 1 refills | Status: DC

## 2021-06-30 NOTE — Progress Notes (Signed)
CATO LIBURD [id number] 11/29/1956 65 y.o.  Subjective:   Patient ID:  Jason Ingram is a 65 y.o. (DOB 01-06-1956) male.  Chief Complaint: No chief complaint on file.   HPI Jason Ingram presents to the office today for follow-up of follow-up of depression and anxiety  Describes mood today as "ok". Pleasant. Mood symptoms - reports decreased depression, anxiety, and irritability. Stating "I feel more stable". Feels like medication has been helpful. Has moved from sister's basement into his own home. Stating "I'm happier being in my own place". Seeing therapist - Elio Forget. Varying interest and motivation.  Energy levels stable. Active, does not have a regular exercise routine. Walks 2 miles every day.  Enjoys some usual interests and activities. Single - dating. Lives alone. Has 2 grown children.  Appetite adequate. Weight gain 310 from 258.2.   Sleeps well most nights. Averages 8 hours. Focus and concentration stable - procrastinates at times.  Completing tasks. Managing aspects of household. Work going well - travels a lot.  Denies SI or HI.  Denies AH or VH.  Previous medications: Paxil, Zoloft   PHQ2-9    Flowsheet Row Office Visit from 09/29/2017 in [facility] Visit from 01/31/2016 in [facility] Visit from 10/09/2015 in [facility] Care at [facility] Total Score 0 0 0        Review of Systems:  Review of Systems  Musculoskeletal:  Negative for gait problem.  Neurological:  Negative for tremors.  Psychiatric/Behavioral:         Please refer to HPI   Medications: I have reviewed the patient's current medications.  Current Outpatient Medications  Medication Sig Dispense Refill   cloNIDine (CATAPRES) 0.3 MG tablet TAKE 1 TABLET(0.3 MG) BY MOUTH DAILY 90 tablet 1   escitalopram (LEXAPRO) 10 MG tablet Take 1 tablet (10 mg total) by mouth daily. 90 tablet 1   finasteride (PROSCAR) 5 MG tablet Take 1 tablet (5 mg total) by mouth daily.  30 tablet 1   lisinopril (ZESTRIL) 10 MG tablet Take 10 mg by mouth daily.     oxybutynin (DITROPAN-XL) 10 MG 24 hr tablet Take 10 mg by mouth at bedtime.     tamsulosin (FLOMAX) 0.4 MG CAPS capsule Take 0.4 mg daily after breakfast by mouth.     triamterene-hydrochlorothiazide (DYAZIDE) 37.5-25 MG capsule TAKE 1 CAPSULE BY MOUTH DAILY 90 capsule 1   No current facility-administered medications for this visit.    Medication Side Effects: None  Allergies: No Known Allergies  Past Medical History:  Diagnosis Date   Depression    High cholesterol    History of broken nose    History of pneumonia 1998   Hx of colonoscopy 2013   Hypertension    Injury of pelvis 1997   Sleep apnea     Past Medical History, Surgical history, Social history, and Family history were reviewed and updated as appropriate.   Please see review of systems for further details on the patient's review from today.   Objective:   Physical Exam:  There were no vitals taken for this visit.  Physical Exam Constitutional:      General: He is not in acute distress. Musculoskeletal:        General: No deformity.  Neurological:     Mental Status: He is alert and oriented to person, place, and time.     Coordination: Coordination normal.  Psychiatric:        Attention and Perception:  Attention and perception normal. He does not perceive auditory or visual hallucinations.        Mood and Affect: Mood normal. Mood is not anxious or depressed. Affect is not labile, blunt, angry or inappropriate.        Speech: Speech normal.        Behavior: Behavior normal.        Thought Content: Thought content normal. Thought content is not paranoid or delusional. Thought content does not include homicidal or suicidal ideation. Thought content does not include homicidal or suicidal plan.        Cognition and Memory: Cognition and memory normal.        Judgment: Judgment normal.     Comments: Insight intact    Lab Review:      Component Value Date/Time   NA 137 11/12/2017 0620   NA 141 05/08/2016 1630   K 3.3 (L) 11/12/2017 0620   CL 101 11/12/2017 0620   CO2 29 11/12/2017 0620   GLUCOSE 108 (H) 11/12/2017 0620   BUN 15 11/12/2017 0620   BUN 17 05/08/2016 1630   CREATININE 0.84 11/12/2017 0620   CREATININE 1.07 09/29/2017 1110   CALCIUM 8.2 (L) 11/12/2017 0620   PROT 6.1 (L) 11/11/2017 2014   PROT 7.0 05/08/2016 1630   ALBUMIN 2.9 (L) 11/11/2017 2014   ALBUMIN 4.4 05/08/2016 1630   AST 15 11/11/2017 2014   ALT 14 (L) 11/11/2017 2014   ALKPHOS 62 11/11/2017 2014   BILITOT 0.9 11/11/2017 2014   BILITOT 0.4 05/08/2016 1630   GFRNONAA >60 11/12/2017 0620   GFRNONAA 74 09/29/2017 1110   GFRAA >60 11/12/2017 0620   GFRAA 86 09/29/2017 1110       Component Value Date/Time   WBC 7.6 11/12/2017 0620   RBC 3.70 (L) 11/12/2017 0620   HGB 10.7 (L) 11/12/2017 0620   HCT 32.6 (L) 11/12/2017 0620   PLT 213 11/12/2017 0620   MCV 88.1 11/12/2017 0620   MCH 28.9 11/12/2017 0620   MCHC 32.8 11/12/2017 0620   RDW 13.4 11/12/2017 0620   LYMPHSABS 1.5 11/11/2017 2014   MONOABS 0.9 11/11/2017 2014   EOSABS 0.0 11/11/2017 2014   BASOSABS 0.0 11/11/2017 2014    No results found for: POCLITH, LITHIUM   No results found for: PHENYTOIN, PHENOBARB, VALPROATE, CBMZ   .res Assessment: Plan:    Plan:  Continue Lexapro 10mg  daily   Starting therapy today with  RTC 6 months  Patient advised to contact office with any questions, adverse effects, or acute worsening in signs and symptoms.  Diagnoses and all orders for this visit:  Depression, unspecified depression type -     escitalopram (LEXAPRO) 10 MG tablet; Take 1 tablet (10 mg total) by mouth daily.  Generalized anxiety disorder -     escitalopram (LEXAPRO) 10 MG tablet; Take 1 tablet (10 mg total) by mouth daily.    Please see After Visit Summary for patient specific instructions.  Future Appointments  Date Time Provider  Department Center  07/21/2021 10:00 AM 07/23/2021, [facility] None    No orders of the defined types were placed in this encounter.   -------------------------------

## 2021-07-21 ENCOUNTER — Other Ambulatory Visit: Payer: Self-pay

## 2021-07-21 ENCOUNTER — Ambulatory Visit: Payer: BC Managed Care – PPO | Admitting: Mental Health

## 2021-07-21 DIAGNOSIS — F32A Depression, unspecified: Secondary | ICD-10-CM

## 2021-07-21 NOTE — Progress Notes (Signed)
[facility]  Name: Jason Ingram Date:  07/21/21 MRN: [id number] DOB: 02-19-1956 PCP: Toula Moos, MD  Time spent: 75 Minutes  Treatment: individual therapy   Mental Status Exam:    Appearance:   Casual     Behavior:  Appropriate  Motor:  Normal  Speech/Language:   Clear and Coherent  Affect:  Constricted  Mood:  anxious and depressed  Thought process:  normal  Thought content:    WNL  Sensory/Perceptual disturbances:    none  Orientation:  x4  Attention:  Good  Concentration:  Good  Memory:  WNL  Fund of knowledge:   Good  Insight:    Good  Judgment:   Good  Impulse Control:  Good   Reported Symptoms:  Depressed mood, anxiety, negative self talk  Risk Assessment: Danger to Self:  No Self-injurious Behavior: No Danger to Others: No Duty to Warn:no Physical Aggression / Violence:No  Access to Firearms a concern: No  Gang Involvement:No  Patient / guardian was educated about steps to take if suicide or homicide risk level increases between visits: yes While future psychiatric events cannot be accurately predicted, the patient does not currently require acute inpatient psychiatric care and does not currently meet [facility] involuntary commitment criteria.  Medications: Current Outpatient Medications  Medication Sig Dispense Refill   cloNIDine (CATAPRES) 0.3 MG tablet TAKE 1 TABLET(0.3 MG) BY MOUTH DAILY 90 tablet 1   escitalopram (LEXAPRO) 10 MG tablet Take 1 tablet (10 mg total) by mouth daily. 90 tablet 1   finasteride (PROSCAR) 5 MG tablet Take 1 tablet (5 mg total) by mouth daily. 30 tablet 1   lisinopril (ZESTRIL) 10 MG tablet Take 10 mg by mouth daily.     oxybutynin (DITROPAN-XL) 10 MG 24 hr tablet Take 10 mg by mouth at bedtime.     tamsulosin (FLOMAX) 0.4 MG CAPS capsule Take 0.4 mg daily after breakfast by mouth.     triamterene-hydrochlorothiazide (DYAZIDE) 37.5-25 MG capsule TAKE 1 CAPSULE BY MOUTH DAILY 90 capsule 1   No  current facility-administered medications for this visit.    Subjective:  Patient presents for session on time.  He shared recent events, progress.  He stated that he has been dating a woman for the past 3 months, shared details how overall he is enjoying the relationship.  He identified the need to not "rush it", shared how in the past that he  had a tendency to hurry relationships somewhat; he is committed to giving this relationship time to develop.  He went on to share how work stress has been less from the standpoint of job security, that the company has stopped layoffs as this was occurring about 4 to 6 months ago.  He went on to share how he wants to care for himself by engaging in walking exercise, how he and his girlfriend do this together at times when they are together.  Facilitated his identifying and verbalizing needs where he plans to continue to engage in walking exercise and being mindful of his diet, particularly when on work trips.   Interventions: CBT, supportive therapy, problem solving  Diagnoses:    ICD-10-CM   1. Depression, unspecified depression type  F32.A       Plan:   Patient to continue to make time to engage socially with friends.  Patient to be mindful of self talk between sessions that increases his feelings of anxiety and depression.   Long-term goal:   Reduce overall level, frequency,  and intensity of the feelings of depression and anxiety 7/10 to a 0-2/10 in severity for at least 3 consecutive months.   Short-term goal:  Decrease "deflating, self-doubting" thinking style when thinking about his failed relationships Increase self insight into what he needs to change about himself to allow for more balanced, healthy romantic relationships Identify and follow through on steps toward increased independence such as finding his own residence and increased financial stability  Utilize coping skills as discussed in session   Assessment of progress:   progressing   [name] Session, [facility]

## 2021-08-20 ENCOUNTER — Other Ambulatory Visit: Payer: Self-pay

## 2021-08-20 ENCOUNTER — Ambulatory Visit: Payer: BC Managed Care – PPO | Admitting: Mental Health

## 2021-08-20 DIAGNOSIS — F32A Depression, unspecified: Secondary | ICD-10-CM

## 2021-08-20 NOTE — Progress Notes (Signed)
[facility]  Name: Jason Ingram Date:  08/20/21 MRN: [id number] DOB: Dec 02, 1956 PCP: Toula Moos, MD  Time spent: 35 Minutes  Treatment: individual therapy   Mental Status Exam:    Appearance:   Casual     Behavior:  Appropriate  Motor:  Normal  Speech/Language:   Clear and Coherent  Affect:  Constricted  Mood:  anxious and depressed  Thought process:  normal  Thought content:    WNL  Sensory/Perceptual disturbances:    none  Orientation:  x4  Attention:  Good  Concentration:  Good  Memory:  WNL  Fund of knowledge:   Good  Insight:    Good  Judgment:   Good  Impulse Control:  Good   Reported Symptoms:  Depressed mood, anxiety, negative self talk  Risk Assessment: Danger to Self:  No Self-injurious Behavior: No Danger to Others: No Duty to Warn:no Physical Aggression / Violence:No  Access to Firearms a concern: No  Gang Involvement:No  Patient / guardian was educated about steps to take if suicide or homicide risk level increases between visits: yes While future psychiatric events cannot be accurately predicted, the patient does not currently require acute inpatient psychiatric care and does not currently meet [facility] involuntary commitment criteria.  Medications: Current Outpatient Medications  Medication Sig Dispense Refill   cloNIDine (CATAPRES) 0.3 MG tablet TAKE 1 TABLET(0.3 MG) BY MOUTH DAILY 90 tablet 1   escitalopram (LEXAPRO) 10 MG tablet Take 1 tablet (10 mg total) by mouth daily. 90 tablet 1   finasteride (PROSCAR) 5 MG tablet Take 1 tablet (5 mg total) by mouth daily. 30 tablet 1   lisinopril (ZESTRIL) 10 MG tablet Take 10 mg by mouth daily.     oxybutynin (DITROPAN-XL) 10 MG 24 hr tablet Take 10 mg by mouth at bedtime.     tamsulosin (FLOMAX) 0.4 MG CAPS capsule Take 0.4 mg daily after breakfast by mouth.     triamterene-hydrochlorothiazide (DYAZIDE) 37.5-25 MG capsule TAKE 1 CAPSULE BY MOUTH DAILY 90 capsule 1   No  current facility-administered medications for this visit.    Subjective:  Patient presents for session on time.  Patient shared how his relationship with his girlfriend continues to be stable and ongoing.  He shared his tendency to think about marriage typically 3 months or so after dating.  Currently is the fourth month.  Facilitated patient identifying further thoughts about this tendency where he identified needing to "give it time".  He stated that being married twice as well as the woman he is dating also being married twice they both are "on the same page" with moving slowly and enjoying their relationship developing.  He continues to share how he wants to lose weight, struggles with his diet which we further explore collaboratively in session.  Facilitated his identifying steps to take towards making more progress in this area.    Interventions: CBT, supportive therapy, problem solving  Diagnoses:  No diagnosis found.   Plan:   Patient to continue to make time to engage socially with friends.  Patient to be mindful of self talk between sessions that increases his feelings of anxiety and depression.   Long-term goal:   Reduce overall level, frequency, and intensity of the feelings of depression and anxiety 7/10 to a 0-2/10 in severity for at least 3 consecutive months.   Short-term goal:  Decrease "deflating, self-doubting" thinking style when thinking about his failed relationships Increase self insight into what he needs to change  about himself to allow for more balanced, healthy romantic relationships Identify and follow through on steps toward increased independence such as finding his own residence and increased financial stability  Utilize coping skills as discussed in session   Assessment of progress:  progressing   [name] Session, [facility]

## 2021-09-30 ENCOUNTER — Ambulatory Visit: Payer: BC Managed Care – PPO | Admitting: Adult Health

## 2021-09-30 ENCOUNTER — Encounter: Payer: Self-pay | Admitting: Adult Health

## 2021-09-30 ENCOUNTER — Other Ambulatory Visit: Payer: Self-pay

## 2021-09-30 DIAGNOSIS — F32A Depression, unspecified: Secondary | ICD-10-CM

## 2021-09-30 DIAGNOSIS — F411 Generalized anxiety disorder: Secondary | ICD-10-CM

## 2021-09-30 MED ORDER — ESCITALOPRAM OXALATE 10 MG PO TABS: 10.0000 mg | ORAL_TABLET | Freq: Every day | ORAL | 1 refills | Status: DC

## 2021-09-30 NOTE — Progress Notes (Signed)
Jason Ingram [id number] Aug 08, 1956 65 y.o.  Subjective:   Patient ID:  Jason Ingram is a 65 y.o. (DOB 12/20/1955) male.  Chief Complaint: No chief complaint on file.   HPI Jason Ingram presents to the office today for follow-up of depression and anxiety.  Describes mood today as "ok". Pleasant. Mood symptoms - reports depression, anxiety, and irritability "sometimes". Stating "I'm doing alright". Feels like medication continues to work well. Has met someone and is dating. Enjoying his new home. Seeing therapist - Lanetta Inch. Varying interest and motivation. Energy levels stable. Active, does not have a regular exercise routine. Walks 2 miles every day.  Enjoys some usual interests and activities. Single - dating. Lives alone. Has 2 grown children.  Appetite adequate. Weight gain 310 to 320 pounds.   Sleeps well most nights. Averages 8 hours. Focus and concentration stable. Completing tasks. Managing aspects of household. Work going well - travels a lot.  Denies SI or HI.  Denies AH or VH.  Previous medications: Paxil, Zoloft   PHQ2-9    [place] Office Visit from 09/29/2017 in [facility] Visit from 01/31/2016 in [facility] Visit from 10/09/2015 in Primary Care at [facility] Total Score 0 0 0        Review of Systems:  Review of Systems  Musculoskeletal:  Negative for gait problem.  Neurological:  Negative for tremors.  Psychiatric/Behavioral:         Please refer to HPI   Medications: I have reviewed the patient's current medications.  Current Outpatient Medications  Medication Sig Dispense Refill   cloNIDine (CATAPRES) 0.3 MG tablet TAKE 1 TABLET(0.3 MG) BY MOUTH DAILY 90 tablet 1   escitalopram (LEXAPRO) 10 MG tablet Take 1 tablet (10 mg total) by mouth daily. 90 tablet 1   finasteride (PROSCAR) 5 MG tablet Take 1 tablet (5 mg total) by mouth daily. 30 tablet 1   lisinopril (ZESTRIL) 10 MG tablet Take 10 mg by mouth  daily.     oxybutynin (DITROPAN-XL) 10 MG 24 hr tablet Take 10 mg by mouth at bedtime.     tamsulosin (FLOMAX) 0.4 MG CAPS capsule Take 0.4 mg daily after breakfast by mouth.     triamterene-hydrochlorothiazide (DYAZIDE) 37.5-25 MG capsule TAKE 1 CAPSULE BY MOUTH DAILY 90 capsule 1   No current facility-administered medications for this visit.    Medication Side Effects: None  Allergies: No Known Allergies  Past Medical History:  Diagnosis Date   Depression    High cholesterol    History of broken nose    History of pneumonia 1998   Hx of colonoscopy 2013   Hypertension    Injury of pelvis 1997   Sleep apnea     Past Medical History, Surgical history, Social history, and Family history were reviewed and updated as appropriate.   Please see review of systems for further details on the patient's review from today.   Objective:   Physical Exam:  There were no vitals taken for this visit.  Physical Exam Constitutional:      General: He is not in acute distress. Musculoskeletal:        General: No deformity.  Neurological:     Mental Status: He is alert and oriented to person, place, and time.     Coordination: Coordination normal.  Psychiatric:        Attention and Perception: Attention and perception normal. He does not perceive auditory or visual hallucinations.  Mood and Affect: Mood normal. Mood is not anxious or depressed. Affect is not labile, blunt, angry or inappropriate.        Speech: Speech normal.        Behavior: Behavior normal.        Thought Content: Thought content normal. Thought content is not paranoid or delusional. Thought content does not include homicidal or suicidal ideation. Thought content does not include homicidal or suicidal plan.        Cognition and Memory: Cognition and memory normal.        Judgment: Judgment normal.     Comments: Insight intact    Lab Review:     Component Value Date/Time   NA 137 11/12/2017 0620   NA 141  05/08/2016 1630   K 3.3 (L) 11/12/2017 0620   CL 101 11/12/2017 0620   CO2 29 11/12/2017 0620   GLUCOSE 108 (H) 11/12/2017 0620   BUN 15 11/12/2017 0620   BUN 17 05/08/2016 1630   CREATININE 0.84 11/12/2017 0620   CREATININE 1.07 09/29/2017 1110   CALCIUM 8.2 (L) 11/12/2017 0620   PROT 6.1 (L) 11/11/2017 2014   PROT 7.0 05/08/2016 1630   ALBUMIN 2.9 (L) 11/11/2017 2014   ALBUMIN 4.4 05/08/2016 1630   AST 15 11/11/2017 2014   ALT 14 (L) 11/11/2017 2014   ALKPHOS 62 11/11/2017 2014   BILITOT 0.9 11/11/2017 2014   BILITOT 0.4 05/08/2016 1630   GFRNONAA >60 11/12/2017 0620   GFRNONAA 74 09/29/2017 1110   GFRAA >60 11/12/2017 0620   GFRAA 86 09/29/2017 1110       Component Value Date/Time   WBC 7.6 11/12/2017 0620   RBC 3.70 (L) 11/12/2017 0620   HGB 10.7 (L) 11/12/2017 0620   HCT 32.6 (L) 11/12/2017 0620   PLT 213 11/12/2017 0620   MCV 88.1 11/12/2017 0620   MCH 28.9 11/12/2017 0620   MCHC 32.8 11/12/2017 0620   RDW 13.4 11/12/2017 0620   LYMPHSABS 1.5 11/11/2017 2014   MONOABS 0.9 11/11/2017 2014   EOSABS 0.0 11/11/2017 2014   BASOSABS 0.0 11/11/2017 2014    No results found for: POCLITH, LITHIUM   No results found for: PHENYTOIN, PHENOBARB, VALPROATE, CBMZ   .res Assessment: Plan:    Plan:  Continue Lexapro 73m daily   Starting therapy today with CLanetta Inch RTC 6 months  Patient advised to contact office with any questions, adverse effects, or acute worsening in signs and symptoms. Diagnoses and all orders for this visit:  Depression, unspecified depression type -     escitalopram (LEXAPRO) 10 MG tablet; Take 1 tablet (10 mg total) by mouth daily.  Generalized anxiety disorder -     escitalopram (LEXAPRO) 10 MG tablet; Take 1 tablet (10 mg total) by mouth daily.    Please see After Visit Summary for patient specific instructions.  Future Appointments  Date Time Provider DCavour 10/22/2021 11:00 AM [facility] [place] [facility]  None  03/31/2022  8:20 AM Grenda Lora, RBerdie Ogren NP CP-CP None    No orders of the defined types were placed in this encounter.   -------------------------------

## 2021-10-22 ENCOUNTER — Other Ambulatory Visit: Payer: Self-pay

## 2021-10-22 ENCOUNTER — Ambulatory Visit (INDEPENDENT_AMBULATORY_CARE_PROVIDER_SITE_OTHER): Payer: BC Managed Care – PPO | Admitting: Mental Health

## 2021-10-22 DIAGNOSIS — F32A Depression, unspecified: Secondary | ICD-10-CM | POA: Diagnosis not present

## 2021-10-22 NOTE — Progress Notes (Signed)
[facility]  Name: Jason Ingram Date:  10/22/21 MRN: [id number] DOB: 1956/06/22 PCP: Toula Moos, MD  Time spent: 71 Minutes  Treatment: individual therapy   Mental Status Exam:    Appearance:   Casual     Behavior:  Appropriate  Motor:  Normal  Speech/Language:   Clear and Coherent  Affect:  Constricted  Mood:  anxious and depressed  Thought process:  normal  Thought content:    WNL  Sensory/Perceptual disturbances:    none  Orientation:  x4  Attention:  Good  Concentration:  Good  Memory:  WNL  Fund of knowledge:   Good  Insight:    Good  Judgment:   Good  Impulse Control:  Good   Reported Symptoms:  Depressed mood, anxiety, negative self talk  Risk Assessment: Danger to Self:  No Self-injurious Behavior: No Danger to Others: No Duty to Warn:no Physical Aggression / Violence:No  Access to Firearms a concern: No  Gang Involvement:No  Patient / guardian was educated about steps to take if suicide or homicide risk level increases between visits: yes While future psychiatric events cannot be accurately predicted, the patient does not currently require acute inpatient psychiatric care and does not currently meet [facility] involuntary commitment criteria.  Medications: Current Outpatient Medications  Medication Sig Dispense Refill   cloNIDine (CATAPRES) 0.3 MG tablet TAKE 1 TABLET(0.3 MG) BY MOUTH DAILY 90 tablet 1   escitalopram (LEXAPRO) 10 MG tablet Take 1 tablet (10 mg total) by mouth daily. 90 tablet 1   finasteride (PROSCAR) 5 MG tablet Take 1 tablet (5 mg total) by mouth daily. 30 tablet 1   lisinopril (ZESTRIL) 10 MG tablet Take 10 mg by mouth daily.     oxybutynin (DITROPAN-XL) 10 MG 24 hr tablet Take 10 mg by mouth at bedtime.     tamsulosin (FLOMAX) 0.4 MG CAPS capsule Take 0.4 mg daily after breakfast by mouth.     triamterene-hydrochlorothiazide (DYAZIDE) 37.5-25 MG capsule TAKE 1 CAPSULE BY MOUTH DAILY 90 capsule 1   No  current facility-administered medications for this visit.    Subjective:  Patient presents for session on time.  Patient shared recent progress and changes.  He stated he continues to date his girlfriend, this relationship is going well as he went on to share recent experiences they have with 1 another.  He stated that he is generally filled with his job, looks to retire possibly in about 4 years.  Through motivational interviewing, patient identified the need to have more social outlets, look to make more friends.  He stated he has his friend group that he made while attending a divorce support group years ago however, he stated most of them are much younger than him and live out of his current area.  He identified potentially going back to church as a way to align with his faith as well as meet others, also to engage in other activities such as golf which he enjoys.   Interventions: CBT, supportive therapy, problem solving  Diagnoses:    ICD-10-CM   1. Depression, unspecified depression type  F32.A        Plan:   Patient to continue to make time to engage socially with friends.  Patient to be mindful of self supportive cognitions, engage in outlets for stress management and enjoyment. Long-term goal:   Reduce overall level, frequency, and intensity of the feelings of depression and anxiety 7/10 to a 0-2/10 in severity for at least 3  consecutive months.   Short-term goal:  Decrease "deflating, self-doubting" thinking style when thinking about his failed relationships Increase self insight into what he needs to change about himself to allow for more balanced, healthy romantic relationships Identify and follow through on steps toward increased independence such as finding his own residence and increased financial stability  Utilize coping skills as discussed in session   Assessment of progress:  progressing   [name] Session, [facility]

## 2021-12-15 ENCOUNTER — Ambulatory Visit: Payer: BC Managed Care – PPO | Admitting: Mental Health

## 2022-01-09 ENCOUNTER — Ambulatory Visit: Payer: BC Managed Care – PPO | Admitting: Mental Health

## 2022-01-26 ENCOUNTER — Ambulatory Visit: Payer: BC Managed Care – PPO | Admitting: Mental Health

## 2022-02-17 ENCOUNTER — Ambulatory Visit: Payer: BC Managed Care – PPO | Admitting: Mental Health

## 2022-02-26 ENCOUNTER — Telehealth: Payer: Self-pay | Admitting: Pulmonary Disease

## 2022-02-26 DIAGNOSIS — G4733 Obstructive sleep apnea (adult) (pediatric): Secondary | ICD-10-CM

## 2022-02-27 ENCOUNTER — Other Ambulatory Visit: Payer: Self-pay

## 2022-02-27 ENCOUNTER — Ambulatory Visit (INDEPENDENT_AMBULATORY_CARE_PROVIDER_SITE_OTHER): Payer: BC Managed Care – PPO | Admitting: Mental Health

## 2022-02-27 DIAGNOSIS — F32A Depression, unspecified: Secondary | ICD-10-CM

## 2022-02-27 NOTE — Telephone Encounter (Signed)
Called and spoke with patient. Advised patient that I would send in an order for a new cpap machine. Order has been sent to [facility]. Nothing further needed.  ?

## 2022-02-27 NOTE — Progress Notes (Signed)
[facility] ? ?Name: Jason Ingram ?Date:  02/27/21 ?MRN: [id number] ?DOB: 1956-05-12 ?PCP: Owens Shark, MD ? ?Time spent: 9 Minutes ? ?Treatment: individual therapy  ? ?Mental Status Exam: ?  ? ?Appearance:   Casual     ?Behavior:  Appropriate  ?Motor:  Normal  ?Speech/Language:   Clear and Coherent  ?Affect:  Constricted  ?Mood:  anxious and depressed  ?Thought process:  normal  ?Thought content:    WNL  ?Sensory/Perceptual disturbances:    none  ?Orientation:  x4  ?Attention:  Good  ?Concentration:  Good  ?Memory:  WNL  ?Fund of knowledge:   Good  ?Insight:    Good  ?Judgment:   Good  ?Impulse Control:  Good  ? ?Reported Symptoms:  Depressed mood, anxiety, negative self talk ? ?Risk Assessment: ?Danger to Self:  No ?Self-injurious Behavior: No ?Danger to Others: No ?Duty to Warn:no ?Physical Aggression / Violence:No  ?Access to Firearms a concern: No  ?Gang Involvement:No  ?Patient / guardian was educated about steps to take if suicide or homicide risk level increases between visits: yes ?While future psychiatric events cannot be accurately predicted, the patient does not currently require acute inpatient psychiatric care and does not currently meet [facility] involuntary commitment criteria. ? ?Medications: ?Current Outpatient Medications  ?Medication Sig Dispense Refill  ? cloNIDine (CATAPRES) 0.3 MG tablet TAKE 1 TABLET(0.3 MG) BY MOUTH DAILY 90 tablet 1  ? escitalopram (LEXAPRO) 10 MG tablet Take 1 tablet (10 mg total) by mouth daily. 90 tablet 1  ? finasteride (PROSCAR) 5 MG tablet Take 1 tablet (5 mg total) by mouth daily. 30 tablet 1  ? lisinopril (ZESTRIL) 10 MG tablet Take 10 mg by mouth daily.    ? oxybutynin (DITROPAN-XL) 10 MG 24 hr tablet Take 10 mg by mouth at bedtime.    ? tamsulosin (FLOMAX) 0.4 MG CAPS capsule Take 0.4 mg daily after breakfast by mouth.    ? triamterene-hydrochlorothiazide (DYAZIDE) 37.5-25 MG capsule TAKE 1 CAPSULE BY MOUTH DAILY 90 capsule 1  ? ?No  current facility-administered medications for this visit.  ? ? ?Subjective:  ?Patient presents for session on time.  He shared work experiences, how there is been some increased stress recently and, going into the summer months he expects to be busier as this is true historically.  He continues to date his girlfriend, states the relationship is going well.  He shared how they have many positive experiences together, how she accompanied him on a business trip recently.  He stated they see each other typically once per week if he is in town if not more as they live about an hour away from 1 another.  Through guided discovery, he identified the need to have more social outlets, need for friendships as he considers himself outgoing and struggles to be alone generally.  He stated he plans to consider going back to church, potentially looking at some local groups to meet up with with similar interests in an attempt to make more connections in the area.  Facilitated his identifying needs and potential steps to take in meeting these needs throughout the session. ? ? ? ?Interventions: supportive therapy, problem solving ? ?Diagnoses:  ?  ICD-10-CM   ?1. Depression, unspecified depression type  F32.A   ?  ? ? ? ? ?Plan:   Patient to continue to make time to engage socially with friends.  Patient to be mindful of self supportive cognitions, engage in outlets for stress management and  enjoyment. ?Long-term goal:   ?Reduce overall level, frequency, and intensity of the feelings of depression and anxiety 7/10 to a 0-2/10 in severity for at least 3 consecutive months. ?  ?Short-term goal:  ?Decrease "deflating, self-doubting" thinking style when thinking about his failed relationships ?Increase self insight into what he needs to change about himself to allow for more balanced, healthy romantic relationships ?Identify and follow through on steps toward increased independence such as finding his own residence and increased financial  stability  ?Utilize coping skills as discussed in session ?  ?Assessment of progress:  progressing ? ? ?Anson Oregon, [facility]  ?

## 2022-03-31 ENCOUNTER — Ambulatory Visit: Payer: BC Managed Care – PPO | Admitting: Adult Health

## 2022-04-07 ENCOUNTER — Encounter: Payer: Self-pay | Admitting: Adult Health

## 2022-04-07 ENCOUNTER — Ambulatory Visit: Payer: BC Managed Care – PPO | Admitting: Adult Health

## 2022-04-07 DIAGNOSIS — F32A Depression, unspecified: Secondary | ICD-10-CM | POA: Diagnosis not present

## 2022-04-07 DIAGNOSIS — F411 Generalized anxiety disorder: Secondary | ICD-10-CM | POA: Diagnosis not present

## 2022-04-07 MED ORDER — ESCITALOPRAM OXALATE 10 MG PO TABS: 10.0000 mg | ORAL_TABLET | Freq: Every day | ORAL | 3 refills | Status: DC

## 2022-04-07 NOTE — Progress Notes (Signed)
Jason Ingram ?[id number] ?14-May-1956 ?66 y.o. ? ?Subjective:  ? ?Patient ID:  Jason Ingram is a 66 y.o. (DOB May 19, 1956) male. ? ?Chief Complaint: No chief complaint on file. ? ? ?HPI ?Jason Ingram presents to the office today for follow-up of depression and anxiety. ? ?Describes mood today as "ok". Pleasant. Mood symptoms - decreased depression, anxiety, and irritability "nothing out of the normal". Stating "I'm doing alright". Feels like medication continues to work well. Seeing therapist - Elio Forget. Varying interest and motivation. ?Energy levels stable. Active, does not have a regular exercise routine. Walks 2 miles every day.  ?Enjoys some usual interests and activities. Dating. Lives alone. Has 2 grown children.  ?Appetite adequate. Weight gain 310 to 320 pounds.   ?Sleeps well most nights. Averages 8 hours. ?Focus and concentration stable. Completing tasks. Managing aspects of household. Work going well - traveling.  ?Denies SI or HI.  ?Denies AH or VH. ? ?Previous medications: Paxil, Zoloft ? ? ?PHQ2-9   ? ?Flowsheet Row Office Visit from 09/29/2017 in [facility] and Adult Medicine Office Visit from 01/31/2016 in [facility] and Adult Medicine Office Visit from 10/09/2015 in Primary Care at [facility]  ?PHQ-2 Total Score 0 0 0  ? ?  ?  ? ?Review of Systems:  ?Review of Systems  ?Musculoskeletal:  Negative for gait problem.  ?Neurological:  Negative for tremors.  ?Psychiatric/Behavioral:    ?     Please refer to HPI  ? ?Medications: I have reviewed the patient's current medications. ? ?Current Outpatient Medications  ?Medication Sig Dispense Refill  ? cloNIDine (CATAPRES) 0.3 MG tablet TAKE 1 TABLET(0.3 MG) BY MOUTH DAILY 90 tablet 1  ? escitalopram (LEXAPRO) 10 MG tablet Take 1 tablet (10 mg total) by mouth daily. 90 tablet 1  ? finasteride (PROSCAR) 5 MG tablet Take 1 tablet (5 mg total) by mouth daily. 30 tablet 1  ? lisinopril (ZESTRIL) 10 MG tablet Take 10 mg by mouth daily.    ?  oxybutynin (DITROPAN-XL) 10 MG 24 hr tablet Take 10 mg by mouth at bedtime.    ? tamsulosin (FLOMAX) 0.4 MG CAPS capsule Take 0.4 mg daily after breakfast by mouth.    ? triamterene-hydrochlorothiazide (DYAZIDE) 37.5-25 MG capsule TAKE 1 CAPSULE BY MOUTH DAILY 90 capsule 1  ? ?No current facility-administered medications for this visit.  ? ? ?Medication Side Effects: None ? ?Allergies: No Known Allergies ? ?Past Medical History:  ?Diagnosis Date  ? Depression   ? High cholesterol   ? History of broken nose   ? History of pneumonia 1998  ? Hx of colonoscopy 2013  ? Hypertension   ? Injury of pelvis 1997  ? Sleep apnea   ? ? ?Past Medical History, Surgical history, Social history, and Family history were reviewed and updated as appropriate.  ? ?Please see review of systems for further details on the patient's review from today.  ? ?Objective:  ? ?Physical Exam:  ?There were no vitals taken for this visit. ? ?Physical Exam ?Constitutional:   ?   General: He is not in acute distress. ?Musculoskeletal:     ?   General: No deformity.  ?Neurological:  ?   Mental Status: He is alert and oriented to person, place, and time.  ?   Coordination: Coordination normal.  ?Psychiatric:     ?   Attention and Perception: Attention and perception normal. He does not perceive auditory or visual hallucinations.     ?  Mood and Affect: Mood normal. Mood is not anxious or depressed. Affect is not labile, blunt, angry or inappropriate.     ?   Speech: Speech normal.     ?   Behavior: Behavior normal.     ?   Thought Content: Thought content normal. Thought content is not paranoid or delusional. Thought content does not include homicidal or suicidal ideation. Thought content does not include homicidal or suicidal plan.     ?   Cognition and Memory: Cognition and memory normal.     ?   Judgment: Judgment normal.  ?   Comments: Insight intact  ? ? ?Lab Review:  ?   ?Component Value Date/Time  ? NA 137 11/12/2017 0620  ? NA 141 05/08/2016 1630   ? K 3.3 (L) 11/12/2017 6761  ? CL 101 11/12/2017 0620  ? CO2 29 11/12/2017 0620  ? GLUCOSE 108 (H) 11/12/2017 9509  ? BUN 15 11/12/2017 0620  ? BUN 17 05/08/2016 1630  ? CREATININE 0.84 11/12/2017 0620  ? CREATININE 1.07 09/29/2017 1110  ? CALCIUM 8.2 (L) 11/12/2017 3267  ? PROT 6.1 (L) 11/11/2017 2014  ? PROT 7.0 05/08/2016 1630  ? ALBUMIN 2.9 (L) 11/11/2017 2014  ? ALBUMIN 4.4 05/08/2016 1630  ? AST 15 11/11/2017 2014  ? ALT 14 (L) 11/11/2017 2014  ? ALKPHOS 62 11/11/2017 2014  ? BILITOT 0.9 11/11/2017 2014  ? BILITOT 0.4 05/08/2016 1630  ? GFRNONAA >60 11/12/2017 0620  ? GFRNONAA 74 09/29/2017 1110  ? GFRAA >60 11/12/2017 0620  ? GFRAA 86 09/29/2017 1110  ? ? ?   ?Component Value Date/Time  ? WBC 7.6 11/12/2017 0620  ? RBC 3.70 (L) 11/12/2017 1245  ? HGB 10.7 (L) 11/12/2017 8099  ? HCT 32.6 (L) 11/12/2017 8338  ? PLT 213 11/12/2017 0620  ? MCV 88.1 11/12/2017 0620  ? MCH 28.9 11/12/2017 0620  ? MCHC 32.8 11/12/2017 0620  ? RDW 13.4 11/12/2017 0620  ? LYMPHSABS 1.5 11/11/2017 2014  ? MONOABS 0.9 11/11/2017 2014  ? EOSABS 0.0 11/11/2017 2014  ? BASOSABS 0.0 11/11/2017 2014  ? ? ?No results found for: POCLITH, LITHIUM  ? ?No results found for: PHENYTOIN, PHENOBARB, VALPROATE, CBMZ  ? ?.res ?Assessment: Plan:   ? ?Plan: ? ?Continue Lexapro 10mg  daily  ? ?Starting therapy today with ? ?RTC 6 months ? ?Patient advised to contact office with any questions, adverse effects, or acute worsening in signs and symptoms. ? ?There are no diagnoses linked to this encounter.  ? ?Please see After Visit Summary for patient specific instructions. ? ?Future Appointments  ?Date Time Provider Department Center  ?05/26/2022 10:00 AM 05/28/2022, [facility] CP-CP None  ? ? ?No orders of the defined types were placed in this encounter. ? ? ?------------------------------- ?

## 2022-05-26 ENCOUNTER — Ambulatory Visit: Payer: BC Managed Care – PPO | Admitting: Mental Health

## 2022-06-15 ENCOUNTER — Ambulatory Visit: Payer: BC Managed Care – PPO | Admitting: Mental Health

## 2022-07-07 ENCOUNTER — Ambulatory Visit: Payer: BC Managed Care – PPO | Admitting: Mental Health

## 2022-10-08 ENCOUNTER — Ambulatory Visit: Payer: BC Managed Care – PPO | Admitting: Adult Health

## 2022-10-19 ENCOUNTER — Ambulatory Visit: Payer: BC Managed Care – PPO | Admitting: Adult Health

## 2022-10-21 ENCOUNTER — Encounter: Payer: Self-pay | Admitting: Adult Health

## 2022-10-21 ENCOUNTER — Ambulatory Visit: Payer: BC Managed Care – PPO | Admitting: Adult Health

## 2022-10-21 DIAGNOSIS — F32A Depression, unspecified: Secondary | ICD-10-CM | POA: Diagnosis not present

## 2022-10-21 DIAGNOSIS — F411 Generalized anxiety disorder: Secondary | ICD-10-CM

## 2022-10-21 NOTE — Progress Notes (Signed)
Jason Ingram [id number] Jan 08, 1956 66 y.o.  Subjective:   Patient ID:  Jason Ingram is a 66 y.o. (DOB 1956/07/14) male.  Chief Complaint: No chief complaint on file.   HPI Jason Ingram presents to the office today for follow-up of depression and anxiety.  Describes mood today as "ok". Pleasant. Mood symptoms - decreased depression, anxiety, and irritability Mood is consistent. Stating "I'm doing alright for now". Recently laid off of work after 23 years -  "still processing the change". Feels like medication continues to work well. Seeing therapist - Elio Forget. Varying interest and motivation. Energy levels stable. Active, does not have a regular exercise routine.  Enjoys some usual interests and activities. Dating. Lives alone. Has 2 grown children.  Appetite adequate. Weight gain 310 to 320 pounds.   Sleeps well most nights. Averages 8 hours. Focus and concentration stable. Completing tasks. Managing aspects of household. Laid off from job today. Denies SI or HI.  Denies AH or VH.  Previous medications: Paxil, Zoloft   PHQ2-9    Flowsheet Row Office Visit from 09/29/2017 in [facility] and Adult Medicine Office Visit from 01/31/2016 in [facility] and Adult Medicine Office Visit from 10/09/2015 in Primary Care at [facility] Total Score 0 0 0        Review of Systems:  Review of Systems  Musculoskeletal:  Negative for gait problem.  Neurological:  Negative for tremors.  Psychiatric/Behavioral:         Please refer to HPI    Medications: I have reviewed the patient's current medications.  Current Outpatient Medications  Medication Sig Dispense Refill   cloNIDine (CATAPRES) 0.3 MG tablet TAKE 1 TABLET(0.3 MG) BY MOUTH DAILY 90 tablet 1   escitalopram (LEXAPRO) 10 MG tablet Take 1 tablet (10 mg total) by mouth daily. 90 tablet 3   finasteride (PROSCAR) 5 MG tablet Take 1 tablet (5 mg total) by mouth daily. 30 tablet 1   lisinopril (ZESTRIL) 10  MG tablet Take 10 mg by mouth daily.     oxybutynin (DITROPAN-XL) 10 MG 24 hr tablet Take 10 mg by mouth at bedtime.     tamsulosin (FLOMAX) 0.4 MG CAPS capsule Take 0.4 mg daily after breakfast by mouth.     triamterene-hydrochlorothiazide (DYAZIDE) 37.5-25 MG capsule TAKE 1 CAPSULE BY MOUTH DAILY 90 capsule 1   No current facility-administered medications for this visit.    Medication Side Effects: None  Allergies: No Known Allergies  Past Medical History:  Diagnosis Date   Depression    High cholesterol    History of broken nose    History of pneumonia 1998   Hx of colonoscopy 2013   Hypertension    Injury of pelvis 1997   Sleep apnea     Past Medical History, Surgical history, Social history, and Family history were reviewed and updated as appropriate.   Please see review of systems for further details on the patient's review from today.   Objective:   Physical Exam:  There were no vitals taken for this visit.  Physical Exam Constitutional:      General: He is not in acute distress. Musculoskeletal:        General: No deformity.  Neurological:     Mental Status: He is alert and oriented to person, place, and time.     Coordination: Coordination normal.  Psychiatric:        Attention and Perception: Attention and perception normal. He does not perceive auditory  or visual hallucinations.        Mood and Affect: Mood normal. Mood is not anxious or depressed. Affect is not labile, blunt, angry or inappropriate.        Speech: Speech normal.        Behavior: Behavior normal.        Thought Content: Thought content normal. Thought content is not paranoid or delusional. Thought content does not include homicidal or suicidal ideation. Thought content does not include homicidal or suicidal plan.        Cognition and Memory: Cognition and memory normal.        Judgment: Judgment normal.     Comments: Insight intact     Lab Review:     Component Value Date/Time   NA  137 11/12/2017 0620   NA 141 05/08/2016 1630   K 3.3 (L) 11/12/2017 0620   CL 101 11/12/2017 0620   CO2 29 11/12/2017 0620   GLUCOSE 108 (H) 11/12/2017 0620   BUN 15 11/12/2017 0620   BUN 17 05/08/2016 1630   CREATININE 0.84 11/12/2017 0620   CREATININE 1.07 09/29/2017 1110   CALCIUM 8.2 (L) 11/12/2017 0620   PROT 6.1 (L) 11/11/2017 2014   PROT 7.0 05/08/2016 1630   ALBUMIN 2.9 (L) 11/11/2017 2014   ALBUMIN 4.4 05/08/2016 1630   AST 15 11/11/2017 2014   ALT 14 (L) 11/11/2017 2014   ALKPHOS 62 11/11/2017 2014   BILITOT 0.9 11/11/2017 2014   BILITOT 0.4 05/08/2016 1630   GFRNONAA >60 11/12/2017 0620   GFRNONAA 74 09/29/2017 1110   GFRAA >60 11/12/2017 0620   GFRAA 86 09/29/2017 1110       Component Value Date/Time   WBC 7.6 11/12/2017 0620   RBC 3.70 (L) 11/12/2017 0620   HGB 10.7 (L) 11/12/2017 0620   HCT 32.6 (L) 11/12/2017 0620   PLT 213 11/12/2017 0620   MCV 88.1 11/12/2017 0620   MCH 28.9 11/12/2017 0620   MCHC 32.8 11/12/2017 0620   RDW 13.4 11/12/2017 0620   LYMPHSABS 1.5 11/11/2017 2014   MONOABS 0.9 11/11/2017 2014   EOSABS 0.0 11/11/2017 2014   BASOSABS 0.0 11/11/2017 2014    No results found for: "POCLITH", "LITHIUM"   No results found for: "PHENYTOIN", "PHENOBARB", "VALPROATE", "CBMZ"   .res Assessment: Plan:    Plan:  Continue Lexapro 10mg  daily   Starting therapy today with  RTC 6 months  Patient advised to contact office with any questions, adverse effects, or acute worsening in signs and symptoms. Diagnoses and all orders for this visit:  Generalized anxiety disorder  Depression, unspecified depression type     Please see After Visit Summary for patient specific instructions.  Future Appointments  Date Time Provider Department Center  11/20/2022 10:00 AM 11/22/2022, [facility] CP-CP None  04/21/2023 11:40 AM Seville Brick, 04/23/2023, NP CP-CP None    No orders of the defined types were placed in this  encounter.   -------------------------------

## 2022-10-26 ENCOUNTER — Other Ambulatory Visit (HOSPITAL_COMMUNITY): Payer: Self-pay | Admitting: Orthopedic Surgery

## 2022-11-03 ENCOUNTER — Ambulatory Visit: Payer: BC Managed Care – PPO | Admitting: Mental Health

## 2022-11-03 DIAGNOSIS — F32A Depression, unspecified: Secondary | ICD-10-CM | POA: Diagnosis not present

## 2022-11-03 NOTE — Progress Notes (Signed)
[facility]  Name: Jason Ingram Date:  11/03/21 MRN: [id number] DOB: 26-Aug-1956 PCP: Toula Moos, MD  Time spent: 40 Minutes  Treatment: individual therapy   Mental Status Exam:    Appearance:   Casual     Behavior:  Appropriate  Motor:  Normal  Speech/Language:   Clear and Coherent  Affect:  Constricted  Mood:  anxious and depressed  Thought process:  normal  Thought content:    WNL  Sensory/Perceptual disturbances:    none  Orientation:  x4  Attention:  Good  Concentration:  Good  Memory:  WNL  Fund of knowledge:   Good  Insight:    Good  Judgment:   Good  Impulse Control:  Good   Reported Symptoms:  Depressed mood, anxiety, negative self talk  Risk Assessment: Danger to Self:  No Self-injurious Behavior: No Danger to Others: No Duty to Warn:no Physical Aggression / Violence:No  Access to Firearms a concern: No  Gang Involvement:No  Patient / guardian was educated about steps to take if suicide or homicide risk level increases between visits: yes While future psychiatric events cannot be accurately predicted, the patient does not currently require acute inpatient psychiatric care and does not currently meet [facility] involuntary commitment criteria.  Medications: Current Outpatient Medications  Medication Sig Dispense Refill   cloNIDine (CATAPRES) 0.3 MG tablet TAKE 1 TABLET(0.3 MG) BY MOUTH DAILY 90 tablet 1   escitalopram (LEXAPRO) 10 MG tablet Take 1 tablet (10 mg total) by mouth daily. 90 tablet 3   finasteride (PROSCAR) 5 MG tablet Take 1 tablet (5 mg total) by mouth daily. 30 tablet 1   lisinopril (ZESTRIL) 10 MG tablet Take 10 mg by mouth daily.     oxybutynin (DITROPAN-XL) 10 MG 24 hr tablet Take 10 mg by mouth at bedtime.     tamsulosin (FLOMAX) 0.4 MG CAPS capsule Take 0.4 mg daily after breakfast by mouth.     triamterene-hydrochlorothiazide (DYAZIDE) 37.5-25 MG capsule TAKE 1 CAPSULE BY MOUTH DAILY 90 capsule 1   No  current facility-administered medications for this visit.    Subjective:  Patient presents for session on time.  Assessed recent events and progress since last visit which was approximately 7 months ago.  Patient shared how he lost his job about 2 weeks ago going on to share details and how it has been upsetting emotionally, leaving him depressed with lower motivation, struggling to get out of bed in the morning.  He stated he has been diligent in looking for other employment options, applying for jobs, trying to exercise a few times a week.  He stated he also learned that he had to have surgery on his ankle in January which further complicates matters.  Explored coping, discussed diaphragmatic breathing, mindfulness to utilize between visits, gave examples of how to utilize and apply daily.  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  Patient shared his concern related to finances.  He plans to follow through with ways to cope and care for himself by continuing to exercise daily, adhere to a consistent bedtime regimen and try and eat more frequently as this has decreased as well.    Interventions: supportive therapy, problem solving, CBT  Diagnoses:    ICD-10-CM   1. Depression, unspecified depression type  F32.A          Plan:   Patient to continue to make time to engage socially with friends.  Patient to be mindful of self supportive cognitions, engage in outlets for stress management and enjoyment. Long-term goal:   Reduce overall level, frequency, and intensity of the feelings of depression and anxiety 7/10 to a 0-2/10 in severity for at least 3 consecutive months.   Short-term goal:  Decrease "deflating, self-doubting" thinking style when thinking about his failed relationships Increase  self insight into what he needs to change about himself to allow for more balanced, healthy romantic relationships Identify and follow through on steps toward increased independence such as finding his own residence and increased financial stability  Utilize coping skills as discussed in session   Assessment of progress:  progressing   [name] Session, [facility]

## 2022-11-12 ENCOUNTER — Ambulatory Visit: Payer: BC Managed Care – PPO | Admitting: Mental Health

## 2022-11-12 DIAGNOSIS — F32A Depression, unspecified: Secondary | ICD-10-CM | POA: Diagnosis not present

## 2022-11-20 ENCOUNTER — Ambulatory Visit: Payer: BC Managed Care – PPO | Admitting: Mental Health

## 2022-11-20 DIAGNOSIS — F32A Depression, unspecified: Secondary | ICD-10-CM

## 2022-11-20 NOTE — Progress Notes (Signed)
[facility] Note  Name: VIAN FLUEGEL Date:  11/20/21 MRN: [id number] DOB: 1956-12-04 PCP: Toula Moos, MD  Time spent: 76 Minutes  Treatment: individual therapy   Mental Status Exam:    Appearance:   Casual     Behavior:  Appropriate  Motor:  Normal  Speech/Language:   Clear and Coherent  Affect:  Constricted  Mood:  anxious and depressed  Thought process:  normal  Thought content:    WNL  Sensory/Perceptual disturbances:    none  Orientation:  x4  Attention:  Good  Concentration:  Good  Memory:  WNL  Fund of knowledge:   Good  Insight:    Good  Judgment:   Good  Impulse Control:  Good   Reported Symptoms:  Depressed mood, anxiety, negative self talk  Risk Assessment: Danger to Self:  No Self-injurious Behavior: No Danger to Others: No Duty to Warn:no Physical Aggression / Violence:No  Access to Firearms a concern: No  Gang Involvement:No  Patient / guardian was educated about steps to take if suicide or homicide risk level increases between visits: yes While future psychiatric events cannot be accurately predicted, the patient does not currently require acute inpatient psychiatric care and does not currently meet [facility] involuntary commitment criteria.  Medications: Current Outpatient Medications  Medication Sig Dispense Refill   cloNIDine (CATAPRES) 0.3 MG tablet TAKE 1 TABLET(0.3 MG) BY MOUTH DAILY 90 tablet 1   escitalopram (LEXAPRO) 10 MG tablet Take 1 tablet (10 mg total) by mouth daily. 90 tablet 3   finasteride (PROSCAR) 5 MG tablet Take 1 tablet (5 mg total) by mouth daily. 30 tablet 1   lisinopril (ZESTRIL) 10 MG tablet Take 10 mg by mouth daily.     oxybutynin (DITROPAN-XL) 10 MG 24 hr tablet Take 10 mg by mouth at bedtime.     tamsulosin (FLOMAX) 0.4 MG CAPS capsule Take 0.4 mg daily after breakfast by mouth.     triamterene-hydrochlorothiazide (DYAZIDE) 37.5-25 MG capsule TAKE 1 CAPSULE BY MOUTH DAILY 90 capsule 1   No  current facility-administered medications for this visit.    Subjective:  Patient presents for session on time.  Today is his last day at work, feeling down as a result.  Patient stated he is continuing to try and adjust.  He stated he continues to cope with low motivation, depressed mood most days, mornings being particularly difficult.  Expresses a desire to try and work out more consistently, wants to get up and get ready for the day but is struggling.  Ways to increase motivation such as creating a daily schedule with which to adhere.  Facilitated his identifying associated thoughts with his mood, worked with him to reframe and identify self supportive thoughts toward making identified changes.  He plans to work at least 2 more years before retiring, identifies needing purpose most days and without work this has been more of a struggle.  He is preparing himself for his surgery which is in early January, continues to have support from friends and his girlfriend.    Interventions: supportive therapy, problem solving, CBT  Diagnoses:    ICD-10-CM   1. Depression, unspecified depression type  F32.A           Plan:   Patient to continue to make time to engage socially with friends.  Patient to be mindful of self supportive cognitions, engage in outlets for stress management and enjoyment. Long-term goal:   Reduce overall level, frequency, and intensity of the  feelings of depression and anxiety 7/10 to a 0-2/10 in severity for at least 3 consecutive months.   Short-term goal:  Decrease "deflating, self-doubting" thinking style when thinking about his failed relationships Increase self insight into what he needs to change about himself to allow for more balanced, healthy romantic relationships Identify and follow through on steps toward increased independence such as finding his own residence and increased financial stability  Utilize coping skills as discussed in session   Assessment of  progress:  progressing   [name] Session, [facility]

## 2022-11-27 ENCOUNTER — Encounter (HOSPITAL_BASED_OUTPATIENT_CLINIC_OR_DEPARTMENT_OTHER): Payer: Self-pay | Admitting: Orthopedic Surgery

## 2022-12-02 ENCOUNTER — Encounter (HOSPITAL_BASED_OUTPATIENT_CLINIC_OR_DEPARTMENT_OTHER)
Admission: RE | Admit: 2022-12-02 | Discharge: 2022-12-02 | Disposition: A | Discharge status: Home / Self Care | Payer: BC Managed Care – PPO | Source: Ambulatory Visit | Attending: Orthopedic Surgery | Admitting: Orthopedic Surgery

## 2022-12-02 DIAGNOSIS — Z01810 Encounter for preprocedural cardiovascular examination: Secondary | ICD-10-CM | POA: Insufficient documentation

## 2022-12-02 NOTE — Progress Notes (Signed)

## 2022-12-03 NOTE — Progress Notes (Signed)
[facility] Note  Name: Jason Ingram Date:  11/12/21 MRN: [id number] DOB: 02/20/1956 PCP: Toula Moos, MD  Time spent: 87 Minutes  Treatment: individual therapy   Mental Status Exam:    Appearance:   Casual     Behavior:  Appropriate  Motor:  Normal  Speech/Language:   Clear and Coherent  Affect:  Constricted  Mood:  anxious and depressed  Thought process:  normal  Thought content:    WNL  Sensory/Perceptual disturbances:    none  Orientation:  x4  Attention:  Good  Concentration:  Good  Memory:  WNL  Fund of knowledge:   Good  Insight:    Good  Judgment:   Good  Impulse Control:  Good   Reported Symptoms:  Depressed mood, anxiety, negative self talk  Risk Assessment: Danger to Self:  No Self-injurious Behavior: No Danger to Others: No Duty to Warn:no Physical Aggression / Violence:No  Access to Firearms a concern: No  Gang Involvement:No  Patient / guardian was educated about steps to take if suicide or homicide risk level increases between visits: yes While future psychiatric events cannot be accurately predicted, the patient does not currently require acute inpatient psychiatric care and does not currently meet [facility] involuntary commitment criteria.  Medications: Current Outpatient Medications  Medication Sig Dispense Refill   amLODipine (NORVASC) 5 MG tablet Take 5 mg by mouth daily.     cloNIDine (CATAPRES) 0.3 MG tablet TAKE 1 TABLET(0.3 MG) BY MOUTH DAILY 90 tablet 1   escitalopram (LEXAPRO) 10 MG tablet Take 1 tablet (10 mg total) by mouth daily. 90 tablet 3   finasteride (PROSCAR) 5 MG tablet Take 1 tablet (5 mg total) by mouth daily. 30 tablet 1   lisinopril (ZESTRIL) 10 MG tablet Take 10 mg by mouth daily.     oxybutynin (DITROPAN-XL) 10 MG 24 hr tablet Take 10 mg by mouth at bedtime.     tamsulosin (FLOMAX) 0.4 MG CAPS capsule Take 0.4 mg daily after breakfast by mouth.     topiramate (TOPAMAX) 25 MG capsule Take 25 mg by  mouth 2 (two) times daily.     No current facility-administered medications for this visit.    Subjective:  Patient presents for session on time. Patient shared progress, continues to cope with recent life adjustments that have been stressful. He stated that he is continued his job search and has had some tentative offers. Facilitated his identifying ways to cope with a focus on identifying progress he is made and how he can sustain himself financially over the next few months including after his surgery. He identified challenges with self-confidence Tawanna Cooler to his anxiety. Cloudfully, explored ways to engage in interest to occupy his time more frequently as he identified it being a challenge when he has too much down time. He plans to spend time with some friends and with his girlfriend.    Interventions: supportive therapy, problem solving, CBT  Diagnoses:  No diagnosis found.      Plan:   Patient to continue to make time to engage socially with friends.  Patient to be mindful of self supportive cognitions, engage in outlets for stress management and enjoyment. Long-term goal:   Reduce overall level, frequency, and intensity of the feelings of depression and anxiety 7/10 to a 0-2/10 in severity for at least 3 consecutive months.   Short-term goal:  Decrease "deflating, self-doubting" thinking style when thinking about his failed relationships Increase self insight into what he needs to  change about himself to allow for more balanced, healthy romantic relationships Identify and follow through on steps toward increased independence such as finding his own residence and increased financial stability  Utilize coping skills as discussed in session   Assessment of progress:  progressing   [name] Session, [facility]

## 2022-12-10 ENCOUNTER — Ambulatory Visit (HOSPITAL_BASED_OUTPATIENT_CLINIC_OR_DEPARTMENT_OTHER): Payer: Medicare Other | Admitting: Certified Registered"

## 2022-12-10 ENCOUNTER — Ambulatory Visit (HOSPITAL_BASED_OUTPATIENT_CLINIC_OR_DEPARTMENT_OTHER)
Admission: RE | Admit: 2022-12-10 | Discharge: 2022-12-10 | Disposition: A | Discharge status: Home / Self Care | Payer: Medicare Other | Source: Ambulatory Visit | Attending: Orthopedic Surgery | Admitting: Orthopedic Surgery

## 2022-12-10 ENCOUNTER — Other Ambulatory Visit: Payer: Self-pay

## 2022-12-10 ENCOUNTER — Encounter (HOSPITAL_BASED_OUTPATIENT_CLINIC_OR_DEPARTMENT_OTHER): Payer: Self-pay | Admitting: Orthopedic Surgery

## 2022-12-10 ENCOUNTER — Encounter (HOSPITAL_BASED_OUTPATIENT_CLINIC_OR_DEPARTMENT_OTHER)
Admission: RE | Disposition: A | Discharge status: Home / Self Care | Payer: Self-pay | Source: Ambulatory Visit | Attending: Orthopedic Surgery

## 2022-12-10 DIAGNOSIS — Z9989 Dependence on other enabling machines and devices: Secondary | ICD-10-CM | POA: Diagnosis not present

## 2022-12-10 DIAGNOSIS — Z6841 Body Mass Index (BMI) 40.0 and over, adult: Secondary | ICD-10-CM | POA: Insufficient documentation

## 2022-12-10 DIAGNOSIS — I10 Essential (primary) hypertension: Secondary | ICD-10-CM | POA: Diagnosis not present

## 2022-12-10 DIAGNOSIS — S86012A Strain of left Achilles tendon, initial encounter: Secondary | ICD-10-CM | POA: Insufficient documentation

## 2022-12-10 DIAGNOSIS — G4733 Obstructive sleep apnea (adult) (pediatric): Secondary | ICD-10-CM

## 2022-12-10 DIAGNOSIS — F32A Depression, unspecified: Secondary | ICD-10-CM | POA: Insufficient documentation

## 2022-12-10 DIAGNOSIS — X58XXXA Exposure to other specified factors, initial encounter: Secondary | ICD-10-CM | POA: Insufficient documentation

## 2022-12-10 DIAGNOSIS — Z79899 Other long term (current) drug therapy: Secondary | ICD-10-CM | POA: Insufficient documentation

## 2022-12-10 DIAGNOSIS — M6702 Short Achilles tendon (acquired), left ankle: Secondary | ICD-10-CM | POA: Diagnosis not present

## 2022-12-10 DIAGNOSIS — Z01818 Encounter for other preprocedural examination: Secondary | ICD-10-CM

## 2022-12-10 DIAGNOSIS — G4730 Sleep apnea, unspecified: Secondary | ICD-10-CM | POA: Insufficient documentation

## 2022-12-10 HISTORY — PX: TENDON TRANSFER: SHX6109

## 2022-12-10 HISTORY — PX: ACHILLES TENDON SURGERY: SHX542

## 2022-12-10 HISTORY — PX: GASTROCNEMIUS RECESSION: SHX863

## 2022-12-10 SURGERY — REPAIR, TENDON, ACHILLES
Anesthesia: Regional | Site: Leg Lower | Laterality: Left

## 2022-12-10 MED ORDER — ONDANSETRON HCL 4 MG/2ML IJ SOLN
INTRAMUSCULAR | Status: DC | PRN
  Administered 2022-12-10: 4 mg via INTRAVENOUS

## 2022-12-10 MED ORDER — SODIUM CHLORIDE 0.9 % IV SOLN: INTRAVENOUS | Status: DC

## 2022-12-10 MED ORDER — ROCURONIUM BROMIDE 100 MG/10ML IV SOLN
INTRAVENOUS | Status: DC | PRN
  Administered 2022-12-10: 80 mg via INTRAVENOUS

## 2022-12-10 MED ORDER — ACETAMINOPHEN 500 MG PO TABS
ORAL_TABLET | ORAL | Status: AC
  Filled 2022-12-10: qty 2

## 2022-12-10 MED ORDER — OXYCODONE HCL 5 MG PO TABS: 5.0000 mg | ORAL_TABLET | Freq: Four times a day (QID) | ORAL | 0 refills | Status: AC | PRN

## 2022-12-10 MED ORDER — PHENYLEPHRINE 80 MCG/ML (10ML) SYRINGE FOR IV PUSH (FOR BLOOD PRESSURE SUPPORT)
PREFILLED_SYRINGE | INTRAVENOUS | Status: AC
  Filled 2022-12-10: qty 10

## 2022-12-10 MED ORDER — EPHEDRINE 5 MG/ML INJ
INTRAVENOUS | Status: AC
  Filled 2022-12-10: qty 5

## 2022-12-10 MED ORDER — CEFAZOLIN IN SODIUM CHLORIDE 3-0.9 GM/100ML-% IV SOLN
INTRAVENOUS | Status: AC
  Filled 2022-12-10: qty 100

## 2022-12-10 MED ORDER — EPHEDRINE SULFATE (PRESSORS) 50 MG/ML IJ SOLN
INTRAMUSCULAR | Status: DC | PRN
  Administered 2022-12-10: 10 mg via INTRAVENOUS

## 2022-12-10 MED ORDER — DEXAMETHASONE SODIUM PHOSPHATE 10 MG/ML IJ SOLN
INTRAMUSCULAR | Status: DC | PRN
  Administered 2022-12-10 (×2): 5 mg

## 2022-12-10 MED ORDER — VANCOMYCIN HCL 500 MG IV SOLR
INTRAVENOUS | Status: DC | PRN
  Administered 2022-12-10: 500 mg via TOPICAL

## 2022-12-10 MED ORDER — DEXAMETHASONE SODIUM PHOSPHATE 4 MG/ML IJ SOLN
INTRAMUSCULAR | Status: DC | PRN
  Administered 2022-12-10: 4 mg via INTRAVENOUS

## 2022-12-10 MED ORDER — FENTANYL CITRATE (PF) 100 MCG/2ML IJ SOLN
INTRAMUSCULAR | Status: AC
  Filled 2022-12-10: qty 2

## 2022-12-10 MED ORDER — FENTANYL CITRATE (PF) 100 MCG/2ML IJ SOLN
100.0000 ug | Freq: Once | INTRAMUSCULAR | Status: AC
  Administered 2022-12-10: 100 ug via INTRAVENOUS

## 2022-12-10 MED ORDER — RIVAROXABAN 10 MG PO TABS: 10.0000 mg | ORAL_TABLET | Freq: Every day | ORAL | 0 refills | Status: DC

## 2022-12-10 MED ORDER — ROPIVACAINE HCL 5 MG/ML IJ SOLN
INTRAMUSCULAR | Status: DC | PRN
  Administered 2022-12-10: 20 mL via PERINEURAL
  Administered 2022-12-10: 30 mL via PERINEURAL

## 2022-12-10 MED ORDER — SUCCINYLCHOLINE CHLORIDE 200 MG/10ML IV SOSY
PREFILLED_SYRINGE | INTRAVENOUS | Status: AC
  Filled 2022-12-10: qty 30

## 2022-12-10 MED ORDER — FENTANYL CITRATE (PF) 100 MCG/2ML IJ SOLN: 100.0000 ug | Freq: Once | INTRAMUSCULAR | Status: AC

## 2022-12-10 MED ORDER — CEFAZOLIN IN SODIUM CHLORIDE 3-0.9 GM/100ML-% IV SOLN
3.0000 g | INTRAVENOUS | Status: AC
  Administered 2022-12-10: 3 g via INTRAVENOUS

## 2022-12-10 MED ORDER — FENTANYL CITRATE (PF) 100 MCG/2ML IJ SOLN
25.0000 ug | INTRAMUSCULAR | Status: DC | PRN
  Administered 2022-12-10: 50 ug via INTRAVENOUS

## 2022-12-10 MED ORDER — VANCOMYCIN HCL 500 MG IV SOLR
INTRAVENOUS | Status: AC
  Filled 2022-12-10: qty 10

## 2022-12-10 MED ORDER — SUGAMMADEX SODIUM 500 MG/5ML IV SOLN
INTRAVENOUS | Status: DC | PRN
  Administered 2022-12-10: 500 mg via INTRAVENOUS

## 2022-12-10 MED ORDER — MIDAZOLAM HCL 2 MG/2ML IJ SOLN: 2.0000 mg | Freq: Once | INTRAMUSCULAR | Status: AC

## 2022-12-10 MED ORDER — PROPOFOL 10 MG/ML IV BOLUS
INTRAVENOUS | Status: DC | PRN
  Administered 2022-12-10: 200 mg via INTRAVENOUS

## 2022-12-10 MED ORDER — SUGAMMADEX SODIUM 500 MG/5ML IV SOLN
INTRAVENOUS | Status: AC
  Filled 2022-12-10: qty 5

## 2022-12-10 MED ORDER — FENTANYL CITRATE (PF) 100 MCG/2ML IJ SOLN
INTRAMUSCULAR | Status: DC | PRN
  Administered 2022-12-10 (×2): 50 ug via INTRAVENOUS

## 2022-12-10 MED ORDER — DEXMEDETOMIDINE HCL IN NACL 80 MCG/20ML IV SOLN
INTRAVENOUS | Status: DC | PRN
  Administered 2022-12-10: 8 ug via BUCCAL

## 2022-12-10 MED ORDER — MIDAZOLAM HCL 2 MG/2ML IJ SOLN
2.0000 mg | Freq: Once | INTRAMUSCULAR | Status: AC
  Administered 2022-12-10: 2 mg via INTRAVENOUS

## 2022-12-10 MED ORDER — MIDAZOLAM HCL 2 MG/2ML IJ SOLN
INTRAMUSCULAR | Status: AC
  Filled 2022-12-10: qty 2

## 2022-12-10 MED ORDER — LACTATED RINGERS IV SOLN: INTRAVENOUS | Status: DC

## 2022-12-10 MED ORDER — ACETAMINOPHEN 500 MG PO TABS
1000.0000 mg | ORAL_TABLET | Freq: Once | ORAL | Status: AC
  Administered 2022-12-10: 1000 mg via ORAL

## 2022-12-10 MED ORDER — 0.9 % SODIUM CHLORIDE (POUR BTL) OPTIME
TOPICAL | Status: DC | PRN
  Administered 2022-12-10: 200 mL

## 2022-12-10 SURGICAL SUPPLY — 88 items
BANDAGE ESMARK 6X9 LF (GAUZE/BANDAGES/DRESSINGS) ×2 IMPLANT
BLADE ARTHRO LOK 4 BEAVER (BLADE) IMPLANT
BLADE AVERAGE 25X9 (BLADE) IMPLANT
BLADE MICRO SAGITTAL (BLADE) IMPLANT
BLADE MINI RND TIP GREEN BEAV (BLADE) IMPLANT
BLADE OSC/SAG .038X5.5 CUT EDG (BLADE) IMPLANT
BLADE SURG 15 STRL LF DISP TIS (BLADE) ×4 IMPLANT
BLADE SURG 15 STRL SS (BLADE) ×8
BNDG ELASTIC 4X5.8 VLCR STR LF (GAUZE/BANDAGES/DRESSINGS) ×2 IMPLANT
BNDG ELASTIC 6X5.8 VLCR STR LF (GAUZE/BANDAGES/DRESSINGS) ×2 IMPLANT
BNDG ESMARK 6X9 LF (GAUZE/BANDAGES/DRESSINGS) ×2
BOOT STEPPER DURA LG (SOFTGOODS) IMPLANT
BOOT STEPPER DURA MED (SOFTGOODS) IMPLANT
BOOT STEPPER DURA XLG (SOFTGOODS) IMPLANT
CANISTER SUCT 1200ML W/VALVE (MISCELLANEOUS) ×2 IMPLANT
CHLORAPREP W/TINT 26 (MISCELLANEOUS) ×2 IMPLANT
COVER BACK TABLE 60X90IN (DRAPES) ×2 IMPLANT
CUFF TOURN SGL QUICK 34 (TOURNIQUET CUFF)
CUFF TRNQT CYL 34X4.125X (TOURNIQUET CUFF) IMPLANT
DRAPE EXTREMITY T 121X128X90 (DISPOSABLE) ×2 IMPLANT
DRAPE OEC MINIVIEW 54X84 (DRAPES) IMPLANT
DRAPE U-SHAPE 47X51 STRL (DRAPES) ×2 IMPLANT
DRSG MEPITEL 4X7.2 (GAUZE/BANDAGES/DRESSINGS) ×2 IMPLANT
ELECT REM PT RETURN 9FT ADLT (ELECTROSURGICAL) ×2
ELECTRODE REM PT RTRN 9FT ADLT (ELECTROSURGICAL) ×2 IMPLANT
GAUZE PAD ABD 8X10 STRL (GAUZE/BANDAGES/DRESSINGS) ×4 IMPLANT
GAUZE SPONGE 4X4 12PLY STRL (GAUZE/BANDAGES/DRESSINGS) ×2 IMPLANT
GAUZE STRETCH 2X75IN STRL (MISCELLANEOUS) IMPLANT
GLOVE BIO SURGEON STRL SZ8 (GLOVE) ×2 IMPLANT
GLOVE BIOGEL PI IND STRL 7.0 (GLOVE) IMPLANT
GLOVE BIOGEL PI IND STRL 8 (GLOVE) ×4 IMPLANT
GLOVE ECLIPSE 8.0 STRL XLNG CF (GLOVE) ×2 IMPLANT
GLOVE SURG SS PI 6.5 STRL IVOR (GLOVE) IMPLANT
GLOVE SURG SS PI 7.0 STRL IVOR (GLOVE) IMPLANT
GOWN STRL REUS W/ TWL LRG LVL3 (GOWN DISPOSABLE) ×2 IMPLANT
GOWN STRL REUS W/ TWL XL LVL3 (GOWN DISPOSABLE) ×4 IMPLANT
GOWN STRL REUS W/TWL LRG LVL3 (GOWN DISPOSABLE) ×2
GOWN STRL REUS W/TWL XL LVL3 (GOWN DISPOSABLE) ×4
IMPL TAPESTRY BIOINTEGR 70X50 (Orthopedic Implant) IMPLANT
IMPL TAPESTRY BIOINTGRTV 70X50 (Orthopedic Implant) ×2 IMPLANT
KIT DRILL 7 (KITS) IMPLANT
KIT TRANSTIBIAL (DISPOSABLE) IMPLANT
NDL HYPO 25X1 1.5 SAFETY (NEEDLE) IMPLANT
NDL SUT 6 .5 CRC .975X.05 MAYO (NEEDLE) IMPLANT
NEEDLE HYPO 22GX1.5 SAFETY (NEEDLE) IMPLANT
NEEDLE HYPO 25X1 1.5 SAFETY (NEEDLE) IMPLANT
NEEDLE MAYO TAPER (NEEDLE)
NS IRRIG 1000ML POUR BTL (IV SOLUTION) ×2 IMPLANT
PACK BASIN DAY SURGERY FS (CUSTOM PROCEDURE TRAY) ×2 IMPLANT
PAD CAST 4YDX4 CTTN HI CHSV (CAST SUPPLIES) ×2 IMPLANT
PADDING CAST ABS COTTON 4X4 ST (CAST SUPPLIES) IMPLANT
PADDING CAST COTTON 4X4 STRL (CAST SUPPLIES) ×2
PADDING CAST COTTON 6X4 STRL (CAST SUPPLIES) ×2 IMPLANT
PASSER SUT SWANSON 36MM LOOP (INSTRUMENTS) IMPLANT
PENCIL SMOKE EVACUATOR (MISCELLANEOUS) ×2 IMPLANT
RETRIEVER SUT HEWSON (MISCELLANEOUS) IMPLANT
SANITIZER HAND PURELL FF 515ML (MISCELLANEOUS) ×2 IMPLANT
SCREW QUATRO BOLT 8X16 (Screw) IMPLANT
SHEET MEDIUM DRAPE 40X70 STRL (DRAPES) ×2 IMPLANT
SLEEVE SCD COMPRESS KNEE MED (STOCKING) ×2 IMPLANT
SPIKE FLUID TRANSFER (MISCELLANEOUS) IMPLANT
SPLINT PLASTER CAST FAST 5X30 (CAST SUPPLIES) ×40 IMPLANT
SPONGE T-LAP 18X18 ~~LOC~~+RFID (SPONGE) ×2 IMPLANT
STAPLER VISISTAT 35W (STAPLE) IMPLANT
STOCKINETTE 6  STRL (DRAPES) ×2
STOCKINETTE 6 STRL (DRAPES) ×2 IMPLANT
SUCTION FRAZIER HANDLE 10FR (MISCELLANEOUS) ×2
SUCTION TUBE FRAZIER 10FR DISP (MISCELLANEOUS) ×2 IMPLANT
SUT ETHILON 3 0 PS 1 (SUTURE) ×2 IMPLANT
SUT FIBERWIRE #2 38 T-5 BLUE (SUTURE)
SUT MERSILENE 2.0 SH NDLE (SUTURE) IMPLANT
SUT MNCRL AB 3-0 PS2 18 (SUTURE) ×2 IMPLANT
SUT POLY BUTTON 15MM (SUTURE) IMPLANT
SUT VIC AB 0 SH 27 (SUTURE) IMPLANT
SUT VIC AB 1 CT1 27 (SUTURE) ×4
SUT VIC AB 1 CT1 27XBRD ANBCTR (SUTURE) IMPLANT
SUT VIC AB 2-0 SH 27 (SUTURE)
SUT VIC AB 2-0 SH 27XBRD (SUTURE) IMPLANT
SUT VICRYL 0 SH 27 (SUTURE) ×2 IMPLANT
SUTURE FIBERWR #2 38 T-5 BLUE (SUTURE) IMPLANT
SUTURE TAPE 1.3 FIBERLOP 20 ST (SUTURE) IMPLANT
SUTURETAPE 1.3 FIBERLOOP 20 ST (SUTURE)
SYR BULB EAR ULCER 3OZ GRN STR (SYRINGE) ×2 IMPLANT
SYR CONTROL 10ML LL (SYRINGE) IMPLANT
TOWEL GREEN STERILE FF (TOWEL DISPOSABLE) ×4 IMPLANT
TUBE CONNECTING 20X1/4 (TUBING) ×2 IMPLANT
UNDERPAD 30X36 HEAVY ABSORB (UNDERPADS AND DIAPERS) ×2 IMPLANT
YANKAUER SUCT BULB TIP NO VENT (SUCTIONS) IMPLANT

## 2022-12-10 NOTE — Discharge Instructions (Addendum)
Wylene Simmer, MD [facility]  Please read the following information regarding your care after surgery.  Medications  You only need a prescription for the narcotic pain medicine (ex. oxycodone, Percocet, Norco).  All of the other medicines listed below are available over the counter. X Aleve 2 pills twice a day for the first 3 days after surgery. X acetominophen (Tylenol) 650 mg every 4-6 hours as you need for minor to moderate pain X oxycodone as prescribed for severe pain  Narcotic pain medicine (ex. oxycodone, Percocet, Vicodin) will cause constipation.  To prevent this problem, take the following medicines while you are taking any pain medicine. X docusate sodium (Colace) 100 mg twice a day X senna (Senokot) 2 tablets twice a day  X To help prevent blood clots, take Xarelto starting the day after surgery.  When you have finished the Xarelto prescription, take a baby aspirin (81 mg) twice a day for another month.  You should also get up every hour while you are awake to move around.    Weight Bearing X Do not bear any weight on the operated leg or foot.  Cast / Splint / Dressing X Keep your splint, cast or dressing clean and dry.  Don't put anything (coat hanger, pencil, etc) down inside of it.  If it gets damp, use a hair dryer on the cool setting to dry it.  If it gets soaked, call the office to schedule an appointment for a cast change.  After your dressing, cast or splint is removed; you may shower, but do not soak or scrub the wound.  Allow the water to run over it, and then gently pat it dry.  Swelling It is normal for you to have swelling where you had surgery.  To reduce swelling and pain, keep your toes above your nose for at least 3 days after surgery.  It may be necessary to keep your foot or leg elevated for several weeks.  If it hurts, it should be elevated.  Follow Up Call my office at [phone] when you are discharged from the hospital or surgery center to schedule an  appointment to be seen two weeks after surgery.  Call my office at [phone] if you develop a fever >101.5 F, nausea, vomiting, bleeding from the surgical site or severe pain.       Post Anesthesia Home Care Instructions  Activity: Get plenty of rest for the remainder of the day. A responsible individual must stay with you for 24 hours following the procedure.  For the next 24 hours, DO NOT: -Drive a car -Paediatric nurse -Drink alcoholic beverages -Take any medication unless instructed by your physician -Make any legal decisions or sign important papers.  Meals: Start with liquid foods such as gelatin or soup. Progress to regular foods as tolerated. Avoid greasy, spicy, heavy foods. If nausea and/or vomiting occur, drink only clear liquids until the nausea and/or vomiting subsides. Call your physician if vomiting continues.  Special Instructions/Symptoms: Your throat may feel dry or sore from the anesthesia or the breathing tube placed in your throat during surgery. If this causes discomfort, gargle with warm salt water. The discomfort should disappear within 24 hours.  If you had a scopolamine patch placed behind your ear for the management of post- operative nausea and/or vomiting:  1. The medication in the patch is effective for 72 hours, after which it should be removed.  Wrap patch in a tissue and discard in the trash. Wash hands thoroughly with soap and  water. 2. You may remove the patch earlier than 72 hours if you experience unpleasant side effects which may include dry mouth, dizziness or visual disturbances. 3. Avoid touching the patch. Wash your hands with soap and water after contact with the patch.    Tylenol may be given after 4:53pm

## 2022-12-10 NOTE — Anesthesia Procedure Notes (Signed)
Anesthesia Regional Block: Adductor canal block   Pre-Anesthetic Checklist: , timeout performed,  Correct Patient, Correct Site, Correct Laterality,  Correct Procedure, Correct Position, site marked,  Risks and benefits discussed,  Pre-op evaluation,  At surgeon's request and post-op pain management  Laterality: Left  Prep: Maximum Sterile Barrier Precautions used, chloraprep       Needles:  Injection technique: Single-shot  Needle Type: Echogenic Stimulator Needle     Needle Length: 9cm  Needle Gauge: 21     Additional Needles:   Procedures:,,,, ultrasound used (permanent image in chart),,    Narrative:  Start time: 12/10/2022 11:28 AM End time: 12/10/2022 11:32 AM Injection made incrementally with aspirations every 5 mL. Anesthesiologist: Freddrick March, MD

## 2022-12-10 NOTE — Anesthesia Preprocedure Evaluation (Addendum)
Anesthesia Evaluation  Patient identified by MRN, date of birth, ID band Patient awake    Reviewed: Allergy & Precautions, NPO status , Patient's Chart, lab work & pertinent test results  Airway Mallampati: I  TM Distance: >3 FB Neck ROM: Full    Dental  (+) Chipped, Dental Advisory Given,    Pulmonary sleep apnea and Continuous Positive Airway Pressure Ventilation    Pulmonary exam normal breath sounds clear to auscultation       Cardiovascular hypertension, Pt. on medications Normal cardiovascular exam Rhythm:Regular Rate:Normal     Neuro/Psych  PSYCHIATRIC DISORDERS  Depression    negative neurological ROS     GI/Hepatic negative GI ROS, Neg liver ROS,,,  Endo/Other    Morbid obesity  Renal/GU negative Renal ROS  negative genitourinary   Musculoskeletal negative musculoskeletal ROS (+)    Abdominal   Peds  Hematology negative hematology ROS (+)   Anesthesia Other Findings   Reproductive/Obstetrics                             Anesthesia Physical Anesthesia Plan  ASA: 3  Anesthesia Plan: General and Regional   Post-op Pain Management: Regional block* and Tylenol PO (pre-op)*   Induction: Intravenous  PONV Risk Score and Plan: 2 and Ondansetron, Dexamethasone and Midazolam  Airway Management Planned: Oral ETT  Additional Equipment:   Intra-op Plan:   Post-operative Plan: Extubation in OR  Informed Consent: I have reviewed the patients History and Physical, chart, labs and discussed the procedure including the risks, benefits and alternatives for the proposed anesthesia with the patient or authorized representative who has indicated his/her understanding and acceptance.     Dental advisory given  Plan Discussed with: CRNA  Anesthesia Plan Comments:        Anesthesia Quick Evaluation

## 2022-12-10 NOTE — Progress Notes (Signed)
Assisted Dr. Woodrum with left, adductor canal, popliteal, ultrasound guided block. Side rails up, monitors on throughout procedure. See vital signs in flow sheet. Tolerated Procedure well. 

## 2022-12-10 NOTE — Anesthesia Procedure Notes (Signed)
Anesthesia Regional Block: Popliteal block   Pre-Anesthetic Checklist: , timeout performed,  Correct Patient, Correct Site, Correct Laterality,  Correct Procedure, Correct Position, site marked,  Risks and benefits discussed,  Pre-op evaluation,  At surgeon's request and post-op pain management  Laterality: Left  Prep: Maximum Sterile Barrier Precautions used, chloraprep       Needles:  Injection technique: Single-shot  Needle Type: Echogenic Stimulator Needle     Needle Length: 9cm  Needle Gauge: 21     Additional Needles:   Procedures:,,,, ultrasound used (permanent image in chart),,    Narrative:  Start time: 12/10/2022 11:25 AM End time: 12/10/2022 11:28 AM Injection made incrementally with aspirations every 5 mL. Anesthesiologist: Freddrick March, MD

## 2022-12-10 NOTE — Op Note (Signed)
12/10/2022  2:09 PM  PATIENT:  Jason Ingram  67 y.o. male  PRE-OPERATIVE DIAGNOSIS:  1.  Chronic neglected left achilles tendon rupture     2.  Short left achilles tendon  POST-OPERATIVE DIAGNOSIS:  same  Procedure(s): 1.  Left gastrocnemius recession 2.  Left deep posterior compartment fasciotomy 3.  Deep transfer of the flexor hallucis longus tendon to the calcaneus 4.  Left Achilles tendon reconstruction with allograft augmentation  SURGEON:  Wylene Simmer, MD  ASSISTANT: None  ANESTHESIA:   General, regional  EBL:  minimal   TOURNIQUET:   Total Tourniquet Time Documented: Thigh (Left) - 99 minutes Total: Thigh (Left) - 99 minutes  COMPLICATIONS:  None apparent  DISPOSITION:  Extubated, awake and stable to recovery.  INDICATION FOR PROCEDURE: 67 year old male without significant past medical history ruptured his Achilles tendon back on July 1.  He did not seek any treatment until many months later.  An MRI revealed a chronic rupture of the Achilles.  His gastrocnemius has now contracted with a short Achilles tendon proximally.  He has failed nonoperative treatment and presents for surgical reconstruction of his Achilles tendon with gastrocnemius recession.  The risks and benefits of the alternative treatment options have been discussed in detail.  The patient wishes to proceed with surgery and specifically understands risks of bleeding, infection, nerve damage, blood clots, need for additional surgery, amputation and death.   PROCEDURE IN DETAIL: After preoperative consent was obtained and the correct operative site was identified, the patient was brought to the operating room supine on the stretcher.  General anesthesia was induced.  Preoperative antibiotics were administered.  Surgical timeout was taken.  The left lower extremity was exsanguinated and a thigh tourniquet inflated to 300 mmHg.  The patient was then turned to the prone position on the operating table with all bony  prominences padded well.  The left lower extremity was then prepped and draped in standard sterile fashion.  An incision was made over the gastrocnemius tendon.  Dissection was carried sharply down through the subcutaneous tissues.  Care was taken to protect the sural nerve and lesser saphenous vein.  Superficial fascia was incised.  The gastrocnemius tendon was identified.  It was divided under direct vision medially and laterally.  The wound was irrigated and sprinkled with vancomycin powder.  Subcutaneous tissues were approximated with Monocryl.  The skin incision was closed with nylon.  Attention was turned to the posterior aspect of the heel where an incision was made in the midline at the palpable defect.  The incision was carried down through the subcutaneous tissues to the peritenon.  The peritenon was incised exposing the chronic Achilles tendon rupture.  The incision was extended proximally and distally until adequate exposure of the tendon stumps was achieved.  The ruptured ends of the tendon were debrided of all degenerative tissue, hematoma and fibrosis.  The deep fascia over the posterior compartment was identified.  A fasciotomy was performed releasing the fascia from the calcaneus to the musculotendinous junction.  The flexor hallucis longus muscle was identified.  Dissection was carried distally to the tarsal tunnel.  The tibial nerve was identified.  It was retracted medially and was protected.  The ankle and hallux were maximally plantarflexed.  The FHL tendon was transected in the tarsal tunnel.  A whipstitch suture was placed in the tendon end.  The periosteum over the Haglund deformity was incised exposing the bone.  A guidepin was drilled down into the calcaneus and out  the plantar aspect of the foot.  The guidewire was overdrilled creating a tunnel for the deep tendon transfer.  The FHL tendon sutures were passed through the tunnel and out the plantar surface of the foot.  The FHL tendon  was then pulled down into the tunnel with the ankle maximally plantarflexed.  The tendon was secured within the tunnel with a [facility] medical bolt.  The plantar sutures were then tied over a button padded with 4 x 4 gauze.  Attention was returned to the Achilles.  The tendon ends were approximated and repaired with #1 Vicryl figure-of-eight and horizontal mattress sutures.  A Tapestry allograft was wrapped around the repair site circumferentially.  It was secured with a running Silfverskiold suture of 3-0 Monocryl placed around the Achilles tendon repair site.  The wound was irrigated copiously and sprinkled with vancomycin powder.  The peritenon was repaired over the tendon with inverted simple sutures of 2-0 Vicryl.  The skin incision was closed with horizontal mattress sutures of 3-0 nylon.  Sterile dressings were applied followed by a well-padded short leg splint.  The tourniquet was released after application of the dressings.  The patient was awakened from anesthesia and transported to the recovery room in stable condition.   FOLLOW UP PLAN: Nonweightbearing on the right lower extremity for the next 2 months.  Gentle gradual dorsiflexion of the ankle to neutral.  Once he can safely reach neutral without any pain then he can begin bearing weight in a cam boot with multiple heel lifts.  Xarelto for DVT prophylaxis for 2 weeks followed by aspirin 81 mg daily for a month.

## 2022-12-10 NOTE — H&P (Signed)
Jason Ingram is an 67 y.o. male.   Chief Complaint: Left ankle pain HPI: 67 year old male without significant past medical history injured his left ankle in July of this year.  He sustained an Achilles rupture but was not evaluated for this injury and he will late in the year.  An MRI confirms a neglected Achilles rupture.  He presents today for reconstruction of his Achilles tendon possibly with allograft as well as gastrocnemius recession and FHL transfer to the calcaneus.  Past Medical History:  Diagnosis Date   Depression    High cholesterol    History of broken nose    History of pneumonia 1998   Hx of colonoscopy 2013   Hypertension    Injury of pelvis 1997   Sleep apnea     Past Surgical History:  Procedure Laterality Date   COLONOSCOPY  05/2012   Dr. Gerline Legacy   FRACTURE SURGERY     HAND SURGERY  2000   Thumb Repair   JOINT REPLACEMENT     KNEE SURGERY  12/07/1976   [facility]   KNEE SURGERY  2007   SKIN GRAFT  1962   [facility]    TOTAL KNEE ARTHROPLASTY Left 11/08/2017   Procedure: LEFT TOTAL KNEE ARTHROPLASTY;  Surgeon: Gaynelle Arabian, MD;  Location: [facility]S;  Service: Orthopedics;  Laterality: Left;    Family History  Problem Relation Age of Onset   Lymphoma Mother    Bipolar disorder Son    Heart disease Other        multiple   Social History:  reports that he has never smoked. He has never used smokeless tobacco. He reports current alcohol use of about 1.0 standard drink of alcohol per week. He reports that he does not use drugs.  Allergies: No Known Allergies  Medications Prior to Admission  Medication Sig Dispense Refill   amLODipine (NORVASC) 5 MG tablet Take 5 mg by mouth daily.     cloNIDine (CATAPRES) 0.3 MG tablet TAKE 1 TABLET(0.3 MG) BY MOUTH DAILY 90 tablet 1   escitalopram (LEXAPRO) 10 MG tablet Take 1 tablet (10 mg total) by mouth daily. 90 tablet 3   finasteride (PROSCAR) 5 MG tablet Take 1 tablet (5 mg total) by mouth daily. 30 tablet 1    lisinopril (ZESTRIL) 10 MG tablet Take 10 mg by mouth daily.     oxybutynin (DITROPAN-XL) 10 MG 24 hr tablet Take 10 mg by mouth at bedtime.     tamsulosin (FLOMAX) 0.4 MG CAPS capsule Take 0.4 mg daily after breakfast by mouth.     topiramate (TOPAMAX) 25 MG capsule Take 25 mg by mouth 2 (two) times daily.      No results found for this or any previous visit (from the past 48 hour(s)). No results found.  Review of Systems no recent fever, chills, nausea, vomiting or changes in his appetite  Blood pressure 134/73, pulse 60, temperature (!) 97.2 F (36.2 C), temperature source Oral, resp. rate 15, height 6\' 5"  (1.956 m), weight (!) 155.9 kg, SpO2 93 %. Physical Exam  Well-nourished well-developed man in no apparent distress.  Alert and oriented.  Normal mood and affect.  Gait is antalgic to the left.  The left ankle has 4-5 strength in plantarflexion.  Tender to palpation along the Achilles.  There is a palpable defect proximal from the insertion.  Skin is healthy and intact.  Pulses are palpable in the left foot.  No lymphadenopathy.  Assessment/Plan Chronic left Achilles tendon rupture  with gastrocnemius contracture -to the operating room today for left gastrocnemius recession, FHL transfer to the calcaneus and Achilles tendon reconstruction.  The risks and benefits of the alternative treatment options have been discussed in detail.  The patient wishes to proceed with surgery and specifically understands risks of bleeding, infection, nerve damage, blood clots, need for additional surgery, amputation and death.   Wylene Simmer, MD 12-Dec-2022, 11:40 AM

## 2022-12-10 NOTE — Transfer of Care (Signed)
Immediate Anesthesia Transfer of Care Note  Patient: Jason Ingram  Procedure(s) Performed: ACHILLES TENDON RECONSTRUCTION (Left: Foot) GASTROCNEMIUS RECESSION, FLEXOR HALLUCIUS LONGUS TENDON TRANSFER TO CALCANEUS (Left: Leg Lower) FLEXOR HALLUCIUS LONGUS TENDON TRANSFER TO CALCANEUS (Left: Foot)  Patient Location: PACU  Anesthesia Type:GA combined with regional for post-op pain  Level of Consciousness: sedated  Airway & Oxygen Therapy: Patient Spontanous Breathing and Patient connected to face mask oxygen  Post-op Assessment: Report given to RN and Post -op Vital signs reviewed and stable  Post vital signs: Reviewed and stable  Last Vitals:  Vitals Value Taken Time  BP    Temp 36.2 C 12/10/22 1413  Pulse 59 12/10/22 1420  Resp 16 12/10/22 1420  SpO2 94 % 12/10/22 1420  Vitals shown include unvalidated device data.  Last Pain:  Vitals:   12/10/22 1036  TempSrc: Oral  PainSc: 0-No pain      Patients Stated Pain Goal: 3 (74/08/14 4818)  Complications:  Encounter Notable Events  Notable Event Outcome Phase Comment  Difficult to intubate - expected  Intraprocedure Filed from anesthesia note documentation.

## 2022-12-10 NOTE — Anesthesia Procedure Notes (Signed)
Procedure Name: Intubation Date/Time: 12/10/2022 12:06 PM  Performed by: Glessie Eustice, Ernesta Amble, CRNAPre-anesthesia Checklist: Patient identified, Emergency Drugs available, Suction available and Patient being monitored Patient Re-evaluated:Patient Re-evaluated prior to induction Oxygen Delivery Method: Circle system utilized Preoxygenation: Pre-oxygenation with 100% oxygen Induction Type: IV induction Ventilation: Mask ventilation without difficulty Laryngoscope Size: Mac, 3 and Glidescope Grade View: Grade III Tube type: Oral Tube size: 7.5 mm Number of attempts: 2 Airway Equipment and Method: Stylet, Oral airway and Video-laryngoscopy Placement Confirmation: ETT inserted through vocal cords under direct vision, positive ETCO2 and breath sounds checked- equal and bilateral Secured at: 22 cm Tube secured with: Tape Dental Injury: Teeth and Oropharynx as per pre-operative assessment  Difficulty Due To: Difficulty was anticipated Comments: Pt with multiple chipped teeth from basketball incident at age 67. Smooth IV induction , easy mask airway, DL x 1 with Mac 3 unable to see VC, DL with glide scope VC visualizes easy atraumatic placement of 7.5 ETT BBS, +ETCO2, teeth and lips unchanged. Small mouth, narrow palate

## 2022-12-11 ENCOUNTER — Encounter (HOSPITAL_BASED_OUTPATIENT_CLINIC_OR_DEPARTMENT_OTHER): Payer: Self-pay | Admitting: Orthopedic Surgery

## 2022-12-11 NOTE — Anesthesia Postprocedure Evaluation (Signed)
Anesthesia Post Note  Patient: Jason Ingram  Procedure(s) Performed: ACHILLES TENDON RECONSTRUCTION (Left: Foot) GASTROCNEMIUS RECESSION, FLEXOR HALLUCIUS LONGUS TENDON TRANSFER TO CALCANEUS (Left: Leg Lower) FLEXOR HALLUCIUS LONGUS TENDON TRANSFER TO CALCANEUS (Left: Foot)     Patient location during evaluation: PACU Anesthesia Type: Regional and General Level of consciousness: awake and alert Pain management: pain level controlled Vital Signs Assessment: post-procedure vital signs reviewed and stable Respiratory status: spontaneous breathing, nonlabored ventilation, respiratory function stable and patient connected to nasal cannula oxygen Cardiovascular status: blood pressure returned to baseline and stable Postop Assessment: no apparent nausea or vomiting Anesthetic complications: yes  Encounter Notable Events  Notable Event Outcome Phase Comment  Difficult to intubate - expected  Intraprocedure Filed from anesthesia note documentation.    Last Vitals:  Vitals:   12/10/22 1445 12/10/22 1500  BP:  116/64  Pulse:    Resp:  16  Temp:  37.1 C  SpO2: 94% 95%    Last Pain:  Vitals:   12/10/22 1515  TempSrc:   PainSc: 1                  Holt Woolbright L Nilesh Stegall

## 2022-12-17 ENCOUNTER — Ambulatory Visit: Payer: BC Managed Care – PPO | Admitting: Mental Health

## 2023-03-17 ENCOUNTER — Ambulatory Visit (INDEPENDENT_AMBULATORY_CARE_PROVIDER_SITE_OTHER): Payer: Medicare Other | Admitting: Pulmonary Disease

## 2023-03-17 ENCOUNTER — Encounter: Payer: Self-pay | Admitting: Pulmonary Disease

## 2023-03-17 VITALS — BP 138/82 | HR 71 | Ht 77.0 in | Wt 359.6 lb

## 2023-03-17 DIAGNOSIS — G4733 Obstructive sleep apnea (adult) (pediatric): Secondary | ICD-10-CM

## 2023-03-17 NOTE — Progress Notes (Signed)
Jason Ingram    [id number]    [birth year]  Primary Care Physician:Chow, Zannie Cove, MD  Referring Physician: Penelope Galas, MD [address] [place],  [place] [address]  Chief complaint:   Patient with a history of obstructive sleep apnea Machine is becoming dysfunctional  HPI:  Was last seen about 2 years ago  Continues to use his machine on a nightly basis Has the machine all taped up and machine does not behave like it is working well currently  He still uses it nightly Still wakes up feeling like he is at a good nights rest  Machine is at least about 68 years old  Has recently gained about 20 to 30 pounds from having an ankle issue for which she recently had surgery  Diagnosed with severe obstructive sleep apnea about 7 years ago and has been using CPAP since  Gets about 10 hours of sleep Still has significant snoring, multiple awakenings without CPAP on  Feels well rested whenever he uses his CPAP  He usually feels well when he wakes up in the morning Usually goes to bed between 10 and 11 PM, takes about 20 minutes to fall asleep About 2 awakenings Final wake up time about 7 AM  He has high blood pressure, hypercholesterolemia well controlled  He is active Never smoker  No pertinent occupational history  Divorced  Outpatient Encounter Medications as of 03/17/2023  Medication Sig   amLODipine (NORVASC) 5 MG tablet Take 5 mg by mouth daily.   cloNIDine (CATAPRES) 0.3 MG tablet TAKE 1 TABLET(0.3 MG) BY MOUTH DAILY   escitalopram (LEXAPRO) 10 MG tablet Take 1 tablet (10 mg total) by mouth daily.   finasteride (PROSCAR) 5 MG tablet Take 1 tablet (5 mg total) by mouth daily.   lisinopril (ZESTRIL) 10 MG tablet Take 10 mg by mouth daily.   oxybutynin (DITROPAN-XL) 10 MG 24 hr tablet Take 10 mg by mouth at bedtime.   rivaroxaban (XARELTO) 10 MG TABS tablet Take 1 tablet (10 mg total) by mouth daily.   tamsulosin (FLOMAX) 0.4 MG CAPS capsule Take 0.4  mg daily after breakfast by mouth.   topiramate (TOPAMAX) 25 MG capsule Take 25 mg by mouth 2 (two) times daily.   No facility-administered encounter medications on file as of 03/17/2023.    Allergies as of 03/17/2023   (No Known Allergies)    Past Medical History:  Diagnosis Date   Depression    High cholesterol    History of broken nose    History of pneumonia 1998   Hx of colonoscopy 2013   Hypertension    Injury of pelvis 1997   Sleep apnea     Past Surgical History:  Procedure Laterality Date   ACHILLES TENDON SURGERY Left 12/10/2022   Procedure: ACHILLES TENDON RECONSTRUCTION;  Surgeon: Toni Arthurs, MD;  Location: [facility];  Service: Orthopedics;  Laterality: Left;   COLONOSCOPY  05/2012   Dr. Carlisle Cater   FRACTURE SURGERY     GASTROCNEMIUS RECESSION Left 12/10/2022   Procedure: GASTROCNEMIUS RECESSION, FLEXOR HALLUCIUS LONGUS TENDON TRANSFER TO CALCANEUS;  Surgeon: Toni Arthurs, MD;  Location: [facility];  Service: Orthopedics;  Laterality: Left;   HAND SURGERY  2000   Thumb Repair   JOINT REPLACEMENT     KNEE SURGERY  12/07/1976   [facility]   KNEE SURGERY  2007   SKIN GRAFT  1962   [facility]  TENDON TRANSFER Left 12/10/2022   Procedure: FLEXOR HALLUCIUS LONGUS TENDON TRANSFER TO CALCANEUS;  Surgeon: Toni Arthurs, MD;  Location: [facility];  Service: Orthopedics;  Laterality: Left;   TOTAL KNEE ARTHROPLASTY Left 11/08/2017   Procedure: LEFT TOTAL KNEE ARTHROPLASTY;  Surgeon: Ollen Gross, MD;  Location: [facility];  Service: Orthopedics;  Laterality: Left;    Family History  Problem Relation Age of Onset   Lymphoma Mother    Bipolar disorder Son    Heart disease Other        multiple    Social History   Socioeconomic History   Marital status: Married    Spouse name: Not on file   Number of children: Not on file   Years of education: Not on file   Highest education level: Not on file  Occupational History    Occupation: Art gallery manager  Tobacco Use   Smoking status: Never   Smokeless tobacco: Never  Vaping Use   Vaping Use: Never used  Substance and Sexual Activity   Alcohol use: Yes    Alcohol/week: 1.0 standard drink of alcohol    Types: 1 Standard drinks or equivalent per week    Comment: 4 drinks weekly   Drug use: No   Sexual activity: Not on file  Other Topics Concern   Not on file  Social History Narrative   Diet: N/A   Caffeine: Yes    Married: Yes, 2016   House: House, 2 stories    Pets: 2 Dogs   Current/Past profession: Building services engineer    Exercise: Yes, daily   Living Will: No   DNR: No   POA/HPOA: No    Social Determinants of Corporate investment banker Strain: Not on file  Food Insecurity: Not on file  Transportation Needs: Not on file  Physical Activity: Not on file  Stress: Not on file  Social Connections: Not on file  Intimate Partner Violence: Not on file    Review of Systems  Respiratory:  Positive for apnea. Negative for shortness of breath.   Psychiatric/Behavioral:  Positive for sleep disturbance.     There were no vitals filed for this visit.    Physical Exam Constitutional:      Appearance: He is obese.  HENT:     Head: Normocephalic.     Nose: No congestion.     Mouth/Throat:     Mouth: Mucous membranes are moist.     Comments: Mallampati 4, crowded oropharynx Eyes:     Pupils: Pupils are equal, round, and reactive to light.  Cardiovascular:     Rate and Rhythm: Normal rate and regular rhythm.     Heart sounds: No murmur heard.    No friction rub.  Pulmonary:     Effort: No respiratory distress.     Breath sounds: No stridor. No wheezing or rhonchi.  Musculoskeletal:     Cervical back: No rigidity or tenderness.  Neurological:     Mental Status: He is alert.  Psychiatric:        Mood and Affect: Mood normal.   Epworth Sleepiness Scale of 4  Data Reviewed: Compliance data reviewed showing 100% compliance-compliance data  for the last 90 days Average use of 10 hours Machine set at 10 Residual AHI 1.9  Assessment:  Severe obstructive sleep apnea -Compliant with CPAP use -Continues to benefit from CPAP -Wakes up feeling like he is at a good nights rest  Machine is dysfunctional, at least 67 years old taped  up with some leaks -Needs a new machine  Obesity -Has gained some weight recently secondary to having to have ankle surgery  Pathophysiology of sleep disordered breathing reviewed   Plan/Recommendations:  Will place an order for CPAP  CPAP of 10 with EPR 1 with heated humidification  Order will be going to [facility]  He continues to use CPAP on a nightly basis and benefits from CPAP use  Virl DiamondAdewale Zahirah Cheslock MD [facility] 03/17/2023, 9:51 AM  CC: Chow, Zannie CoveAndrew O, MD

## 2023-03-17 NOTE — Patient Instructions (Signed)
We will place a prescription for CPAP to go to [facility]  CPAP of 10 with EPR of 1 with heated humidification  Continue using current machine until you are able to get a new machine  Weight loss efforts as we discussed  Tentative follow-up in about 6 months

## 2023-04-21 ENCOUNTER — Ambulatory Visit: Payer: BC Managed Care – PPO | Admitting: Adult Health

## 2023-04-29 ENCOUNTER — Telehealth: Payer: Medicare Other | Admitting: Adult Health

## 2023-04-29 ENCOUNTER — Encounter: Payer: Self-pay | Admitting: Adult Health

## 2023-04-29 DIAGNOSIS — F32A Depression, unspecified: Secondary | ICD-10-CM

## 2023-04-29 DIAGNOSIS — F411 Generalized anxiety disorder: Secondary | ICD-10-CM

## 2023-04-29 MED ORDER — ESCITALOPRAM OXALATE 10 MG PO TABS: 10.0000 mg | ORAL_TABLET | Freq: Every day | ORAL | 3 refills | Status: DC

## 2023-04-29 NOTE — Progress Notes (Signed)
Jason Ingram [id number] 10-07-1956 67 y.o.  Virtual Visit via Video Note  I connected with pt @ on 04/29/23 at  2:40 PM EDT by a video enabled telemedicine application and verified that I am speaking with the correct person using two identifiers.   I discussed the limitations of evaluation and management by telemedicine and the availability of in person appointments. The patient expressed understanding and agreed to proceed.  I discussed the assessment and treatment plan with the patient. The patient was provided an opportunity to ask questions and all were answered. The patient agreed with the plan and demonstrated an understanding of the instructions.   The patient was advised to call back or seek an in-person evaluation if the symptoms worsen or if the condition fails to improve as anticipated.  I provided 10 minutes of non-face-to-face time during this encounter.  The patient was located at home.  The provider was located at [facility].   Jason Gibbs, NP   Subjective:   Patient ID:  Jason Ingram is a 67 y.o. (DOB 1956/06/02) male.  Chief Complaint: No chief complaint on file.   HPI ORAS SPLITT presents for follow-up of depression and anxiety.  Describes mood today as "ok". Pleasant. Mood symptoms - denies depression, anxiety, and irritability Mood is consistent. Stating "I'm doing really good". Feels like medication continues to work well. Seeing therapist - Elio Forget. Stable interest and motivation. Energy levels stable. Active, does not have a regular exercise routine.  Enjoys some usual interests and activities. Dating. Lives alone. Has 2 grown children.  Appetite adequate. Weight gain 320 to 350 pounds.   Sleeps well most nights. Averages 8 hours. Focus and concentration stable. Completing tasks. Managing aspects of household. Working full time. Denies SI or HI.  Denies AH or VH.  Previous medications: Paxil, Zoloft   Review of Systems:   Review of Systems  Musculoskeletal:  Negative for gait problem.  Neurological:  Negative for tremors.  Psychiatric/Behavioral:         Please refer to HPI    Medications: I have reviewed the patient's current medications.  Current Outpatient Medications  Medication Sig Dispense Refill   amLODipine (NORVASC) 5 MG tablet Take 5 mg by mouth daily.     escitalopram (LEXAPRO) 10 MG tablet Take 1 tablet (10 mg total) by mouth daily. 90 tablet 3   finasteride (PROSCAR) 5 MG tablet Take 1 tablet (5 mg total) by mouth daily. 30 tablet 1   lisinopril (ZESTRIL) 10 MG tablet Take 10 mg by mouth daily.     oxybutynin (DITROPAN-XL) 10 MG 24 hr tablet Take 10 mg by mouth at bedtime.     tamsulosin (FLOMAX) 0.4 MG CAPS capsule Take 0.4 mg daily after breakfast by mouth.     topiramate (TOPAMAX) 25 MG capsule Take 25 mg by mouth 2 (two) times daily.     No current facility-administered medications for this visit.    Medication Side Effects: None  Allergies: No Known Allergies  Past Medical History:  Diagnosis Date   Depression    High cholesterol    History of broken nose    History of pneumonia 1998   Hx of colonoscopy 2013   Hypertension    Injury of pelvis 1997   Sleep apnea     Family History  Problem Relation Age of Onset   Lymphoma Mother    Bipolar disorder Son    Heart disease Other  multiple    Social History   Socioeconomic History   Marital status: Married    Spouse name: Not on file   Number of children: Not on file   Years of education: Not on file   Highest education level: Not on file  Occupational History   Occupation: Art gallery manager  Tobacco Use   Smoking status: Never   Smokeless tobacco: Never  Vaping Use   Vaping Use: Never used  Substance and Sexual Activity   Alcohol use: Yes    Alcohol/week: 1.0 standard drink of alcohol    Types: 1 Standard drinks or equivalent per week    Comment: 4 drinks weekly   Drug use: No   Sexual activity: Not on  file  Other Topics Concern   Not on file  Social History Narrative   Diet: N/A   Caffeine: Yes    Married: Yes, 2016   House: House, 2 stories    Pets: 2 Dogs   Current/Past profession: Building services engineer    Exercise: Yes, daily   Living Will: No   DNR: No   POA/HPOA: No    Social Determinants of Corporate investment banker Strain: Not on file  Food Insecurity: Not on file  Transportation Needs: Not on file  Physical Activity: Not on file  Stress: Not on file  Social Connections: Not on file  Intimate Partner Violence: Not on file    Past Medical History, Surgical history, Social history, and Family history were reviewed and updated as appropriate.   Please see review of systems for further details on the patient's review from today.   Objective:   Physical Exam:  There were no vitals taken for this visit.  Physical Exam Constitutional:      General: He is not in acute distress. Musculoskeletal:        General: No deformity.  Neurological:     Mental Status: He is alert and oriented to person, place, and time.     Coordination: Coordination normal.  Psychiatric:        Attention and Perception: Attention and perception normal. He does not perceive auditory or visual hallucinations.        Mood and Affect: Mood normal. Mood is not anxious or depressed. Affect is not labile, blunt, angry or inappropriate.        Speech: Speech normal.        Behavior: Behavior normal.        Thought Content: Thought content normal. Thought content is not paranoid or delusional. Thought content does not include homicidal or suicidal ideation. Thought content does not include homicidal or suicidal plan.        Cognition and Memory: Cognition and memory normal.        Judgment: Judgment normal.     Comments: Insight intact     Lab Review:     Component Value Date/Time   NA 137 11/12/2017 0620   NA 141 05/08/2016 1630   K 3.3 (L) 11/12/2017 0620   CL 101 11/12/2017 0620    CO2 29 11/12/2017 0620   GLUCOSE 108 (H) 11/12/2017 0620   BUN 15 11/12/2017 0620   BUN 17 05/08/2016 1630   CREATININE 0.84 11/12/2017 0620   CREATININE 1.07 09/29/2017 1110   CALCIUM 8.2 (L) 11/12/2017 0620   PROT 6.1 (L) 11/11/2017 2014   PROT 7.0 05/08/2016 1630   ALBUMIN 2.9 (L) 11/11/2017 2014   ALBUMIN 4.4 05/08/2016 1630   AST 15 11/11/2017 2014  ALT 14 (L) 11/11/2017 2014   ALKPHOS 62 11/11/2017 2014   BILITOT 0.9 11/11/2017 2014   BILITOT 0.4 05/08/2016 1630   GFRNONAA >60 11/12/2017 0620   GFRNONAA 74 09/29/2017 1110   GFRAA >60 11/12/2017 0620   GFRAA 86 09/29/2017 1110       Component Value Date/Time   WBC 7.6 11/12/2017 0620   RBC 3.70 (L) 11/12/2017 0620   HGB 10.7 (L) 11/12/2017 0620   HCT 32.6 (L) 11/12/2017 0620   PLT 213 11/12/2017 0620   MCV 88.1 11/12/2017 0620   MCH 28.9 11/12/2017 0620   MCHC 32.8 11/12/2017 0620   RDW 13.4 11/12/2017 0620   LYMPHSABS 1.5 11/11/2017 2014   MONOABS 0.9 11/11/2017 2014   EOSABS 0.0 11/11/2017 2014   BASOSABS 0.0 11/11/2017 2014    No results found for: "POCLITH", "LITHIUM"   No results found for: "PHENYTOIN", "PHENOBARB", "VALPROATE", "CBMZ"   .res Assessment: Plan:    Plan:  Continue Lexapro 10mg  daily   RTC 6 months  Patient advised to contact office with any questions, adverse effects, or acute worsening in signs and symptoms.  There are no diagnoses linked to this encounter.   Please see After Visit Summary for patient specific instructions.  Future Appointments  Date Time Provider Department Center  04/29/2023  2:40 PM Priyah Schmuck, Thereasa Solo, NP [facility] None    No orders of the defined types were placed in this encounter.     -------------------------------

## 2023-07-19 ENCOUNTER — Encounter: Payer: Self-pay | Admitting: Pulmonary Disease

## 2023-07-19 ENCOUNTER — Telehealth (INDEPENDENT_AMBULATORY_CARE_PROVIDER_SITE_OTHER): Payer: Medicare Other | Admitting: Pulmonary Disease

## 2023-07-19 DIAGNOSIS — G4733 Obstructive sleep apnea (adult) (pediatric): Secondary | ICD-10-CM | POA: Diagnosis not present

## 2023-07-19 NOTE — Patient Instructions (Signed)
Compliance from the machine shows it is working well  Call us with significant concerns  Follow-up in a year

## 2023-07-19 NOTE — Progress Notes (Unsigned)
Virtual Visit via Video Note  I connected with Felipa Eth on 07/19/23 at  9:15 AM EDT by a video enabled telemedicine application and verified that I am speaking with the correct person using two identifiers.  Location: Patient: Patient was at home Provider: [address] in office   I discussed the limitations of evaluation and management by telemedicine and the availability of in person appointments. The patient expressed understanding and agreed to proceed.  History of Present Illness: Patient with a history of obstructive sleep apnea  Just recently received an updated machine  Machine has been working well Sleeping well Waking up feeling like he is at a good nights rest Staying active during the day   Observations/Objective: Looks well Has no concerns today  CPAP compliance reviewed showing 100% compliance with CPAP Average use of 9 hours 16 minutes CPAP pressure of 10 Residual AHI of 2  Assessment and Plan: Optimal treatment of obstructive sleep apnea  Continue using CPAP nightly  Call us with significant concerns    Follow Up Instructions:  Follow-up a year from now  Call us with any significant issues with his CPAP    I discussed the assessment and treatment plan with the patient. The patient was provided an opportunity to ask questions and all were answered. The patient agreed with the plan and demonstrated an understanding of the instructions.   The patient was advised to call back or seek an in-person evaluation if the symptoms worsen or if the condition fails to improve as anticipated.  I provided 20 minutes of non-face-to-face time during this encounter.   Tomma Lightning, MD

## 2023-10-26 ENCOUNTER — Ambulatory Visit: Payer: Medicare Other | Admitting: Adult Health

## 2023-11-02 ENCOUNTER — Encounter: Payer: Self-pay | Admitting: Adult Health

## 2023-11-02 ENCOUNTER — Ambulatory Visit (INDEPENDENT_AMBULATORY_CARE_PROVIDER_SITE_OTHER): Payer: Medicare Other | Admitting: Adult Health

## 2023-11-02 DIAGNOSIS — F32A Depression, unspecified: Secondary | ICD-10-CM

## 2023-11-02 DIAGNOSIS — F411 Generalized anxiety disorder: Secondary | ICD-10-CM

## 2023-11-02 NOTE — Progress Notes (Signed)
[name] [id number] 10-21-1956 67 y.o.  Subjective:   Patient ID:  Jason Ingram is a 67 y.o. (DOB 1955-12-13) male.  Chief Complaint: No chief complaint on file.  HPI Jason Ingram presents to the office today for follow-up of depression and anxiety.  Describes mood today as "ok". Pleasant. Mood symptoms - denies depression, anxiety, and irritability. Denies panic attacks. Denies worry, rumination, and over thinking. Mood is consistent. Stating "I feel like I'm doing good". Feels like medication continues to work well. Stable interest and motivation. Energy levels stable. Active, does not have a regular exercise routine.  Enjoys some usual interests and activities. Lives alone. Dating - has a girlfriend of 2 years.  Has 2 grown children.  Appetite adequate. Weight gain 350 to 360 pounds.   Sleeps well most nights. Averages 8 hours. Focus and concentration stable. Completing tasks. Managing aspects of household. Working full time. Denies SI or HI.  Denies AH or VH. Denies self harm. Denies substance use.  Previous medications: Paxil, Zoloft    PHQ2-9    Flowsheet Row Office Visit from 09/29/2017 in [facility] Office Visit from 01/31/2016 in [facility] Office Visit from 10/09/2015 in Primary Care at [facility] Total Score 0 0 0      Flowsheet Row Admission (Discharged) from 12/10/2022 in [facility]  C-SSRS RISK CATEGORY No Risk        Review of Systems:  Review of Systems  Musculoskeletal:  Negative for gait problem.  Neurological:  Negative for tremors.  Psychiatric/Behavioral:         Please refer to HPI    Medications: I have reviewed the patient's current medications.  Current Outpatient Medications  Medication Sig Dispense Refill   amLODipine (NORVASC) 5 MG tablet Take 5 mg by mouth daily.     escitalopram (LEXAPRO) 10 MG tablet Take 1 tablet (10 mg total) by mouth daily. 90  tablet 3   finasteride (PROSCAR) 5 MG tablet Take 1 tablet (5 mg total) by mouth daily. 30 tablet 1   lisinopril (ZESTRIL) 10 MG tablet Take 10 mg by mouth daily.     oxybutynin (DITROPAN-XL) 10 MG 24 hr tablet Take 10 mg by mouth at bedtime.     tamsulosin (FLOMAX) 0.4 MG CAPS capsule Take 0.4 mg daily after breakfast by mouth.     topiramate (TOPAMAX) 25 MG capsule Take 25 mg by mouth 2 (two) times daily.     No current facility-administered medications for this visit.    Medication Side Effects: None  Allergies: No Known Allergies  Past Medical History:  Diagnosis Date   Depression    High cholesterol    History of broken nose    History of pneumonia 1998   Hx of colonoscopy 2013   Hypertension    Injury of pelvis 1997   Sleep apnea     Past Medical History, Surgical history, Social history, and Family history were reviewed and updated as appropriate.   Please see review of systems for further details on the patient's review from today.   Objective:   Physical Exam:  There were no vitals taken for this visit.  Physical Exam Constitutional:      General: He is not in acute distress. Musculoskeletal:        General: No deformity.  Neurological:     Mental Status: He is alert and oriented to person, place, and time.  Coordination: Coordination normal.  Psychiatric:        Attention and Perception: Attention and perception normal. He does not perceive auditory or visual hallucinations.        Mood and Affect: Affect is not labile, blunt, angry or inappropriate.        Speech: Speech normal.        Behavior: Behavior normal.        Thought Content: Thought content normal. Thought content is not paranoid or delusional. Thought content does not include homicidal or suicidal ideation. Thought content does not include homicidal or suicidal plan.        Cognition and Memory: Cognition and memory normal.        Judgment: Judgment normal.     Comments: Insight intact      Lab Review:     Component Value Date/Time   NA 137 11/12/2017 0620   NA 141 05/08/2016 1630   K 3.3 (L) 11/12/2017 0620   CL 101 11/12/2017 0620   CO2 29 11/12/2017 0620   GLUCOSE 108 (H) 11/12/2017 0620   BUN 15 11/12/2017 0620   BUN 17 05/08/2016 1630   CREATININE 0.84 11/12/2017 0620   CREATININE 1.07 09/29/2017 1110   CALCIUM 8.2 (L) 11/12/2017 0620   PROT 6.1 (L) 11/11/2017 2014   PROT 7.0 05/08/2016 1630   ALBUMIN 2.9 (L) 11/11/2017 2014   ALBUMIN 4.4 05/08/2016 1630   AST 15 11/11/2017 2014   ALT 14 (L) 11/11/2017 2014   ALKPHOS 62 11/11/2017 2014   BILITOT 0.9 11/11/2017 2014   BILITOT 0.4 05/08/2016 1630   GFRNONAA >60 11/12/2017 0620   GFRNONAA 74 09/29/2017 1110   GFRAA >60 11/12/2017 0620   GFRAA 86 09/29/2017 1110       Component Value Date/Time   WBC 7.6 11/12/2017 0620   RBC 3.70 (L) 11/12/2017 0620   HGB 10.7 (L) 11/12/2017 0620   HCT 32.6 (L) 11/12/2017 0620   PLT 213 11/12/2017 0620   MCV 88.1 11/12/2017 0620   MCH 28.9 11/12/2017 0620   MCHC 32.8 11/12/2017 0620   RDW 13.4 11/12/2017 0620   LYMPHSABS 1.5 11/11/2017 2014   MONOABS 0.9 11/11/2017 2014   EOSABS 0.0 11/11/2017 2014   BASOSABS 0.0 11/11/2017 2014    No results found for: "POCLITH", "LITHIUM"   No results found for: "PHENYTOIN", "PHENOBARB", "VALPROATE", "CBMZ"   .res Assessment: Plan:    Plan:  Continue Lexapro 10mg  daily   RTC 6 months  Patient advised to contact office with any questions, adverse effects, or acute worsening in signs and symptoms.  There are no diagnoses linked to this encounter.   Please see After Visit Summary for patient specific instructions.  No future appointments.   No orders of the defined types were placed in this encounter.   -------------------------------

## 2024-07-03 ENCOUNTER — Other Ambulatory Visit: Payer: Self-pay

## 2024-07-03 DIAGNOSIS — F411 Generalized anxiety disorder: Secondary | ICD-10-CM

## 2024-07-03 DIAGNOSIS — F32A Depression, unspecified: Secondary | ICD-10-CM

## 2024-07-03 MED ORDER — ESCITALOPRAM OXALATE 10 MG PO TABS: 10.0000 mg | ORAL_TABLET | Freq: Every day | ORAL | 2 refills | Status: AC

## 2025-01-02 ENCOUNTER — Other Ambulatory Visit: Payer: Self-pay | Admitting: Adult Health

## 2025-01-02 DIAGNOSIS — F32A Depression, unspecified: Secondary | ICD-10-CM

## 2025-01-02 DIAGNOSIS — F411 Generalized anxiety disorder: Secondary | ICD-10-CM

## 2025-01-02 NOTE — Telephone Encounter (Signed)
 Please call pt to schedule his next appt. He was last seen 10/2023 and was to return in 1 year

## 2025-01-03 NOTE — Telephone Encounter (Signed)
 Pt is scheduled for 01/23/25

## 2025-01-23 ENCOUNTER — Ambulatory Visit: Admitting: Adult Health
